# Patient Record
Sex: Female | Born: 1985
Health system: Southern US, Community
[De-identification: ages and names within clinical notes are randomized; demographics above are authoritative.]

## PROBLEM LIST (undated history)

## (undated) DIAGNOSIS — R519 Headache, unspecified: Secondary | ICD-10-CM

## (undated) DIAGNOSIS — R87629 Unspecified abnormal cytological findings in specimens from vagina: Secondary | ICD-10-CM

## (undated) DIAGNOSIS — M1990 Unspecified osteoarthritis, unspecified site: Secondary | ICD-10-CM

## (undated) DIAGNOSIS — F419 Anxiety disorder, unspecified: Secondary | ICD-10-CM

## (undated) HISTORY — DX: Unspecified abnormal cytological findings in specimens from vagina: R87.629

## (undated) HISTORY — DX: Headache, unspecified: R51.9

---

## 2012-08-19 HISTORY — PX: OTHER SURGICAL HISTORY: SHX169

## 2012-08-19 HISTORY — PX: SHOULDER SURGERY: SHX246

## 2013-06-16 DIAGNOSIS — S72009A Fracture of unspecified part of neck of unspecified femur, initial encounter for closed fracture: Secondary | ICD-10-CM | POA: Insufficient documentation

## 2013-06-16 HISTORY — DX: Fracture of unspecified part of neck of unspecified femur, initial encounter for closed fracture: S72.009A

## 2013-06-23 DIAGNOSIS — I82629 Acute embolism and thrombosis of deep veins of unspecified upper extremity: Secondary | ICD-10-CM | POA: Insufficient documentation

## 2014-08-19 NOTE — L&D Delivery Note (Signed)
Delivery Note  SVD viable female Apgars 9,9 over 2nd deg ML laceration.  Placenta delivered spontaneously intact with 3VC. Repair with 2-0 Chromic with good support and hemostasis noted and R/V exam confirms.  PH art was sent .  [place] cord blood was not done.  Mother and baby were doing well.  EBL 250cc  Candice Campavid Kepler Mccabe, MD

## 2015-01-03 LAB — OB RESULTS CONSOLE ANTIBODY SCREEN: Antibody Screen: NEGATIVE

## 2015-01-03 LAB — OB RESULTS CONSOLE RUBELLA ANTIBODY, IGM: Rubella: IMMUNE

## 2015-01-03 LAB — OB RESULTS CONSOLE GC/CHLAMYDIA
Chlamydia: NEGATIVE
Gonorrhea: NEGATIVE

## 2015-01-03 LAB — OB RESULTS CONSOLE HIV ANTIBODY (ROUTINE TESTING): HIV: NONREACTIVE

## 2015-01-03 LAB — OB RESULTS CONSOLE HEPATITIS B SURFACE ANTIGEN: Hepatitis B Surface Ag: NEGATIVE

## 2015-01-03 LAB — OB RESULTS CONSOLE ABO/RH: RH TYPE: NEGATIVE

## 2015-01-03 LAB — OB RESULTS CONSOLE RPR: RPR: NONREACTIVE

## 2015-07-11 ENCOUNTER — Inpatient Hospital Stay (HOSPITAL_COMMUNITY)
Admission: AD | Admit: 2015-07-11 | Discharge: 2015-07-12 | DRG: 775 | Disposition: A | Discharge status: Home / Self Care | Payer: 59 | Source: Ambulatory Visit | Attending: Obstetrics and Gynecology | Admitting: Obstetrics and Gynecology

## 2015-07-11 ENCOUNTER — Encounter (HOSPITAL_COMMUNITY): Payer: Self-pay | Admitting: *Deleted

## 2015-07-11 DIAGNOSIS — M533 Sacrococcygeal disorders, not elsewhere classified: Secondary | ICD-10-CM | POA: Diagnosis present

## 2015-07-11 DIAGNOSIS — Z3A39 39 weeks gestation of pregnancy: Secondary | ICD-10-CM

## 2015-07-11 DIAGNOSIS — IMO0001 Reserved for inherently not codable concepts without codable children: Secondary | ICD-10-CM

## 2015-07-11 LAB — CBC
HEMATOCRIT: 31.9 % — AB (ref 36.0–46.0)
HEMOGLOBIN: 10.7 g/dL — AB (ref 12.0–15.0)
MCH: 29.2 pg (ref 26.0–34.0)
MCHC: 33.5 g/dL (ref 30.0–36.0)
MCV: 87.2 fL (ref 78.0–100.0)
Platelets: 229 10*3/uL (ref 150–400)
RBC: 3.66 MIL/uL — AB (ref 3.87–5.11)
RDW: 14.5 % (ref 11.5–15.5)
WBC: 8.3 10*3/uL (ref 4.0–10.5)

## 2015-07-11 LAB — RPR: RPR Ser Ql: NONREACTIVE

## 2015-07-11 MED ORDER — OXYCODONE-ACETAMINOPHEN 5-325 MG PO TABS: 2.0000 | ORAL_TABLET | ORAL | Status: DC | PRN

## 2015-07-11 MED ORDER — TETANUS-DIPHTH-ACELL PERTUSSIS 5-2.5-18.5 LF-MCG/0.5 IM SUSP: 0.5000 mL | Freq: Once | INTRAMUSCULAR | Status: DC

## 2015-07-11 MED ORDER — DIBUCAINE 1 % RE OINT
1.0000 "application " | TOPICAL_OINTMENT | RECTAL | Status: DC | PRN
  Administered 2015-07-11: 1 via RECTAL
  Filled 2015-07-11: qty 28

## 2015-07-11 MED ORDER — OXYTOCIN BOLUS FROM INFUSION: 500.0000 mL | INTRAVENOUS | Status: DC

## 2015-07-11 MED ORDER — OXYTOCIN 40 UNITS IN LACTATED RINGERS INFUSION - SIMPLE MED
62.5000 mL/h | INTRAVENOUS | Status: DC
  Filled 2015-07-11: qty 1000

## 2015-07-11 MED ORDER — WITCH HAZEL-GLYCERIN EX PADS
1.0000 "application " | MEDICATED_PAD | CUTANEOUS | Status: DC | PRN
  Administered 2015-07-11: 1 via TOPICAL

## 2015-07-11 MED ORDER — SIMETHICONE 80 MG PO CHEW: 80.0000 mg | CHEWABLE_TABLET | ORAL | Status: DC | PRN

## 2015-07-11 MED ORDER — LACTATED RINGERS IV SOLN: 500.0000 mL | INTRAVENOUS | Status: DC | PRN

## 2015-07-11 MED ORDER — LACTATED RINGERS IV SOLN: INTRAVENOUS | Status: DC

## 2015-07-11 MED ORDER — CITRIC ACID-SODIUM CITRATE 334-500 MG/5ML PO SOLN
30.0000 mL | ORAL | Status: DC | PRN
  Administered 2015-07-11: 30 mL via ORAL
  Filled 2015-07-11: qty 15

## 2015-07-11 MED ORDER — FLEET ENEMA 7-19 GM/118ML RE ENEM: 1.0000 | ENEMA | RECTAL | Status: DC | PRN

## 2015-07-11 MED ORDER — LIDOCAINE HCL (PF) 1 % IJ SOLN
30.0000 mL | INTRAMUSCULAR | Status: DC | PRN
  Administered 2015-07-11: 30 mL via SUBCUTANEOUS
  Filled 2015-07-11: qty 30

## 2015-07-11 MED ORDER — LANOLIN HYDROUS EX OINT: TOPICAL_OINTMENT | CUTANEOUS | Status: DC | PRN

## 2015-07-11 MED ORDER — NALOXONE HCL 0.4 MG/ML IJ SOLN
INTRAMUSCULAR | Status: AC
  Filled 2015-07-11: qty 1

## 2015-07-11 MED ORDER — DIPHENHYDRAMINE HCL 25 MG PO CAPS: 25.0000 mg | ORAL_CAPSULE | Freq: Four times a day (QID) | ORAL | Status: DC | PRN

## 2015-07-11 MED ORDER — PHENYLEPHRINE 40 MCG/ML (10ML) SYRINGE FOR IV PUSH (FOR BLOOD PRESSURE SUPPORT)
PREFILLED_SYRINGE | INTRAVENOUS | Status: AC
  Filled 2015-07-11: qty 20

## 2015-07-11 MED ORDER — BENZOCAINE-MENTHOL 20-0.5 % EX AERO
1.0000 "application " | INHALATION_SPRAY | CUTANEOUS | Status: DC | PRN
  Administered 2015-07-11: 1 via TOPICAL
  Filled 2015-07-11: qty 56

## 2015-07-11 MED ORDER — SENNOSIDES-DOCUSATE SODIUM 8.6-50 MG PO TABS
2.0000 | ORAL_TABLET | ORAL | Status: DC
  Administered 2015-07-12: 2 via ORAL
  Filled 2015-07-11: qty 2

## 2015-07-11 MED ORDER — OXYCODONE-ACETAMINOPHEN 5-325 MG PO TABS
1.0000 | ORAL_TABLET | ORAL | Status: DC | PRN
  Administered 2015-07-11 – 2015-07-12 (×3): 1 via ORAL
  Filled 2015-07-11 (×3): qty 1

## 2015-07-11 MED ORDER — IBUPROFEN 600 MG PO TABS
600.0000 mg | ORAL_TABLET | Freq: Four times a day (QID) | ORAL | Status: DC
  Administered 2015-07-11 – 2015-07-12 (×5): 600 mg via ORAL
  Filled 2015-07-11 (×5): qty 1

## 2015-07-11 MED ORDER — ACETAMINOPHEN 325 MG PO TABS: 650.0000 mg | ORAL_TABLET | ORAL | Status: DC | PRN

## 2015-07-11 MED ORDER — FENTANYL 2.5 MCG/ML BUPIVACAINE 1/10 % EPIDURAL INFUSION (WH - ANES)
INTRAMUSCULAR | Status: AC
  Filled 2015-07-11: qty 125

## 2015-07-11 MED ORDER — PRENATAL MULTIVITAMIN CH
1.0000 | ORAL_TABLET | Freq: Every day | ORAL | Status: DC
  Administered 2015-07-11 – 2015-07-12 (×2): 1 via ORAL
  Filled 2015-07-11 (×2): qty 1

## 2015-07-11 MED ORDER — MEDROXYPROGESTERONE ACETATE 150 MG/ML IM SUSP: 150.0000 mg | INTRAMUSCULAR | Status: DC | PRN

## 2015-07-11 MED ORDER — MEASLES, MUMPS & RUBELLA VAC ~~LOC~~ INJ
0.5000 mL | INJECTION | Freq: Once | SUBCUTANEOUS | Status: DC
  Filled 2015-07-11: qty 0.5

## 2015-07-11 MED ORDER — OXYCODONE-ACETAMINOPHEN 5-325 MG PO TABS: 1.0000 | ORAL_TABLET | ORAL | Status: DC | PRN

## 2015-07-11 MED ORDER — ONDANSETRON HCL 4 MG/2ML IJ SOLN: 4.0000 mg | Freq: Four times a day (QID) | INTRAMUSCULAR | Status: DC | PRN

## 2015-07-11 MED ORDER — ONDANSETRON HCL 4 MG/2ML IJ SOLN: 4.0000 mg | INTRAMUSCULAR | Status: DC | PRN

## 2015-07-11 MED ORDER — ONDANSETRON HCL 4 MG PO TABS: 4.0000 mg | ORAL_TABLET | ORAL | Status: DC | PRN

## 2015-07-11 MED ORDER — BUTORPHANOL TARTRATE 1 MG/ML IJ SOLN
2.0000 mg | Freq: Once | INTRAMUSCULAR | Status: AC
  Administered 2015-07-11: 2 mg via INTRAVENOUS
  Filled 2015-07-11 (×2): qty 2

## 2015-07-11 MED ORDER — ZOLPIDEM TARTRATE 5 MG PO TABS: 5.0000 mg | ORAL_TABLET | Freq: Every evening | ORAL | Status: DC | PRN

## 2015-07-11 NOTE — H&P (Signed)
Tiffany Leonard is a 29 y.o. female presenting for SROM and labor.  8 cm in triage to L&D.  Preg uncomplicated.  GBS-. History OB History    Gravida Para Term Preterm AB TAB SAB Ectopic Multiple Living   2              History reviewed. No pertinent past medical history. History reviewed. No pertinent past surgical history. Family History: family history is not on file. Social History:  has no tobacco, alcohol, and drug history on file.   Prenatal Transfer Tool  Maternal Diabetes: No Genetic Screening: Normal Maternal Ultrasounds/Referrals: Normal Fetal Ultrasounds or other Referrals:  None Maternal Substance Abuse:  No Significant Maternal Medications:  None Significant Maternal Lab Results:  None Other Comments:  None  ROS  Dilation: 10 Effacement (%): 100 Station: 0 Exam by:: Freddi CheMelissa Merrill, RN Blood pressure 129/86, pulse 98, temperature 98.8 F (37.1 C), temperature source Oral, resp. rate 20. Exam Physical Exam  Prenatal labs: ABO, Rh: --/--/AB NEG (11/22 0430) Antibody: POS (11/22 0430) Rubella: Immune (05/17 0000) RPR: Nonreactive (05/17 0000)  HBsAg: Negative (05/17 0000)  HIV: Non-reactive (05/17 0000)  GBS:     Assessment/Plan: IUP at term Active labor Anticipate SVD   [name] 07/11/2015, 6:42 AM

## 2015-07-11 NOTE — Lactation Note (Signed)
This note was copied from the chart of Tiffany Leonard. Lactation Consultation Note  Patient Name: Tiffany Leonard RUEAV'WToday's Date: 07/11/2015 Reason for consult: Initial assessment   With this first time om and term baby. Baby now 7 hours old, and mom states she has successfully breast fed at least once. The baby was wrapped and asleepin his crib, and mom said she was getting ready to nap, but her grandparents were visiting at this time. Some basic breast feeding teaching done with mom, and I asked that she call when baby next shows feeding cues, to I can observe a latch.   Maternal Data Formula Feeding for Exclusion: No Has patient been taught Hand Expression?: Yes Does the patient have breastfeeding experience prior to this delivery?: No  Feeding Feeding Type: Breast Fed Length of feed: 20 min  LATCH Score/Interventions                      Lactation Tools Discussed/Used     Consult Status Consult Status: Follow-up Date: 07/11/15 Follow-up type: In-patient    Alfred LevinsLee, Jorey Dollard Anne 07/11/2015, 1:47 PM

## 2015-07-11 NOTE — MAU Note (Signed)
PT  SAYS SROM   AT 0345.  WAS  2  CM IN  OFFICE,  DENIES HSV AND MRSA.    GBS- NEG

## 2015-07-12 LAB — CBC
HCT: 25.4 % — ABNORMAL LOW (ref 36.0–46.0)
HEMOGLOBIN: 8.5 g/dL — AB (ref 12.0–15.0)
MCH: 29.5 pg (ref 26.0–34.0)
MCHC: 33.5 g/dL (ref 30.0–36.0)
MCV: 88.2 fL (ref 78.0–100.0)
Platelets: 202 10*3/uL (ref 150–400)
RBC: 2.88 MIL/uL — ABNORMAL LOW (ref 3.87–5.11)
RDW: 14.6 % (ref 11.5–15.5)
WBC: 10 10*3/uL (ref 4.0–10.5)

## 2015-07-12 MED ORDER — IBUPROFEN 600 MG PO TABS: 600.0000 mg | ORAL_TABLET | Freq: Four times a day (QID) | ORAL | Status: DC

## 2015-07-12 MED ORDER — OXYCODONE-ACETAMINOPHEN 5-325 MG PO TABS: 1.0000 | ORAL_TABLET | ORAL | Status: DC | PRN

## 2015-07-12 NOTE — Lactation Note (Signed)
This note was copied from the chart of Tiffany Leonard. Lactation Consultation Note  Patient Name: Tiffany Leonard ZOXWR'UToday's Date: 07/12/2015 Reason for consult: Follow-up assessment;Pump rental   [facility]. Handout on Marijuana in Pregnancy and Breastfeeding given to mom and discussed detriment to infant who receives Marijuana by inhalation or BM. Mom voiced understanding. Infant did have a + UDS for Barbituates, Spoke with SW who was aware, it is noted that mom received Furocet prior to delivery, hence the + drug screen. Enc mom to call with questions/concerns. Infant with F/U Ped appt Friday and WIC on Monday.    Maternal Data Formula Feeding for Exclusion: No Has patient been taught Hand Expression?: Yes  Feeding    LATCH Score/Interventions                      Lactation Tools Discussed/Used WIC Program: Yes Pump Review: Setup, frequency, and cleaning;Milk Storage   Consult Status Consult Status: Complete Follow-up type: Call as needed    Ed BlalockSharon S Daniel Ritthaler 07/12/2015, 12:10 PM

## 2015-07-12 NOTE — Discharge Summary (Signed)
Obstetric Discharge Summary Reason for Admission: onset of labor and rupture of membranes Prenatal Procedures: ultrasound Intrapartum Procedures: spontaneous vaginal delivery Postpartum Procedures: none Complications-Operative and Postpartum: 2 degree perineal laceration HEMOGLOBIN  Date Value Ref Range Status  07/12/2015 8.5* 12.0 - 15.0 Leonard/dL Final    Comment:    DELTA CHECK NOTED REPEATED TO VERIFY SPECIMEN CHECKED FOR CLOTS    HCT  Date Value Ref Range Status  07/12/2015 25.4* 36.0 - 46.0 % Final    Physical Exam:  General: alert and cooperative, complains of coccygeal pain Lochia: appropriate Uterine Fundus: firm Incision: healing well DVT Evaluation: No evidence of DVT seen on physical exam. Negative Homan's sign. No cords or calf tenderness. No significant calf/ankle edema. No ecchymosis or erythema observed at coccyx Discharge Diagnoses: Term Pregnancy-delivered  Discharge Information: Date: 07/12/2015 Activity: pelvic rest Diet: routine Medications: PNV, Ibuprofen and Percocet Condition: stable Instructions: refer to practice specific booklet Discharge to: home   Newborn Data: Live born female  Birth Weight: 7 lb 15 oz (3600 Leonard) APGAR: 9, 9  Home with mother.  Tiffany Leonard 07/12/2015, 8:56 AM

## 2015-07-12 NOTE — Lactation Note (Signed)
This note was copied from the chart of Tiffany Leonard. Lactation Consultation Note  Patient Name: Tiffany Leonard HQION'GToday's Date: 07/12/2015 Reason for consult: Difficult latch MD requested latch help with the R breast. Mom does have a compression stripe on the top edge of the R nipple. She was attempting a cradle position and baby was getting fussy. Moved baby to a more sitting up football like position and he latched more easily until he fell asleep. Reviewed baby behavior and stressed that more practice will make this better. Mom declined making an OP apt today. She stated that she will call if she has problems. Discussed nipple care, breast changes, and feeding frequency. She does have a DEBP at home.    Maternal Data    Feeding Feeding Type: Breast Fed Length of feed: 15 min (on and off)  LATCH Score/Interventions                      Lactation Tools Discussed/Used     Consult Status Consult Status: Complete Date: 07/12/15 Follow-up type: Call as needed    Rulon Eisenmengerlizabeth E Orlander [place] 07/12/2015, 4:25 PM

## 2015-07-12 NOTE — Lactation Note (Signed)
This note was copied from the chart of Tiffany Leonard. Lactation Consultation Note  Patient Name: Tiffany Leonard WJXBJ'YToday's Date: 07/12/2015 Reason for consult: Follow-up assessment   Follow up with mom of 28 hour old infant. Infant with 7 BF for 15-50 minutes, 4 BF attempts, 3 voids, and 3 stools in last 24 hours. Latch scores of 7-8. Infant has F/U with Ped on Friday. Infant circed this am and currently sound asleep. Discussed milk coming to volume, feeding 8-12 x in 24 hours, maintaining I/O record and taking to Ped visit, Engorgement prevention. Mom reports he has more trouble latching onto right breasts but is improving. [facility] Brochure reviewed including Phone #, OP Services, and Support Groups. Mom has a DEBP at home for prn use. Enc mom to call with questions/concerns.   Maternal Data Formula Feeding for Exclusion: No Has patient been taught Hand Expression?: Yes  Feeding    LATCH Score/Interventions                      Lactation Tools Discussed/Used WIC Program: No   Consult Status Consult Status: PRN Follow-up type: Call as needed    Ed BlalockSharon S Oreste Majeed 07/12/2015, 10:49 AM

## 2015-07-15 LAB — TYPE AND SCREEN
ABO/RH(D): AB NEG
Antibody Screen: POSITIVE
DAT, IGG: NEGATIVE
UNIT DIVISION: 0
UNIT DIVISION: 0

## 2015-07-28 ENCOUNTER — Ambulatory Visit (HOSPITAL_COMMUNITY)
Admission: RE | Admit: 2015-07-28 | Discharge: 2015-07-28 | Disposition: A | Discharge status: Home / Self Care | Payer: 59 | Source: Ambulatory Visit | Attending: Obstetrics & Gynecology | Admitting: Obstetrics & Gynecology

## 2018-11-12 DIAGNOSIS — Z01419 Encounter for gynecological examination (general) (routine) without abnormal findings: Secondary | ICD-10-CM | POA: Diagnosis not present

## 2018-11-12 DIAGNOSIS — M25551 Pain in right hip: Secondary | ICD-10-CM | POA: Diagnosis not present

## 2018-11-12 DIAGNOSIS — F419 Anxiety disorder, unspecified: Secondary | ICD-10-CM | POA: Diagnosis not present

## 2018-11-12 DIAGNOSIS — Z6826 Body mass index (BMI) 26.0-26.9, adult: Secondary | ICD-10-CM | POA: Diagnosis not present

## 2018-12-24 DIAGNOSIS — M5441 Lumbago with sciatica, right side: Secondary | ICD-10-CM | POA: Diagnosis not present

## 2018-12-24 DIAGNOSIS — M545 Low back pain: Secondary | ICD-10-CM | POA: Diagnosis not present

## 2019-01-22 DIAGNOSIS — Z30432 Encounter for removal of intrauterine contraceptive device: Secondary | ICD-10-CM | POA: Diagnosis not present

## 2019-02-04 DIAGNOSIS — M25551 Pain in right hip: Secondary | ICD-10-CM | POA: Diagnosis not present

## 2019-02-04 DIAGNOSIS — M25559 Pain in unspecified hip: Secondary | ICD-10-CM | POA: Diagnosis not present

## 2019-02-10 ENCOUNTER — Other Ambulatory Visit (HOSPITAL_COMMUNITY): Payer: Self-pay | Admitting: Orthopedic Surgery

## 2019-02-10 ENCOUNTER — Other Ambulatory Visit: Payer: Self-pay | Admitting: Orthopedic Surgery

## 2019-02-10 DIAGNOSIS — R102 Pelvic and perineal pain: Secondary | ICD-10-CM | POA: Diagnosis not present

## 2019-02-10 DIAGNOSIS — M85459 Solitary bone cyst, unspecified pelvis: Secondary | ICD-10-CM | POA: Diagnosis not present

## 2019-02-15 ENCOUNTER — Ambulatory Visit (HOSPITAL_COMMUNITY)
Admission: RE | Admit: 2019-02-15 | Discharge: 2019-02-15 | Disposition: A | Discharge status: Home / Self Care | Payer: BLUE CROSS/BLUE SHIELD | Source: Ambulatory Visit | Attending: Orthopedic Surgery | Admitting: Orthopedic Surgery

## 2019-02-15 ENCOUNTER — Other Ambulatory Visit: Payer: Self-pay

## 2019-02-15 DIAGNOSIS — R102 Pelvic and perineal pain: Secondary | ICD-10-CM | POA: Diagnosis not present

## 2019-02-15 DIAGNOSIS — M85459 Solitary bone cyst, unspecified pelvis: Secondary | ICD-10-CM | POA: Diagnosis not present

## 2019-02-15 MED ORDER — IOHEXOL 300 MG/ML  SOLN
100.0000 mL | Freq: Once | INTRAMUSCULAR | Status: AC | PRN
  Administered 2019-02-15: 100 mL via INTRAVENOUS

## 2019-02-18 DIAGNOSIS — M25551 Pain in right hip: Secondary | ICD-10-CM | POA: Diagnosis not present

## 2019-03-22 DIAGNOSIS — M533 Sacrococcygeal disorders, not elsewhere classified: Secondary | ICD-10-CM | POA: Diagnosis not present

## 2019-05-07 DIAGNOSIS — N911 Secondary amenorrhea: Secondary | ICD-10-CM | POA: Diagnosis not present

## 2019-05-13 DIAGNOSIS — Z3685 Encounter for antenatal screening for Streptococcus B: Secondary | ICD-10-CM | POA: Diagnosis not present

## 2019-05-13 DIAGNOSIS — Z3481 Encounter for supervision of other normal pregnancy, first trimester: Secondary | ICD-10-CM | POA: Diagnosis not present

## 2019-05-13 LAB — OB RESULTS CONSOLE RPR: RPR: NONREACTIVE

## 2019-05-13 LAB — OB RESULTS CONSOLE ABO/RH: RH Type: NEGATIVE

## 2019-05-13 LAB — OB RESULTS CONSOLE HIV ANTIBODY (ROUTINE TESTING): HIV: NONREACTIVE

## 2019-05-13 LAB — OB RESULTS CONSOLE RUBELLA ANTIBODY, IGM: Rubella: IMMUNE

## 2019-05-13 LAB — OB RESULTS CONSOLE HEPATITIS B SURFACE ANTIGEN: Hepatitis B Surface Ag: NEGATIVE

## 2019-05-24 DIAGNOSIS — Z3400 Encounter for supervision of normal first pregnancy, unspecified trimester: Secondary | ICD-10-CM | POA: Diagnosis not present

## 2019-05-24 DIAGNOSIS — Z23 Encounter for immunization: Secondary | ICD-10-CM | POA: Diagnosis not present

## 2019-05-24 DIAGNOSIS — Z113 Encounter for screening for infections with a predominantly sexual mode of transmission: Secondary | ICD-10-CM | POA: Diagnosis not present

## 2019-06-07 DIAGNOSIS — Z36 Encounter for antenatal screening for chromosomal anomalies: Secondary | ICD-10-CM | POA: Diagnosis not present

## 2019-06-07 DIAGNOSIS — Z3A12 12 weeks gestation of pregnancy: Secondary | ICD-10-CM | POA: Diagnosis not present

## 2019-06-07 DIAGNOSIS — Z3682 Encounter for antenatal screening for nuchal translucency: Secondary | ICD-10-CM | POA: Diagnosis not present

## 2019-07-09 DIAGNOSIS — Z3A17 17 weeks gestation of pregnancy: Secondary | ICD-10-CM | POA: Diagnosis not present

## 2019-07-09 DIAGNOSIS — Z363 Encounter for antenatal screening for malformations: Secondary | ICD-10-CM | POA: Diagnosis not present

## 2019-08-20 NOTE — L&D Delivery Note (Signed)
Delivery Note At 6:38 PM a viable female was delivered via Vaginal, Spontaneous (Presentation:   Occiput Anterior).  APGAR: 8, 9; weight pending.   Placenta status: Spontaneous, Intact.  Cord: 3 vessels with the following complications: None.  Cord pH: n/a  Anesthesia: Local Episiotomy:  n/a Lacerations:  2nd degree Suture Repair: 3.0 vicryl rapide Est. Blood Loss (mL):  200  Mom to postpartum.  Baby to Couplet care / Skin to Skin.  Mitchel Honour 12/16/2019, 7:00 PM

## 2019-09-06 DIAGNOSIS — Z3A25 25 weeks gestation of pregnancy: Secondary | ICD-10-CM | POA: Diagnosis not present

## 2019-09-06 DIAGNOSIS — Z3480 Encounter for supervision of other normal pregnancy, unspecified trimester: Secondary | ICD-10-CM | POA: Diagnosis not present

## 2019-09-06 DIAGNOSIS — Z23 Encounter for immunization: Secondary | ICD-10-CM | POA: Diagnosis not present

## 2019-09-28 DIAGNOSIS — Z3483 Encounter for supervision of other normal pregnancy, third trimester: Secondary | ICD-10-CM | POA: Diagnosis not present

## 2019-09-28 DIAGNOSIS — Z3482 Encounter for supervision of other normal pregnancy, second trimester: Secondary | ICD-10-CM | POA: Diagnosis not present

## 2019-10-08 DIAGNOSIS — Z3480 Encounter for supervision of other normal pregnancy, unspecified trimester: Secondary | ICD-10-CM | POA: Diagnosis not present

## 2019-10-20 DIAGNOSIS — Z3480 Encounter for supervision of other normal pregnancy, unspecified trimester: Secondary | ICD-10-CM | POA: Diagnosis not present

## 2019-10-20 DIAGNOSIS — R768 Other specified abnormal immunological findings in serum: Secondary | ICD-10-CM | POA: Diagnosis not present

## 2019-10-20 LAB — OB RESULTS CONSOLE ANTIBODY SCREEN: Antibody Screen: POSITIVE

## 2019-11-25 DIAGNOSIS — R609 Edema, unspecified: Secondary | ICD-10-CM | POA: Diagnosis not present

## 2019-11-29 DIAGNOSIS — Z3685 Encounter for antenatal screening for Streptococcus B: Secondary | ICD-10-CM | POA: Diagnosis not present

## 2019-11-29 DIAGNOSIS — Z3480 Encounter for supervision of other normal pregnancy, unspecified trimester: Secondary | ICD-10-CM | POA: Diagnosis not present

## 2019-11-29 DIAGNOSIS — M5489 Other dorsalgia: Secondary | ICD-10-CM | POA: Diagnosis not present

## 2019-11-29 LAB — OB RESULTS CONSOLE GBS: GBS: NEGATIVE

## 2019-12-14 ENCOUNTER — Telehealth (HOSPITAL_COMMUNITY): Payer: Self-pay | Admitting: *Deleted

## 2019-12-14 ENCOUNTER — Encounter (HOSPITAL_COMMUNITY): Payer: Self-pay | Admitting: *Deleted

## 2019-12-14 NOTE — Telephone Encounter (Signed)
Preadmission screen  

## 2019-12-15 ENCOUNTER — Other Ambulatory Visit (HOSPITAL_COMMUNITY)
Admission: RE | Admit: 2019-12-15 | Discharge: 2019-12-15 | Disposition: A | Discharge status: Home / Self Care | Payer: BC Managed Care – PPO | Source: Ambulatory Visit | Attending: Obstetrics & Gynecology | Admitting: Obstetrics & Gynecology

## 2019-12-15 DIAGNOSIS — O99344 Other mental disorders complicating childbirth: Secondary | ICD-10-CM | POA: Diagnosis not present

## 2019-12-15 DIAGNOSIS — O26893 Other specified pregnancy related conditions, third trimester: Secondary | ICD-10-CM | POA: Diagnosis not present

## 2019-12-15 DIAGNOSIS — F419 Anxiety disorder, unspecified: Secondary | ICD-10-CM | POA: Diagnosis not present

## 2019-12-15 DIAGNOSIS — Z3403 Encounter for supervision of normal first pregnancy, third trimester: Secondary | ICD-10-CM

## 2019-12-15 DIAGNOSIS — Z3A40 40 weeks gestation of pregnancy: Secondary | ICD-10-CM | POA: Diagnosis not present

## 2019-12-15 HISTORY — DX: Encounter for supervision of normal first pregnancy, third trimester: Z34.03

## 2019-12-15 LAB — SARS CORONAVIRUS 2 (TAT 6-24 HRS): SARS Coronavirus 2: NEGATIVE

## 2019-12-16 ENCOUNTER — Inpatient Hospital Stay (HOSPITAL_COMMUNITY)
Admission: RE | Admit: 2019-12-16 | Discharge: 2019-12-17 | DRG: 807 | Disposition: A | Discharge status: Home / Self Care | Payer: BC Managed Care – PPO | Attending: Obstetrics & Gynecology | Admitting: Obstetrics & Gynecology

## 2019-12-16 ENCOUNTER — Inpatient Hospital Stay (HOSPITAL_COMMUNITY)
Admission: AD | Admit: 2019-12-16 | Payer: BC Managed Care – PPO | Source: Home / Self Care | Admitting: Obstetrics & Gynecology

## 2019-12-16 ENCOUNTER — Inpatient Hospital Stay (HOSPITAL_COMMUNITY): Payer: BC Managed Care – PPO

## 2019-12-16 ENCOUNTER — Encounter (HOSPITAL_COMMUNITY): Payer: Self-pay | Admitting: Obstetrics & Gynecology

## 2019-12-16 ENCOUNTER — Other Ambulatory Visit: Payer: Self-pay

## 2019-12-16 DIAGNOSIS — O99344 Other mental disorders complicating childbirth: Secondary | ICD-10-CM | POA: Diagnosis present

## 2019-12-16 DIAGNOSIS — Z3A40 40 weeks gestation of pregnancy: Secondary | ICD-10-CM | POA: Diagnosis not present

## 2019-12-16 DIAGNOSIS — Z3403 Encounter for supervision of normal first pregnancy, third trimester: Secondary | ICD-10-CM

## 2019-12-16 DIAGNOSIS — F419 Anxiety disorder, unspecified: Secondary | ICD-10-CM | POA: Diagnosis present

## 2019-12-16 DIAGNOSIS — Z3490 Encounter for supervision of normal pregnancy, unspecified, unspecified trimester: Secondary | ICD-10-CM

## 2019-12-16 DIAGNOSIS — O26893 Other specified pregnancy related conditions, third trimester: Secondary | ICD-10-CM | POA: Diagnosis present

## 2019-12-16 HISTORY — DX: Anxiety disorder, unspecified: F41.9

## 2019-12-16 HISTORY — DX: Encounter for supervision of normal pregnancy, unspecified, unspecified trimester: Z34.90

## 2019-12-16 LAB — CBC
HCT: 34.9 % — ABNORMAL LOW (ref 36.0–46.0)
Hemoglobin: 11.4 g/dL — ABNORMAL LOW (ref 12.0–15.0)
MCH: 28.4 pg (ref 26.0–34.0)
MCHC: 32.7 g/dL (ref 30.0–36.0)
MCV: 86.8 fL (ref 80.0–100.0)
Platelets: 238 10*3/uL (ref 150–400)
RBC: 4.02 MIL/uL (ref 3.87–5.11)
RDW: 14.6 % (ref 11.5–15.5)
WBC: 8.4 10*3/uL (ref 4.0–10.5)
nRBC: 0 % (ref 0.0–0.2)

## 2019-12-16 MED ORDER — PHENYLEPHRINE 40 MCG/ML (10ML) SYRINGE FOR IV PUSH (FOR BLOOD PRESSURE SUPPORT): 80.0000 ug | PREFILLED_SYRINGE | INTRAVENOUS | Status: DC | PRN

## 2019-12-16 MED ORDER — LACTATED RINGERS IV SOLN: INTRAVENOUS | Status: DC

## 2019-12-16 MED ORDER — OXYCODONE-ACETAMINOPHEN 5-325 MG PO TABS: 2.0000 | ORAL_TABLET | ORAL | Status: DC | PRN

## 2019-12-16 MED ORDER — ACETAMINOPHEN 325 MG PO TABS
650.0000 mg | ORAL_TABLET | ORAL | Status: DC | PRN
  Administered 2019-12-16: 15:00:00 650 mg via ORAL
  Filled 2019-12-16: qty 2

## 2019-12-16 MED ORDER — FENTANYL CITRATE (PF) 100 MCG/2ML IJ SOLN
50.0000 ug | INTRAMUSCULAR | Status: DC | PRN
  Filled 2019-12-16: qty 2

## 2019-12-16 MED ORDER — LIDOCAINE HCL (PF) 1 % IJ SOLN
30.0000 mL | INTRAMUSCULAR | Status: AC | PRN
  Administered 2019-12-16: 30 mL via SUBCUTANEOUS
  Filled 2019-12-16: qty 30

## 2019-12-16 MED ORDER — ZOLPIDEM TARTRATE 5 MG PO TABS: 5.0000 mg | ORAL_TABLET | Freq: Every evening | ORAL | Status: DC | PRN

## 2019-12-16 MED ORDER — OXYTOCIN BOLUS FROM INFUSION: 500.0000 mL | Freq: Once | INTRAVENOUS | Status: DC

## 2019-12-16 MED ORDER — EPHEDRINE 5 MG/ML INJ: 10.0000 mg | INTRAVENOUS | Status: DC | PRN

## 2019-12-16 MED ORDER — SIMETHICONE 80 MG PO CHEW: 80.0000 mg | CHEWABLE_TABLET | ORAL | Status: DC | PRN

## 2019-12-16 MED ORDER — OXYTOCIN 40 UNITS IN NORMAL SALINE INFUSION - SIMPLE MED
1.0000 m[IU]/min | INTRAVENOUS | Status: DC
  Administered 2019-12-16: 2 m[IU]/min via INTRAVENOUS
  Filled 2019-12-16: qty 1000

## 2019-12-16 MED ORDER — FENTANYL-BUPIVACAINE-NACL 0.5-0.125-0.9 MG/250ML-% EP SOLN: 12.0000 mL/h | EPIDURAL | Status: DC | PRN

## 2019-12-16 MED ORDER — IBUPROFEN 600 MG PO TABS
600.0000 mg | ORAL_TABLET | Freq: Four times a day (QID) | ORAL | Status: DC
  Administered 2019-12-16 – 2019-12-17 (×4): 600 mg via ORAL
  Filled 2019-12-16 (×4): qty 1

## 2019-12-16 MED ORDER — ONDANSETRON HCL 4 MG PO TABS: 4.0000 mg | ORAL_TABLET | ORAL | Status: DC | PRN

## 2019-12-16 MED ORDER — DIBUCAINE (PERIANAL) 1 % EX OINT: 1.0000 "application " | TOPICAL_OINTMENT | CUTANEOUS | Status: DC | PRN

## 2019-12-16 MED ORDER — SENNOSIDES-DOCUSATE SODIUM 8.6-50 MG PO TABS
2.0000 | ORAL_TABLET | ORAL | Status: DC
  Administered 2019-12-16: 23:00:00 2 via ORAL
  Filled 2019-12-16: qty 2

## 2019-12-16 MED ORDER — SOD CITRATE-CITRIC ACID 500-334 MG/5ML PO SOLN
30.0000 mL | ORAL | Status: DC | PRN
  Administered 2019-12-16 (×2): 30 mL via ORAL
  Filled 2019-12-16 (×2): qty 30

## 2019-12-16 MED ORDER — DIPHENHYDRAMINE HCL 25 MG PO CAPS: 25.0000 mg | ORAL_CAPSULE | Freq: Four times a day (QID) | ORAL | Status: DC | PRN

## 2019-12-16 MED ORDER — WITCH HAZEL-GLYCERIN EX PADS: 1.0000 "application " | MEDICATED_PAD | CUTANEOUS | Status: DC | PRN

## 2019-12-16 MED ORDER — OXYTOCIN 40 UNITS IN NORMAL SALINE INFUSION - SIMPLE MED: 2.5000 [IU]/h | INTRAVENOUS | Status: DC

## 2019-12-16 MED ORDER — LACTATED RINGERS IV SOLN: 500.0000 mL | INTRAVENOUS | Status: DC | PRN

## 2019-12-16 MED ORDER — PRENATAL MULTIVITAMIN CH
1.0000 | ORAL_TABLET | Freq: Every day | ORAL | Status: DC
  Administered 2019-12-17: 1 via ORAL
  Filled 2019-12-16: qty 1

## 2019-12-16 MED ORDER — BENZOCAINE-MENTHOL 20-0.5 % EX AERO
1.0000 "application " | INHALATION_SPRAY | CUTANEOUS | Status: DC | PRN
  Administered 2019-12-16: 1 via TOPICAL
  Filled 2019-12-16 (×2): qty 56

## 2019-12-16 MED ORDER — TERBUTALINE SULFATE 1 MG/ML IJ SOLN: 0.2500 mg | Freq: Once | INTRAMUSCULAR | Status: DC | PRN

## 2019-12-16 MED ORDER — ONDANSETRON HCL 4 MG/2ML IJ SOLN: 4.0000 mg | INTRAMUSCULAR | Status: DC | PRN

## 2019-12-16 MED ORDER — LACTATED RINGERS IV SOLN: 500.0000 mL | Freq: Once | INTRAVENOUS | Status: DC

## 2019-12-16 MED ORDER — OXYCODONE-ACETAMINOPHEN 5-325 MG PO TABS: 1.0000 | ORAL_TABLET | ORAL | Status: DC | PRN

## 2019-12-16 MED ORDER — DIPHENHYDRAMINE HCL 50 MG/ML IJ SOLN: 12.5000 mg | INTRAMUSCULAR | Status: DC | PRN

## 2019-12-16 MED ORDER — TETANUS-DIPHTH-ACELL PERTUSSIS 5-2.5-18.5 LF-MCG/0.5 IM SUSP: 0.5000 mL | Freq: Once | INTRAMUSCULAR | Status: DC

## 2019-12-16 MED ORDER — COCONUT OIL OIL: 1.0000 "application " | TOPICAL_OIL | Status: DC | PRN

## 2019-12-16 MED ORDER — ACETAMINOPHEN 325 MG PO TABS
650.0000 mg | ORAL_TABLET | ORAL | Status: DC | PRN
  Administered 2019-12-16: 22:00:00 650 mg via ORAL
  Filled 2019-12-16: qty 2

## 2019-12-16 MED ORDER — ONDANSETRON HCL 4 MG/2ML IJ SOLN: 4.0000 mg | Freq: Four times a day (QID) | INTRAMUSCULAR | Status: DC | PRN

## 2019-12-16 MED ORDER — SERTRALINE HCL 100 MG PO TABS
100.0000 mg | ORAL_TABLET | Freq: Every day | ORAL | Status: DC
  Administered 2019-12-17: 100 mg via ORAL
  Filled 2019-12-16: qty 1

## 2019-12-16 NOTE — H&P (Signed)
Tiffany Leonard is a 34 y.o. female Gg2P1001 at [redacted]w[redacted]d presenting for elective IOL.  Antepartum course complicated by anxiety well controlled on sertraline 100 mg daily.  GBS negative.   OB History    Gravida  3   Para  1   Term  1   Preterm      AB      Living  1     SAB      TAB      Ectopic      Multiple  0   Live Births  1          Past Medical History:  Diagnosis Date  . Medical history non-contributory    Past Surgical History:  Procedure Laterality Date  . broken pelvis  2014  . SHOULDER SURGERY  2014   Family History: family history is not on file. Social History:  reports that she has never smoked. She has never used smokeless tobacco. She reports previous alcohol use. She reports that she does not use drugs.     Maternal Diabetes: No Genetic Screening: Normal Maternal Ultrasounds/Referrals: Normal Fetal Ultrasounds or other Referrals:  None Maternal Substance Abuse:  No Significant Maternal Medications:  Meds include: Zoloft Significant Maternal Lab Results:  Group B Strep negative Other Comments:  None  Review of Systems Maternal Medical History:  Fetal activity: Perceived fetal activity is normal.   Last perceived fetal movement was within the past 24 hours.    Prenatal complications: no prenatal complications Prenatal Complications - Diabetes: none.    Dilation: 3 Effacement (%): 70 Station: -2 Exam by:: J.Cox, RN Blood pressure 134/84, pulse 81, temperature 99.1 F (37.3 C), temperature source Oral, height 5\' 10"  (1.778 m), weight 95.3 kg, unknown if currently breastfeeding. Maternal Exam:  Uterine Assessment: Contraction strength is mild.  Contraction frequency is rare.   Abdomen: Patient reports no abdominal tenderness. Fundal height is c/w dates.   Estimated fetal weight is 8#6.       Fetal Exam Fetal Monitor Review: Baseline rate: 130.  Variability: moderate (6-25 bpm).   Pattern: accelerations present and  no decelerations.    Fetal State Assessment: Category I - tracings are normal.     Physical Exam  Constitutional: She is oriented to person, place, and time. She appears well-developed and well-nourished.  Respiratory: Effort normal.  GI: Soft. There is no rebound and no guarding.  Musculoskeletal:        General: Normal range of motion.  Neurological: She is alert and oriented to person, place, and time.  Skin: Skin is warm and dry.  Psychiatric: She has a normal mood and affect. Her behavior is normal.    Prenatal labs: ABO, Rh: --/--/AB NEG (04/29 1100) Antibody: PENDING (04/29 1100) Rubella: Immune (09/24 0000) RPR: Nonreactive (09/24 0000)  HBsAg: Negative (09/24 0000)  HIV: Non-reactive (09/24 0000)  GBS: Negative/-- (04/12 0000)   Assessment/Plan: 33yo G2P1001 at [redacted]w[redacted]d for IOL -Pitocin -Patient declined AROM until feeling CTX -Planning natural labor -Anticipate NSVD   [redacted]w[redacted]d 12/16/2019, 2:21 PM

## 2019-12-17 LAB — BPAM RBC
Blood Product Expiration Date: 202105112359
Blood Product Expiration Date: 202105112359
Unit Type and Rh: 600
Unit Type and Rh: 600

## 2019-12-17 LAB — CBC
HCT: 28.4 % — ABNORMAL LOW (ref 36.0–46.0)
Hemoglobin: 9.4 g/dL — ABNORMAL LOW (ref 12.0–15.0)
MCH: 28.8 pg (ref 26.0–34.0)
MCHC: 33.1 g/dL (ref 30.0–36.0)
MCV: 87.1 fL (ref 80.0–100.0)
Platelets: 214 10*3/uL (ref 150–400)
RBC: 3.26 MIL/uL — ABNORMAL LOW (ref 3.87–5.11)
RDW: 14.7 % (ref 11.5–15.5)
WBC: 10.8 10*3/uL — ABNORMAL HIGH (ref 4.0–10.5)
nRBC: 0 % (ref 0.0–0.2)

## 2019-12-17 LAB — COMPREHENSIVE METABOLIC PANEL
ALT: 18 U/L (ref 0–44)
AST: 28 U/L (ref 15–41)
Albumin: 2.3 g/dL — ABNORMAL LOW (ref 3.5–5.0)
Alkaline Phosphatase: 104 U/L (ref 38–126)
Anion gap: 7 (ref 5–15)
BUN: 11 mg/dL (ref 6–20)
CO2: 24 mmol/L (ref 22–32)
Calcium: 8.5 mg/dL — ABNORMAL LOW (ref 8.9–10.3)
Chloride: 106 mmol/L (ref 98–111)
Creatinine, Ser: 0.78 mg/dL (ref 0.44–1.00)
GFR calc Af Amer: >60 mL/min (ref >60)
GFR calc non Af Amer: >60 mL/min (ref >60)
Glucose, Bld: 83 mg/dL (ref 70–99)
Potassium: 3.7 mmol/L (ref 3.5–5.1)
Sodium: 137 mmol/L (ref 135–145)
Total Bilirubin: 0.3 mg/dL (ref 0.3–1.2)
Total Protein: 5.2 g/dL — ABNORMAL LOW (ref 6.5–8.1)

## 2019-12-17 LAB — TYPE AND SCREEN
ABO/RH(D): AB NEG
Antibody Screen: NEGATIVE
Unit division: 0
Unit division: 0

## 2019-12-17 LAB — RPR: RPR Ser Ql: NONREACTIVE

## 2019-12-17 NOTE — Lactation Note (Signed)
This note was copied from a baby's chart. Lactation Consultation Note  Patient Name: Tiffany Leonard HQIXM'D Date: 12/17/2019 Reason for consult: Initial assessment P2, 12 hour term female infant. Infant had 4 voids and 2 stools since birth. Mom is experienced at breastfeeding she breastfed her 33 year old son for 8 months. Per mom, breastfeeding is going well and infant is increasing time at breast, last feeding was 45 minutes total infant breastfed on both breast. LC did not observe latch, infant finished feeding prior to [facility] entering the room. Mom was doing STS with infant when [facility] entered the room. Mom is breastfeeding according to cues and not going past 3 hours without feeding infant.  Mom knows to call RN or LC if she needs assistance with latch. Mom made aware of O/P services, breastfeeding support groups, community resources, and our phone # for post-discharge questions.    Maternal Data Formula Feeding for Exclusion: No Has patient been taught Hand Expression?: Yes  Feeding Feeding Type: Breast Fed  LATCH Score                   Interventions Interventions: Breast feeding basics reviewed;Skin to skin;Hand express  Lactation Tools Discussed/Used WIC Program: No   Consult Status Consult Status: Follow-up Date: 12/17/19 Follow-up type: In-patient    Danelle Earthly 12/17/2019, 7:12 AM

## 2019-12-17 NOTE — Lactation Note (Signed)
This note was copied from a baby's chart. Lactation Consultation Note  Patient Name: Tiffany Leonard HOZYY'Q Date: 12/17/2019  P2, 10 hour term female infant. LC entered the room, mom and infant asleep.   Maternal Data    Feeding Feeding Type: Breast Fed  LATCH Score                   Interventions    Lactation Tools Discussed/Used     Consult Status      Tiffany Leonard 12/17/2019, 4:38 AM

## 2019-12-17 NOTE — Discharge Summary (Signed)
OB Discharge Summary     Patient Name: Tiffany Leonard DOB: Jan 06, 1986 MRN: [id number]  Date of admission: 12/16/2019 Delivering MD: Linda Hedges   Date of discharge: 12/17/2019  Admitting diagnosis: Pregnancy [Z34.90] Intrauterine pregnancy: [redacted]w[redacted]d     Secondary diagnosis:  Active Problems:   Encounter for supervision of normal first pregnancy in third trimester   Pregnancy  Additional problems: none     Discharge diagnosis: Term Pregnancy Delivered                                                                                                Post partum procedures:none  Augmentation: AROM and Pitocin  Complications: None  Hospital course:  Induction of Labor With Vaginal Delivery   34 y.o. yo G3P2002 at [redacted]w[redacted]d was admitted to the hospital 12/16/2019 for induction of labor.  Indication for induction: Favorable cervix at term.  Patient had an uncomplicated labor course as follows: Membrane Rupture Time/Date: 6:21 PM ,12/16/2019   Intrapartum Procedures: Episiotomy: None [1]                                         Lacerations:  2nd degree [3];Perineal [11]  Patient had delivery of a Viable infant.  Information for the patient's newborn:  Tiffany, Leonard [[id number]]  Delivery Method: Vaginal, Spontaneous(Filed from Delivery Summary)    12/16/2019  Details of delivery can be found in separate delivery note.  Patient had a routine postpartum course. Patient is discharged home 12/17/19.  Physical exam  Vitals:   12/16/19 2149 12/17/19 0145 12/17/19 0550 12/17/19 0835  BP: (!) 139/92 (!) 148/89 122/86 (!) 118/59  Pulse: 80 83 72 82  Resp: 18 18 18 18   Temp: 98.7 F (37.1 C) 98.6 F (37 C) 98.3 F (36.8 C) 98.4 F (36.9 C)  TempSrc: Oral Oral Oral Oral  SpO2: 99% 99% 100%   Weight:      Height:       General: alert Lochia: appropriate Uterine Fundus: firm Incision: Healing well with no significant drainage DVT Evaluation: No evidence of DVT seen  on physical exam. Labs: Lab Results  Component Value Date   WBC 10.8 (H) 12/17/2019   HGB 9.4 (L) 12/17/2019   HCT 28.4 (L) 12/17/2019   MCV 87.1 12/17/2019   PLT 214 12/17/2019   CMP Latest Ref Rng & Units 12/17/2019  Glucose 70 - 99 mg/dL 83  BUN 6 - 20 mg/dL 11  Creatinine 0.44 - 1.00 mg/dL 0.78  Sodium 135 - 145 mmol/L 137  Potassium 3.5 - 5.1 mmol/L 3.7  Chloride 98 - 111 mmol/L 106  CO2 22 - 32 mmol/L 24  Calcium 8.9 - 10.3 mg/dL 8.5(L)  Total Protein 6.5 - 8.1 g/dL 5.2(L)  Total Bilirubin 0.3 - 1.2 mg/dL 0.3  Alkaline Phos 38 - 126 U/L 104  AST 15 - 41 U/L 28  ALT 0 - 44 U/L 18    Discharge instruction: per After Visit Summary and "Baby and Me Booklet".  After visit meds:  Allergies as of 12/17/2019      Reactions   Banana Itching   Pt reports itching in mouth and throat      Medication List    STOP taking these medications   butalbital-acetaminophen-caffeine 50-325-40 MG tablet Commonly known as: FIORICET     TAKE these medications   prenatal multivitamin Tabs tablet Take 1 tablet by mouth daily.   sertraline 100 MG tablet Commonly known as: ZOLOFT Take 100 mg by mouth daily.       Diet: routine diet  Activity: Advance as tolerated. Pelvic rest for 6 weeks.   Outpatient follow up:6 weeks Follow up Appt:No future appointments. Follow up Visit:No follow-ups on file.  Postpartum contraception: Not Discussed  Newborn Data: Live born female  Birth Weight: 7 lb 14.3 oz (3581 g) APGAR: 8, 9  Newborn Delivery   Birth date/time: 12/16/2019 18:38:00 Delivery type: Vaginal, Spontaneous      Baby Feeding: see baby shart Disposition:home with mother   12/17/2019 [name], MD

## 2019-12-17 NOTE — Progress Notes (Signed)
MOB was referred for history of depression/anxiety. * Referral screened out by Clinical Social Worker because none of the following criteria appear to apply: ~ History of anxiety/depression during this pregnancy, or of post-partum depression following prior delivery. ~ Diagnosis of anxiety and/or depression within last 3 years OR * MOB's symptoms currently being treated with medication and/or therapy. Per further chart review., MOB is on Zoloft for anxiety.    Please contact the Clinical Social Worker if needs arise, by MOB request, or if MOB scores greater than 9/yes to question 10 on [place] Postpartum Depression Screen.    Naryiah Schley S. Seema Blum, MSW, LCSW [facility] [phone]    

## 2019-12-17 NOTE — Lactation Note (Signed)
This note was copied from a baby's chart. Lactation Consultation Note  Patient Name: Tiffany Leonard XMIWO'E Date: 12/17/2019 Reason for consult: Follow-up assessment;Term;Infant weight loss;Other (Comment)(per dad had a large wet yesterday at 72 after birth)  Potential early D/C this evening per RN and per mom.  Baby is 20 hours old / post circ .  LC updated the last feeding per mom.  Per mom the baby slept after the circ for several hours and recently woke up and breast fed 20 mins. Mom trying to latch again and baby rooting on the nipple.  LC offered to check the baby's diaper and it was dry.  Dad mentioned the baby had a large wet diaper at delivery and once since then.  Baby latched for 12 mins with depth and assistance with swallows.  After feeding mom tired to re - latch and baby didn't seem interested.  Baby STS with mom.  Mom denies soreness. Sore nipple and engorgement prevention and tx reviewed.  Per mom has a HAKKA and a DEBP.  LC discussed nutritive vs non- nutritive feeding pattern and the importance of watching the baby for hanging out latched.  LC stressed the importance of STS feedings until the baby is back to birth weight, gaining steadily and can stay awake for majority of feeding.  LC reviewed feeding goals for 24 hours - 8-12 times in 24 hours with feeding cues.  Mom has the [facility] pamphlet with [facility] phone numbers.     Maternal Data Has patient been taught Hand Expression?: Yes  Feeding Feeding Type: Breast Fed  LATCH Score Latch: Grasps breast easily, tongue down, lips flanged, rhythmical sucking.  Audible Swallowing: Spontaneous and intermittent  Type of Nipple: Everted at rest and after stimulation  Comfort (Breast/Nipple): Soft / non-tender  Hold (Positioning): Assistance needed to correctly position infant at breast and maintain latch.  LATCH Score: 9  Interventions Interventions: Breast feeding basics reviewed;Assisted with latch;Skin to  skin;Breast compression;Adjust position;Support pillows  Lactation Tools Discussed/Used Pump Review: Milk Storage Initiated by:: MAI Date initiated:: 12/17/19   Consult Status Consult Status: Complete Date: 12/17/19 Follow-up type: In-patient    Matilde Sprang Modest Draeger 12/17/2019, 3:15 PM

## 2020-02-03 DIAGNOSIS — Z30430 Encounter for insertion of intrauterine contraceptive device: Secondary | ICD-10-CM | POA: Diagnosis not present

## 2020-02-03 DIAGNOSIS — Z3202 Encounter for pregnancy test, result negative: Secondary | ICD-10-CM | POA: Diagnosis not present

## 2020-07-17 DIAGNOSIS — M25551 Pain in right hip: Secondary | ICD-10-CM | POA: Diagnosis not present

## 2020-07-17 DIAGNOSIS — M79661 Pain in right lower leg: Secondary | ICD-10-CM | POA: Diagnosis not present

## 2020-08-23 DIAGNOSIS — M25551 Pain in right hip: Secondary | ICD-10-CM | POA: Diagnosis not present

## 2020-08-25 DIAGNOSIS — M25551 Pain in right hip: Secondary | ICD-10-CM | POA: Diagnosis not present

## 2020-08-30 DIAGNOSIS — M25551 Pain in right hip: Secondary | ICD-10-CM | POA: Diagnosis not present

## 2020-08-31 ENCOUNTER — Other Ambulatory Visit: Payer: Self-pay | Admitting: Physician Assistant

## 2020-08-31 ENCOUNTER — Other Ambulatory Visit: Payer: Self-pay | Admitting: Orthopedic Surgery

## 2020-08-31 DIAGNOSIS — M25551 Pain in right hip: Secondary | ICD-10-CM

## 2020-09-01 ENCOUNTER — Other Ambulatory Visit: Payer: Self-pay

## 2020-09-01 ENCOUNTER — Ambulatory Visit
Admission: RE | Admit: 2020-09-01 | Discharge: 2020-09-01 | Disposition: A | Discharge status: Home / Self Care | Payer: BC Managed Care – PPO | Source: Ambulatory Visit | Attending: Physician Assistant | Admitting: Physician Assistant

## 2020-09-01 DIAGNOSIS — M25551 Pain in right hip: Secondary | ICD-10-CM | POA: Diagnosis not present

## 2020-09-01 DIAGNOSIS — M25451 Effusion, right hip: Secondary | ICD-10-CM | POA: Diagnosis not present

## 2020-09-06 DIAGNOSIS — M25551 Pain in right hip: Secondary | ICD-10-CM | POA: Diagnosis not present

## 2020-09-06 DIAGNOSIS — M01X0 Direct infection of unspecified joint in infectious and parasitic diseases classified elsewhere: Secondary | ICD-10-CM | POA: Diagnosis not present

## 2020-09-06 LAB — CELL COUNT AND DIFF, FLUID, OTHER
Basophils, %: 0 %
Eosinophils, %: 0 %
Lymphocytes, %: 4 %
Mesothelial, %: 0 %
Monocyte/Macrophage %: 8 %
Neutrophils, %: 88 %
Total Nucleated Cell Ct: 3047 cells/uL

## 2020-09-06 LAB — ANAEROBIC AND AEROBIC CULTURE
MICRO NUMBER:: 11419837
MICRO NUMBER:: 11419838
SPECIMEN QUALITY:: ADEQUATE
SPECIMEN QUALITY:: ADEQUATE

## 2020-09-06 LAB — CRYSTALS, FLUID

## 2020-09-08 ENCOUNTER — Other Ambulatory Visit: Payer: Self-pay

## 2020-09-08 ENCOUNTER — Encounter (HOSPITAL_COMMUNITY): Payer: Self-pay | Admitting: Orthopedic Surgery
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
## 2020-09-08 NOTE — Progress Notes (Addendum)
Gram stain & culture from right hip aspiration done in office on 08/31/2020  CULTURE, ANAEROBIC BACTERIA W/GRAM STAIN       Micro Number:     [id number]   Test Status:        Final   Specimen Source:    R hip   Specimen Quality:   Adequate   Gram Stain:         Few White blood cells seen                        No epithelial cells seen                       No organisms seen    Result:             No anaerobes isolated.     CULTURE, AEROBIC BACTERIA       Micro Number:       [id number]   Test Status:       Final   Specimen Source:   R hip   Specimen Quality:   Adequate   Result:             Light growth of Staphylococcus aureus                        This isolate demonstrates inducible clindamycin resistance.                              S.aureus                             ----------------                             INT   MIC    CIPROFLOXACIN           S     <=0.5    CLINDAMYCIN             R     NR    ERYTHROMYCIN            R     4    GENTAMICIN              S     <=0.5    LEVOFLOXACIN            S     <=0.12    OXACILLIN                S     0.5 **1    TETRACYCLINE            S     <=1    TRIMETHOPRIM/SULFA     S     <=10    VANCOMYCIN              S     1  S=Susceptible  I=Intermediate  R=Resistant  * = Not Tested NR = Not Reported  **NN = See Therapy Comments   THERAPY COMMENTS  Note 1: Oxacillin-susceptible staphylococci are susceptible to other penicillinase-stable penicillins (e.g. Methicillin, Nafcillin), beta-lactam/beta-lactamase inhibitor combinations, and cephems with staphylococcal indications, including Cefazolin.

## 2020-09-08 NOTE — Progress Notes (Signed)
Pt aware to arrive at [facility] admitting at 10 am on Monday 09/11/2020 instead of 10:30am

## 2020-09-08 NOTE — Progress Notes (Addendum)
COVID Vaccine Completed:  No Date COVID Vaccine completed: COVID vaccine manufacturer: Pfizer    Moderna   [employer]   PCP - No PCP Cardiologist -   Chest x-ray - N/A EKG - N/A Stress Test -  ECHO -  Cardiac Cath -  Pacemaker/ICD device last checked:  Sleep Study - N/A CPAP -   Fasting Blood Sugar - N/A Checks Blood Sugar _____ times a day  Blood Thinner Instructions: Aspirin Instructions: Last Dose:  Anesthesia review:   Patient denies shortness of breath, fever, cough and chest pain at PAT appointment.  Pt is currently bedridden due to hip pain.  Unable to do housework due to hip pain.  Prior to hip pain was able to climb stairs and perform all ADLs independently.   Patient verbalized understanding of instructions that were given to them at the PAT appointment. Patient was also instructed that they will need to review over the PAT instructions again at home before surgery.

## 2020-09-09 ENCOUNTER — Other Ambulatory Visit (HOSPITAL_COMMUNITY)
Admission: RE | Admit: 2020-09-09 | Discharge: 2020-09-09 | Disposition: A | Discharge status: Home / Self Care | Payer: BC Managed Care – PPO | Source: Ambulatory Visit | Attending: Orthopedic Surgery | Admitting: Orthopedic Surgery

## 2020-09-09 DIAGNOSIS — F419 Anxiety disorder, unspecified: Secondary | ICD-10-CM | POA: Diagnosis not present

## 2020-09-09 DIAGNOSIS — M00051 Staphylococcal arthritis, right hip: Secondary | ICD-10-CM | POA: Diagnosis not present

## 2020-09-09 DIAGNOSIS — Z79899 Other long term (current) drug therapy: Secondary | ICD-10-CM | POA: Diagnosis not present

## 2020-09-09 DIAGNOSIS — Z91018 Allergy to other foods: Secondary | ICD-10-CM | POA: Diagnosis not present

## 2020-09-09 DIAGNOSIS — B9561 Methicillin susceptible Staphylococcus aureus infection as the cause of diseases classified elsewhere: Secondary | ICD-10-CM | POA: Diagnosis not present

## 2020-09-09 DIAGNOSIS — Z975 Presence of (intrauterine) contraceptive device: Secondary | ICD-10-CM | POA: Diagnosis not present

## 2020-09-09 DIAGNOSIS — U071 COVID-19: Secondary | ICD-10-CM | POA: Diagnosis not present

## 2020-09-09 DIAGNOSIS — M009 Pyogenic arthritis, unspecified: Secondary | ICD-10-CM | POA: Diagnosis not present

## 2020-09-09 DIAGNOSIS — Z01812 Encounter for preprocedural laboratory examination: Secondary | ICD-10-CM | POA: Insufficient documentation

## 2020-09-09 DIAGNOSIS — A327 Listerial sepsis: Secondary | ICD-10-CM | POA: Diagnosis not present

## 2020-09-09 LAB — SARS CORONAVIRUS 2 (TAT 6-24 HRS): SARS Coronavirus 2: POSITIVE — AB

## 2020-09-10 NOTE — Progress Notes (Addendum)
Left message with answering service at [facility] with patient's covid test results.   Update: Dr. Lequita Halt returned call and is aware of results.  Stated that he will call and let patient know.

## 2020-09-11 ENCOUNTER — Inpatient Hospital Stay (HOSPITAL_COMMUNITY): Payer: BC Managed Care – PPO | Admitting: Anesthesiology

## 2020-09-11 ENCOUNTER — Other Ambulatory Visit: Payer: Self-pay

## 2020-09-11 ENCOUNTER — Encounter (HOSPITAL_COMMUNITY)
Admission: RE | Disposition: A | Discharge status: Home / Self Care | Payer: Self-pay | Source: Home / Self Care | Attending: Orthopedic Surgery

## 2020-09-11 ENCOUNTER — Encounter (HOSPITAL_COMMUNITY): Payer: Self-pay | Admitting: Orthopedic Surgery

## 2020-09-11 ENCOUNTER — Inpatient Hospital Stay (HOSPITAL_COMMUNITY)
Admission: RE | Admit: 2020-09-11 | Discharge: 2020-09-13 | DRG: 480 | Disposition: A | Discharge status: Home Health | Payer: BC Managed Care – PPO | Attending: Orthopedic Surgery | Admitting: Orthopedic Surgery

## 2020-09-11 DIAGNOSIS — Z79899 Other long term (current) drug therapy: Secondary | ICD-10-CM | POA: Diagnosis not present

## 2020-09-11 DIAGNOSIS — Z91018 Allergy to other foods: Secondary | ICD-10-CM

## 2020-09-11 DIAGNOSIS — M009 Pyogenic arthritis, unspecified: Secondary | ICD-10-CM

## 2020-09-11 DIAGNOSIS — M00051 Staphylococcal arthritis, right hip: Secondary | ICD-10-CM | POA: Diagnosis present

## 2020-09-11 DIAGNOSIS — U071 COVID-19: Secondary | ICD-10-CM | POA: Diagnosis present

## 2020-09-11 DIAGNOSIS — B9561 Methicillin susceptible Staphylococcus aureus infection as the cause of diseases classified elsewhere: Secondary | ICD-10-CM | POA: Diagnosis present

## 2020-09-11 DIAGNOSIS — Z975 Presence of (intrauterine) contraceptive device: Secondary | ICD-10-CM | POA: Diagnosis not present

## 2020-09-11 DIAGNOSIS — F419 Anxiety disorder, unspecified: Secondary | ICD-10-CM | POA: Diagnosis present

## 2020-09-11 HISTORY — PX: INCISION AND DRAINAGE HIP: SHX1801

## 2020-09-11 HISTORY — DX: Pyogenic arthritis, unspecified: M00.9

## 2020-09-11 LAB — CBC
HCT: 32.6 % — ABNORMAL LOW (ref 36.0–46.0)
Hemoglobin: 10.3 g/dL — ABNORMAL LOW (ref 12.0–15.0)
MCH: 27.5 pg (ref 26.0–34.0)
MCHC: 31.6 g/dL (ref 30.0–36.0)
MCV: 87.2 fL (ref 80.0–100.0)
Platelets: 351 10*3/uL (ref 150–400)
RBC: 3.74 MIL/uL — ABNORMAL LOW (ref 3.87–5.11)
RDW: 14.5 % (ref 11.5–15.5)
WBC: 8.2 10*3/uL (ref 4.0–10.5)
nRBC: 0 % (ref 0.0–0.2)

## 2020-09-11 LAB — BASIC METABOLIC PANEL
Anion gap: 14 (ref 5–15)
BUN: 13 mg/dL (ref 6–20)
CO2: 24 mmol/L (ref 22–32)
Calcium: 9.5 mg/dL (ref 8.9–10.3)
Chloride: 100 mmol/L (ref 98–111)
Creatinine, Ser: 0.67 mg/dL (ref 0.44–1.00)
GFR, Estimated: >60 mL/min (ref >60)
Glucose, Bld: 97 mg/dL (ref 70–99)
Potassium: 3.7 mmol/L (ref 3.5–5.1)
Sodium: 138 mmol/L (ref 135–145)

## 2020-09-11 LAB — SURGICAL PCR SCREEN
MRSA, PCR: NEGATIVE
Staphylococcus aureus: NEGATIVE

## 2020-09-11 LAB — PREGNANCY, URINE: Preg Test, Ur: NEGATIVE

## 2020-09-11 SURGERY — IRRIGATION AND DEBRIDEMENT HIP
Anesthesia: General | Site: Hip | Laterality: Right

## 2020-09-11 MED ORDER — DEXAMETHASONE SODIUM PHOSPHATE 10 MG/ML IJ SOLN
10.0000 mg | Freq: Once | INTRAMUSCULAR | Status: AC
  Administered 2020-09-12: 10 mg via INTRAVENOUS
  Filled 2020-09-11: qty 1

## 2020-09-11 MED ORDER — LIDOCAINE HCL (CARDIAC) PF 100 MG/5ML IV SOSY
PREFILLED_SYRINGE | INTRAVENOUS | Status: DC | PRN
  Administered 2020-09-11: 60 mg via INTRAVENOUS

## 2020-09-11 MED ORDER — ACETAMINOPHEN 10 MG/ML IV SOLN
1000.0000 mg | Freq: Once | INTRAVENOUS | Status: AC
  Administered 2020-09-11: 1000 mg via INTRAVENOUS
  Filled 2020-09-11: qty 100

## 2020-09-11 MED ORDER — HYDROMORPHONE HCL 1 MG/ML IJ SOLN
0.2500 mg | INTRAMUSCULAR | Status: DC | PRN
  Administered 2020-09-11 (×2): 0.5 mg via INTRAVENOUS

## 2020-09-11 MED ORDER — ONDANSETRON HCL 4 MG/2ML IJ SOLN
INTRAMUSCULAR | Status: DC | PRN
  Administered 2020-09-11: 4 mg via INTRAVENOUS

## 2020-09-11 MED ORDER — FENTANYL CITRATE (PF) 250 MCG/5ML IJ SOLN
INTRAMUSCULAR | Status: AC
  Filled 2020-09-11: qty 5

## 2020-09-11 MED ORDER — OXYCODONE HCL 5 MG PO TABS: 5.0000 mg | ORAL_TABLET | Freq: Once | ORAL | Status: DC | PRN

## 2020-09-11 MED ORDER — ONDANSETRON HCL 4 MG PO TABS: 4.0000 mg | ORAL_TABLET | Freq: Four times a day (QID) | ORAL | Status: DC | PRN

## 2020-09-11 MED ORDER — BISACODYL 10 MG RE SUPP: 10.0000 mg | Freq: Every day | RECTAL | Status: DC | PRN

## 2020-09-11 MED ORDER — POVIDONE-IODINE 10 % EX SWAB
2.0000 "application " | Freq: Once | CUTANEOUS | Status: AC
  Administered 2020-09-11: 2 via TOPICAL

## 2020-09-11 MED ORDER — DOCUSATE SODIUM 100 MG PO CAPS
100.0000 mg | ORAL_CAPSULE | Freq: Two times a day (BID) | ORAL | Status: DC
  Administered 2020-09-11 – 2020-09-13 (×4): 100 mg via ORAL
  Filled 2020-09-11 (×4): qty 1

## 2020-09-11 MED ORDER — SUGAMMADEX SODIUM 200 MG/2ML IV SOLN
INTRAVENOUS | Status: DC | PRN
  Administered 2020-09-11: 200 mg via INTRAVENOUS

## 2020-09-11 MED ORDER — CHLORHEXIDINE GLUCONATE 4 % EX LIQD: 60.0000 mL | Freq: Once | CUTANEOUS | Status: DC

## 2020-09-11 MED ORDER — ORAL CARE MOUTH RINSE: 15.0000 mL | Freq: Once | OROMUCOSAL | Status: AC

## 2020-09-11 MED ORDER — LIDOCAINE HCL (PF) 2 % IJ SOLN
INTRAMUSCULAR | Status: AC
  Filled 2020-09-11: qty 5

## 2020-09-11 MED ORDER — PHENOL 1.4 % MT LIQD: 1.0000 | OROMUCOSAL | Status: DC | PRN

## 2020-09-11 MED ORDER — FENTANYL CITRATE (PF) 100 MCG/2ML IJ SOLN
25.0000 ug | INTRAMUSCULAR | Status: DC | PRN
Start: 2020-09-11 — End: 2020-09-11
  Administered 2020-09-11 (×3): 50 ug via INTRAVENOUS

## 2020-09-11 MED ORDER — MAGNESIUM CITRATE PO SOLN: 1.0000 | Freq: Once | ORAL | Status: DC | PRN

## 2020-09-11 MED ORDER — CHLORHEXIDINE GLUCONATE 0.12 % MT SOLN
15.0000 mL | Freq: Once | OROMUCOSAL | Status: AC
  Administered 2020-09-11: 15 mL via OROMUCOSAL

## 2020-09-11 MED ORDER — MENTHOL 3 MG MT LOZG: 1.0000 | LOZENGE | OROMUCOSAL | Status: DC | PRN

## 2020-09-11 MED ORDER — OXYCODONE HCL 5 MG/5ML PO SOLN
5.0000 mg | Freq: Once | ORAL | Status: DC | PRN
Start: 2020-09-11 — End: 2020-09-11

## 2020-09-11 MED ORDER — FENTANYL CITRATE (PF) 100 MCG/2ML IJ SOLN
INTRAMUSCULAR | Status: AC
  Filled 2020-09-11: qty 2

## 2020-09-11 MED ORDER — HYDROCODONE-ACETAMINOPHEN 10-325 MG PO TABS
1.0000 | ORAL_TABLET | ORAL | Status: DC | PRN
Start: 2020-09-11 — End: 2020-09-11

## 2020-09-11 MED ORDER — SERTRALINE HCL 100 MG PO TABS
100.0000 mg | ORAL_TABLET | Freq: Every day | ORAL | Status: DC
  Administered 2020-09-12 – 2020-09-13 (×2): 100 mg via ORAL
  Filled 2020-09-11 (×2): qty 1

## 2020-09-11 MED ORDER — HYDROMORPHONE HCL 1 MG/ML IJ SOLN
INTRAMUSCULAR | Status: AC
  Filled 2020-09-11: qty 1

## 2020-09-11 MED ORDER — ASPIRIN EC 325 MG PO TBEC
325.0000 mg | DELAYED_RELEASE_TABLET | Freq: Every day | ORAL | Status: DC
  Administered 2020-09-12 – 2020-09-13 (×2): 325 mg via ORAL
  Filled 2020-09-11 (×2): qty 1

## 2020-09-11 MED ORDER — ALPRAZOLAM 0.25 MG PO TABS
0.2500 mg | ORAL_TABLET | Freq: Every evening | ORAL | Status: DC | PRN
  Administered 2020-09-12: 0.25 mg via ORAL
  Filled 2020-09-11 (×2): qty 1

## 2020-09-11 MED ORDER — CEFAZOLIN SODIUM-DEXTROSE 2-4 GM/100ML-% IV SOLN
2.0000 g | Freq: Four times a day (QID) | INTRAVENOUS | Status: AC
  Administered 2020-09-11 – 2020-09-12 (×2): 2 g via INTRAVENOUS
  Filled 2020-09-11 (×2): qty 100

## 2020-09-11 MED ORDER — SODIUM CHLORIDE 0.9 % IR SOLN
Status: DC | PRN
  Administered 2020-09-11: 1000 mL

## 2020-09-11 MED ORDER — METHOCARBAMOL 500 MG PO TABS
500.0000 mg | ORAL_TABLET | Freq: Four times a day (QID) | ORAL | Status: DC | PRN
  Administered 2020-09-11 – 2020-09-12 (×3): 500 mg via ORAL
  Filled 2020-09-11 (×4): qty 1

## 2020-09-11 MED ORDER — FENTANYL CITRATE (PF) 100 MCG/2ML IJ SOLN
INTRAMUSCULAR | Status: DC | PRN
  Administered 2020-09-11 (×2): 50 ug via INTRAVENOUS
  Administered 2020-09-11: 100 ug via INTRAVENOUS
  Administered 2020-09-11: 25 ug via INTRAVENOUS
  Administered 2020-09-11 (×2): 100 ug via INTRAVENOUS
  Administered 2020-09-11: 25 ug via INTRAVENOUS
  Administered 2020-09-11: 50 ug via INTRAVENOUS

## 2020-09-11 MED ORDER — LACTATED RINGERS IV SOLN: INTRAVENOUS | Status: DC

## 2020-09-11 MED ORDER — ACETAMINOPHEN 325 MG PO TABS: 325.0000 mg | ORAL_TABLET | Freq: Four times a day (QID) | ORAL | Status: DC | PRN

## 2020-09-11 MED ORDER — CEFAZOLIN SODIUM-DEXTROSE 2-4 GM/100ML-% IV SOLN
2.0000 g | INTRAVENOUS | Status: AC
  Administered 2020-09-11: 2 g via INTRAVENOUS
  Filled 2020-09-11: qty 100

## 2020-09-11 MED ORDER — PROPOFOL 10 MG/ML IV BOLUS
INTRAVENOUS | Status: DC | PRN
Start: 2020-09-11 — End: 2020-09-11
  Administered 2020-09-11: 200 mg via INTRAVENOUS

## 2020-09-11 MED ORDER — ONDANSETRON HCL 4 MG/2ML IJ SOLN: 4.0000 mg | Freq: Four times a day (QID) | INTRAMUSCULAR | Status: DC | PRN

## 2020-09-11 MED ORDER — MIDAZOLAM HCL 5 MG/5ML IJ SOLN
INTRAMUSCULAR | Status: DC | PRN
  Administered 2020-09-11: 2 mg via INTRAVENOUS

## 2020-09-11 MED ORDER — SODIUM CHLORIDE 0.9 % IV SOLN: INTRAVENOUS | Status: DC

## 2020-09-11 MED ORDER — DEXAMETHASONE SODIUM PHOSPHATE 10 MG/ML IJ SOLN: 8.0000 mg | Freq: Once | INTRAMUSCULAR | Status: DC

## 2020-09-11 MED ORDER — POLYETHYLENE GLYCOL 3350 17 G PO PACK: 17.0000 g | PACK | Freq: Every day | ORAL | Status: DC | PRN

## 2020-09-11 MED ORDER — HYDROCODONE-ACETAMINOPHEN 10-325 MG PO TABS
1.0000 | ORAL_TABLET | ORAL | Status: DC | PRN
  Administered 2020-09-11 – 2020-09-12 (×3): 1 via ORAL
  Filled 2020-09-11 (×3): qty 1

## 2020-09-11 MED ORDER — METOCLOPRAMIDE HCL 5 MG PO TABS: 5.0000 mg | ORAL_TABLET | Freq: Three times a day (TID) | ORAL | Status: DC | PRN

## 2020-09-11 MED ORDER — SUCCINYLCHOLINE CHLORIDE 200 MG/10ML IV SOSY
PREFILLED_SYRINGE | INTRAVENOUS | Status: DC | PRN
  Administered 2020-09-11: 120 mg via INTRAVENOUS

## 2020-09-11 MED ORDER — MORPHINE SULFATE (PF) 2 MG/ML IV SOLN
0.5000 mg | INTRAVENOUS | Status: DC | PRN
  Administered 2020-09-11 – 2020-09-13 (×3): 1 mg via INTRAVENOUS
  Filled 2020-09-11 (×3): qty 1

## 2020-09-11 MED ORDER — METHOCARBAMOL 500 MG IVPB - SIMPLE MED
500.0000 mg | Freq: Four times a day (QID) | INTRAVENOUS | Status: DC | PRN
  Filled 2020-09-11: qty 50

## 2020-09-11 MED ORDER — ROCURONIUM BROMIDE 100 MG/10ML IV SOLN
INTRAVENOUS | Status: DC | PRN
  Administered 2020-09-11: 50 mg via INTRAVENOUS

## 2020-09-11 MED ORDER — MORPHINE SULFATE (PF) 4 MG/ML IV SOLN: 0.5000 mg | INTRAVENOUS | Status: DC | PRN

## 2020-09-11 MED ORDER — MIDAZOLAM HCL 2 MG/2ML IJ SOLN
INTRAMUSCULAR | Status: AC
  Filled 2020-09-11: qty 2

## 2020-09-11 MED ORDER — DEXAMETHASONE SODIUM PHOSPHATE 10 MG/ML IJ SOLN
INTRAMUSCULAR | Status: DC | PRN
  Administered 2020-09-11: 5 mg via INTRAVENOUS

## 2020-09-11 MED ORDER — DEXAMETHASONE SODIUM PHOSPHATE 10 MG/ML IJ SOLN
INTRAMUSCULAR | Status: AC
  Filled 2020-09-11: qty 1

## 2020-09-11 MED ORDER — ONDANSETRON HCL 4 MG/2ML IJ SOLN
INTRAMUSCULAR | Status: AC
  Filled 2020-09-11: qty 2

## 2020-09-11 MED ORDER — SODIUM CHLORIDE 0.9 % IR SOLN
Status: DC | PRN
  Administered 2020-09-11: 3000 mL

## 2020-09-11 MED ORDER — PROPOFOL 10 MG/ML IV BOLUS
INTRAVENOUS | Status: AC
  Filled 2020-09-11: qty 20

## 2020-09-11 MED ORDER — METOCLOPRAMIDE HCL 5 MG/ML IJ SOLN: 5.0000 mg | Freq: Three times a day (TID) | INTRAMUSCULAR | Status: DC | PRN

## 2020-09-11 SURGICAL SUPPLY — 41 items
BAG ZIPLOCK 12X15 (MISCELLANEOUS) ×2 IMPLANT
COVER SURGICAL LIGHT HANDLE (MISCELLANEOUS) ×2 IMPLANT
COVER WAND RF STERILE (DRAPES) IMPLANT
DRAPE INCISE IOBAN 66X45 STRL (DRAPES) ×2 IMPLANT
DRAPE ORTHO SPLIT 77X108 STRL (DRAPES) ×2
DRAPE SURG ORHT 6 SPLT 77X108 (DRAPES) ×2 IMPLANT
DRAPE U-SHAPE 47X51 STRL (DRAPES) ×2 IMPLANT
DRESSING AQUACEL AG SP 3.5X10 (GAUZE/BANDAGES/DRESSINGS) ×1 IMPLANT
DRSG AQUACEL AG SP 3.5X10 (GAUZE/BANDAGES/DRESSINGS) ×2
DRSG MEPILEX BORDER 4X4 (GAUZE/BANDAGES/DRESSINGS) ×2 IMPLANT
DURAPREP 26ML APPLICATOR (WOUND CARE) ×2 IMPLANT
ELECT REM PT RETURN 15FT ADLT (MISCELLANEOUS) ×2 IMPLANT
EVACUATOR 1/8 PVC DRAIN (DRAIN) ×2 IMPLANT
GLOVE BIO SURGEON STRL SZ8 (GLOVE) ×2 IMPLANT
GLOVE SRG 8 PF TXTR STRL LF DI (GLOVE) ×1 IMPLANT
GLOVE SURG ENC MOIS LTX SZ6 (GLOVE) ×2 IMPLANT
GLOVE SURG ENC MOIS LTX SZ7 (GLOVE) ×2 IMPLANT
GLOVE SURG UNDER POLY LF SZ6.5 (GLOVE) ×2 IMPLANT
GLOVE SURG UNDER POLY LF SZ7 (GLOVE) ×2 IMPLANT
GLOVE SURG UNDER POLY LF SZ8 (GLOVE) ×1
GOWN STRL REUS W/TWL LRG LVL3 (GOWN DISPOSABLE) ×4 IMPLANT
HANDPIECE INTERPULSE COAX TIP (DISPOSABLE) ×1
KIT BASIN OR (CUSTOM PROCEDURE TRAY) ×2 IMPLANT
KIT TURNOVER KIT A (KITS) ×2 IMPLANT
MANIFOLD NEPTUNE II (INSTRUMENTS) ×2 IMPLANT
PACK TOTAL JOINT (CUSTOM PROCEDURE TRAY) ×2 IMPLANT
PENCIL SMOKE EVACUATOR (MISCELLANEOUS) ×2 IMPLANT
PROTECTOR NERVE ULNAR (MISCELLANEOUS) ×2 IMPLANT
SET HNDPC FAN SPRY TIP SCT (DISPOSABLE) ×1 IMPLANT
SPONGE GAUZE 2X2 8PLY STRL LF (GAUZE/BANDAGES/DRESSINGS) ×2 IMPLANT
STAPLER VISISTAT 35W (STAPLE) IMPLANT
STRIP CLOSURE SKIN 1/2X4 (GAUZE/BANDAGES/DRESSINGS) ×2 IMPLANT
SUT MNCRL AB 4-0 PS2 18 (SUTURE) ×2 IMPLANT
SUT STRATAFIX PDS+ 0 24IN (SUTURE) ×2 IMPLANT
SUT VIC AB 1 CT1 27 (SUTURE) ×3
SUT VIC AB 1 CT1 27XBRD ANTBC (SUTURE) ×3 IMPLANT
SUT VIC AB 2-0 CT1 27 (SUTURE) ×3
SUT VIC AB 2-0 CT1 TAPERPNT 27 (SUTURE) ×3 IMPLANT
SWAB COLLECTION DEVICE MRSA (MISCELLANEOUS) ×2 IMPLANT
SWAB CULTURE ESWAB REG 1ML (MISCELLANEOUS) ×2 IMPLANT
TOWEL OR 17X26 10 PK STRL BLUE (TOWEL DISPOSABLE) ×4 IMPLANT

## 2020-09-11 NOTE — Discharge Instructions (Addendum)
Tiffany Gross, MD [facility] [facility] [address], [address] [place], [place] [address] [phone]  POSTOPERATIVE DIRECTIONS  BLOOD CLOT PREVENTION   Take an 81 mg aspirin twice a day for three weeks following surgery.  HOME CARE INSTRUCTIONS   Remove items at home which could result in a fall. This includes throw rugs or furniture in walking pathways.   ICE to the affected hip as frequently as 20-30 minutes an hour and then as needed for pain and swelling. Continue to use ice on the hip for pain and swelling from surgery. You may notice swelling that will progress down to the foot and ankle. This is normal after surgery. Elevate the leg when you are not up walking on it.    Continue to use the breathing machine which will help keep your temperature down.  It is common for your temperature to cycle up and down following surgery, especially at night when you are not up moving around and exerting yourself.  The breathing machine keeps your lungs expanded and your temperature down.  DIET You may resume your previous home diet once your are discharged from the hospital.  DRESSING / WOUND CARE / SHOWERING  You have an adhesive waterproof bandage over the incision. Leave this in place until your first follow-up appointment. Once you remove this you will not need to place another bandage.   You may begin showering 3 days following surgery, but do not submerge the incision under water.  ACTIVITY  Walk with your walker as instructed. Use the walker until you are comfortable transitioning to a cane. Walk with the cane in the opposite hand of the operative leg. You may discontinue the cane once you are comfortable and walking steadily.  Avoid periods of inactivity such as sitting longer than an hour when not asleep. This helps prevent blood clots.   Do not drive a car for 6 weeks or until released by your surgeon.   Do not drive while taking  narcotics.  TED HOSE STOCKINGS Wear the elastic stockings on both legs for three weeks following surgery during the day. You may remove them at night while sleeping.  WEIGHT BEARING Weight bearing as tolerated with assist device (walker, cane, etc) as directed, use it as long as suggested by your surgeon or therapist, typically at least 4-6 weeks.  POSTOPERATIVE CONSTIPATION PROTOCOL Constipation - defined medically as fewer than three stools per week and severe constipation as less than one stool per week.  One of the most common issues patients have following surgery is constipation.  Even if you have a regular bowel pattern at home, your normal regimen is likely to be disrupted due to multiple reasons following surgery.  Combination of anesthesia, postoperative narcotics, change in appetite and fluid intake all can affect your bowels.  In order to avoid complications following surgery, here are some recommendations in order to help you during your recovery period.   Colace (docusate) - Pick up an over-the-counter form of Colace or another stool softener and take twice a day as long as you are requiring postoperative pain medications.  Take with a full glass of water daily.  If you experience loose stools or diarrhea, hold the colace until you stool forms back up.  If your symptoms do not get better within 1 week or if they get worse, check with your doctor.  Dulcolax (bisacodyl) - Pick up over-the-counter and take as directed by the product packaging as needed to assist  with the movement of your bowels.  Take with a full glass of water.  Use this product as needed if not relieved by Colace only.   MiraLax (polyethylene glycol) - Pick up over-the-counter to have on hand.  MiraLax is a solution that will increase the amount of water in your bowels to assist with bowel movements.  Take as directed and can mix with a glass of water, juice, soda, coffee, or tea.  Take if you go more than two days  without a movement.Do not use MiraLax more than once per day. Call your doctor if you are still constipated or irregular after using this medication for 7 days in a row.  If you continue to have problems with postoperative constipation, please contact the office for further assistance and recommendations.  If you experience "the worst abdominal pain ever" or develop nausea or vomiting, please contact the office immediatly for further recommendations for treatment.  ITCHING  If you experience itching with your medications, try taking only a single pain pill, or even half a pain pill at a time.  You can also use Benadryl over the counter for itching or also to help with sleep.   MEDICATIONS See your medication summary on the After Visit Summary that the nursing staff will review with you prior to discharge.  You may have some home medications which will be placed on hold until you complete the course of blood thinner medication.  It is important for you to complete the blood thinner medication as prescribed by your surgeon.  Continue your approved medications as instructed at time of discharge.  PRECAUTIONS If you experience chest pain or shortness of breath - call 911 immediately for transfer to the hospital emergency department.  If you develop a fever greater that 101 F, purulent drainage from wound, increased redness or drainage from wound, foul odor from the wound/dressing, or calf pain - CONTACT YOUR SURGEON.                                                   FOLLOW-UP APPOINTMENTS Make sure you keep all of your appointments after your operation with your surgeon and caregivers. You should call the office at the above phone number and make an appointment for approximately two weeks after the date of your surgery or on the date instructed by your surgeon outlined in the "After Visit Summary".  MAKE SURE YOU:   Understand these instructions.   Get help right away if you are not doing well or  get worse.    Pick up stool softner and laxative for home use following surgery while on pain medications. Do not submerge incision under water. Please use good hand washing techniques while changing dressing each day. May shower starting three days after surgery. Please use a clean towel to pat the incision dry following showers. Continue to use ice for pain and swelling after surgery. Do not use any lotions or creams on the incision until instructed by your surgeon.

## 2020-09-11 NOTE — Brief Op Note (Signed)
09/11/2020  3:06 PM  PATIENT:  Tiffany Leonard  35 y.o. female  PRE-OPERATIVE DIAGNOSIS: Septic arthritis right hip  POST-OPERATIVE DIAGNOSIS:  septic arthritis right hip  PROCEDURE:  Procedure(s) with comments: Right hip arthrotomy, irrigation and debridement-anterior approach (Right) -  SURGEON:  Surgeon(s) and Role:    Ollen Gross, MD - Primary  PHYSICIAN ASSISTANT:   ASSISTANTS: Arther Abbott, PA-C   ANESTHESIA:   general  EBL:  10 mL   BLOOD ADMINISTERED:none  DRAINS: (Medium) Hemovact drain(s) in the right hip with  Suction Open   LOCAL MEDICATIONS USED:  MARCAINE    COUNTS:  YES  TOURNIQUET:  * No tourniquets in log *  DICTATION: .Other Dictation: Dictation Number [id number]  PLAN OF CARE: Admit to inpatient   PATIENT DISPOSITION:  PACU - hemodynamically stable.

## 2020-09-11 NOTE — Plan of Care (Signed)
Plan of care reviewed and discussed with the patient. 

## 2020-09-11 NOTE — Transfer of Care (Signed)
Immediate Anesthesia Transfer of Care Note  Patient: Tiffany Leonard  Procedure(s) Performed: Right hip arthrotomy, irrigation and debridement-anterior approach (Right Hip)  Patient Location: PACU  Anesthesia Type:General  Level of Consciousness: awake, alert , oriented, drowsy and patient cooperative  Airway & Oxygen Therapy: Patient Spontanous Breathing and Patient connected to face mask oxygen  Post-op Assessment: Report given to RN and Post -op Vital signs reviewed and stable  Post vital signs: Reviewed and stable  Last Vitals:  Vitals Value Taken Time  BP 165/92 09/11/20 1422  Temp    Pulse 140 09/11/20 1427  Resp 22 09/11/20 1427  SpO2 100 % 09/11/20 1427  Vitals shown include unvalidated device data.  Last Pain:  Vitals:   09/11/20 1034  TempSrc:   PainSc: 0-No pain         Complications: No complications documented.

## 2020-09-11 NOTE — Anesthesia Preprocedure Evaluation (Signed)
Anesthesia Evaluation  Patient identified by MRN, date of birth, ID band Patient awake    Reviewed: Allergy & Precautions, H&P , NPO status , Patient's Chart, lab work & pertinent test results  Airway Mallampati: II   Neck ROM: full    Dental   Pulmonary  COVID+   breath sounds clear to auscultation       Cardiovascular negative cardio ROS   Rhythm:regular Rate:Normal     Neuro/Psych    GI/Hepatic   Endo/Other    Renal/GU      Musculoskeletal  (+) Arthritis , Septic arthritis   Abdominal   Peds  Hematology   Anesthesia Other Findings   Reproductive/Obstetrics                             Anesthesia Physical Anesthesia Plan  ASA: II  Anesthesia Plan: General   Post-op Pain Management:    Induction: Intravenous  PONV Risk Score and Plan: 3 and Ondansetron, Dexamethasone and Midazolam  Airway Management Planned: Oral ETT  Additional Equipment:   Intra-op Plan:   Post-operative Plan: Extubation in OR  Informed Consent: I have reviewed the patients History and Physical, chart, labs and discussed the procedure including the risks, benefits and alternatives for the proposed anesthesia with the patient or authorized representative who has indicated his/her understanding and acceptance.     Dental advisory given  Plan Discussed with: CRNA, Anesthesiologist and Surgeon  Anesthesia Plan Comments:         Anesthesia Quick Evaluation

## 2020-09-11 NOTE — Op Note (Signed)
NAME: Tiffany Leonard, Tiffany Leonard [facility] MEDICAL RECORD EH:[id number] ACCOUNT [id number] DATE OF BIRTH:27-Mar-1986 FACILITY: [facility] LOCATION: [facility] PHYSICIAN:Levonte Molina Dulcy Fanny, MD  OPERATIVE REPORT  DATE OF PROCEDURE:  09/11/2020  PREOPERATIVE DIAGNOSIS:  Septic arthritis, right hip.  POSTOPERATIVE DIAGNOSIS:  Septic arthritis, right hip.  PROCEDURE:  Right hip arthrotomy, irrigation and debridement.  SURGEON:  Ollen Gross, MD  ASSISTANT:  Arther Abbott, PA-C  ANESTHESIA:  General.  ESTIMATED BLOOD LOSS:  Minimal.  DRAINS:  Hemovac x1.  COMPLICATIONS:  None.  CONDITION:  Stable to recovery.  BRIEF CLINICAL NOTE:  The patient is a 35 year old female who presented to my office last week with severe right hip pain.  She had been seen in another orthopedic practice and had pretty severe hip pain and had an intra-articular hip injection  approximately a month ago.  She had increased pain and presented to our practice approximately a week before seeing me and had an MRI scan which showed fluid in the joint.  She had an aspiration which showed Staph aureus.  She had significant erosive  changes in the hip joint on her MRI and on her plain films.  She presents now for open arthrotomy, irrigation and debridement.  PROCEDURE IN DETAIL:  After successful administration of general anesthetic, the patient was placed supine on the Hana bed with both feet in well-padded boots and then placed on the traction table.  The right lower extremity is prepped and draped in the  usual sterile fashion.  The incision is then made for the anterior approach to the hip, incision starts about 1 cm lateral and 1 cm distal to the AIIS.  Incision was made with a 10 blade through subcutaneous tissue to the fascia overlying the tensor  fascia lata muscle.  That fascia was incised and then the muscle was separated from the fascia and retracted laterally.  The interval between tensor fascia lata and rectus femoris is  identified and the 3 vessels overlying the femoral neck were identified  and cauterized.  We then removed the adipose tissue overlying the capsule and placed retractors around the femoral neck.  Capsulotomy is performed and joint entered.  We saw a small amount of purulent fluid and that was sent for a specimen for Gram  stain, culture and sensitivity, aerobic and anaerobic cultures.  The joint is then irrigated with 3 liters of saline using pulsatile lavage.  She had a fair amount of synovitis and the synovium had been debrided prior to the irrigation.  The joint was  completely decompressed.  We rotated the leg on the Hana table and there was no evidence of any other fluid that was emanating from the joint with the movement of the hip.  Further irrigation was performed.  I removed part of the capsule but closed the  remainder of it.  This was closed with #1 Vicryl suture.  I placed the Hemovac drain that exited distal to the incision.  The fascia is then closed with a running StrataFix suture.  Subcu was closed with interrupted 2-0 Vicryl and subcuticular running  4-0 Monocryl.  Drain was hooked to suction.  Incision cleaned and dried and a sterile dressing applied.  She was then awakened and transported to recovery in stable condition.  HN/NUANCE  D:09/11/2020 T:09/11/2020 JOB:[id number]

## 2020-09-11 NOTE — Interval H&P Note (Signed)
History and Physical Interval Note:  09/11/2020 10:45 AM  Tiffany Leonard  has presented today for surgery, with the diagnosis of septic arthritis right hip.  The various methods of treatment have been discussed with the patient and family. After consideration of risks, benefits and other options for treatment, the patient has consented to  Procedure(s) with comments: Right hip arthrotomy, irrigation and debridement-anterior approach (Right) - as a surgical intervention.  The patient's history has been reviewed, patient examined, no change in status, stable for surgery.  I have reviewed the patient's chart and labs.  Questions were answered to the patient's satisfaction.     Homero Fellers Ramiz Turpin

## 2020-09-11 NOTE — Anesthesia Procedure Notes (Signed)
Procedure Name: Intubation Date/Time: 09/11/2020 12:52 PM Performed by: Raenette Rover, CRNA Pre-anesthesia Checklist: Patient identified, Emergency Drugs available, Patient being monitored and Suction available Patient Re-evaluated:Patient Re-evaluated prior to induction Oxygen Delivery Method: Circle system utilized Preoxygenation: Pre-oxygenation with 100% oxygen Induction Type: IV induction and Rapid sequence Laryngoscope Size: Mac and 3 Grade View: Grade I Tube type: Oral Tube size: 7.0 mm Number of attempts: 1 Airway Equipment and Method: Stylet Placement Confirmation: ETT inserted through vocal cords under direct vision,  positive ETCO2 and breath sounds checked- equal and bilateral Secured at: 21 cm Tube secured with: Tape Dental Injury: Teeth and Oropharynx as per pre-operative assessment

## 2020-09-11 NOTE — Consult Note (Incomplete Revision)
[facility]                                                                                        Patient Identification: Patient Name: Tiffany Leonard MRN: [id number] [place] Date: 09/11/2020 10:04 AM Today's Date: 09/11/2020 Reason for consult: Septic arthritis   Principal Problem:   Septic arthritis of hip ([place])   Antibiotics: None   Lines/Tubes: PIVs   Assessment # Rt Hip Septic Arthritis ( native joint) - h/o prior intraarticular injection  - MRI with septic arthritis with a moderate effusion, a suspected abscess in the superior acetabulum, and periarticular soft tissue edema and inflammation. I don't see these results in epic.  - s/p Rt hip aspiration in 1/14 with MSSA. SF analysis WBC 3,047 cells/microL, N -88%  # COVID positive - asymptomatic   Recommendations  - Continue cefazolin - Will need a PICC line for IV abx  - Will follow OR cultures and further surgical plans - Monitor CBC, BMP on IV abx - Baseline CRP and ESR  - A follow up appointment with [facility] has been made - COVID isolation precautions per Infection Prevention protocol  - OPAT orders in   Rest of the management as per the primary team. Please call with questions or concerns.  Thank you for the consult __________________________________________________________________________________________________________ HPI and Hospital Course: Tiffany Leonard is a 35 year old female with no significant PMH who was directly admitted by Ortho on 1/21 for I and D of rt septic hip joint. Patient says she started having rt hip pain since October and was seen by [facility]  and was given a cortisone shot, 2 days after she needed to be seen in the ED due to severe pain. She later started following up with Dr Maureen Ralphs on 08/25/2020. MRI rt hip showed septic arthritis with a moderate effusion, a suspected  abscess in the superior acetabulum, and periarticular soft tissue edema and inflammation per Ortho notes. A joint aspirate was performed  on 08/31/2020 which was positive for MSSA and hence was admitted for I and D.  Patient denies taking any abx prior to admit or any other prior surgeries. She feels her pain has been better controlled after going to OR. Denies any fevers, chills, sweats, N/V/D or GU symptoms  ROS: 11 point ROS done with pertinent positives and negatives listed above   Past Medical History:  Diagnosis Date  . Anxiety    Past Surgical History:  Procedure Laterality Date  . broken pelvis  2014  . SHOULDER SURGERY  2014    Scheduled Meds: . chlorhexidine  60 mL Topical Once  . dexamethasone (DECADRON) injection  8 mg Intravenous Once   Continuous Infusions: . acetaminophen    .  ceFAZolin (ANCEF) IV    . lactated ringers    . lactated ringers 10 mL/hr at 09/11/20 1151   PRN Meds:.fentaNYL (SUBLIMAZE) injection, ondansetron (ZOFRAN) IV, oxyCODONE **OR** oxyCODONE  Allergies  Allergen Reactions  . Banana Itching    Pt reports itching in mouth and throat   Social History   Socioeconomic History  . Marital status: Married  Spouse name: Not on file  . Number of children: Not on file  . Years of education: Not on file  . Highest education level: Not on file  Occupational History  . Not on file  Tobacco Use  . Smoking status: Never Smoker  . Smokeless tobacco: Never Used  Vaping Use  . Vaping Use: Never used  Substance and Sexual Activity  . Alcohol use: Not Currently  . Drug use: Never  . Sexual activity: Yes    Birth control/protection: I.U.D.  Other Topics Concern  . Not on file  Social History Narrative  . Not on file   Social Determinants of Health   Financial Resource Strain: Not on file  Food Insecurity: Not on file  Transportation Needs: Not on file  Physical Activity: Not on file  Stress: Not on file  Social Connections: Not on file   Intimate Partner Violence: Not on file    Vitals BP (!) 166/96   Pulse (!) 110   Temp 98.4 F (36.9 C) (Oral)   Resp 19   Ht '5\' 10"'  (1.778 m)   Wt 80.4 kg   SpO2 98%   BMI 25.43 kg/m    Physical Exam Constitutional:  Not in acute distress, eating lunch in a couch     Comments:   Cardiovascular:     Rate and Rhythm: Normal rate and regular rhythm.     Heart sounds: No murmur heard.   Pulmonary:     Effort: Pulmonary effort is normal.     Comments: clear air entry bilaterally   Abdominal:     Palpations: Abdomen is soft. BS+    Tenderness: Non tender   Musculoskeletal:        General: Rt hip/thigh with a bandage, haemovac+, minimal erythema and tenderness +  Skin:    Comments: no obvious lesions or rashes  Neurological:     General: No focal deficit present.   Psychiatric:   WNL  Pertinent Microbiology Gram stain & culture from right hip aspiration done in office on 08/31/2020  CULTURE, ANAEROBIC BACTERIA W/GRAM STAIN       Micro Number:          [id number]   Test Status:               Final   Specimen Source:    R hip   Specimen Quality:     Adequate   Gram Stain:               Few White blood cells seen                                     No epithelial cells seen                                     No organisms seen    Result:                       No anaerobes isolated.     CULTURE, AEROBIC BACTERIA       Micro Number:          [id number]   Test Status:               Final   Specimen Source:    R hip   Specimen  Quality:     Adequate   Result:                       Light growth of Staphylococcus aureus                                     This isolate demonstrates inducible clindamycin resistance.                                                  S.aureus                                                 ----------------                                                 INT   MIC    CIPROFLOXACIN               S     <=0.5    CLINDAMYCIN                     R     NR    ERYTHROMYCIN               R     4    GENTAMICIN                      S     <=0.5    LEVOFLOXACIN                 S     <=0.12    OXACILLIN                                      S     0.5 **1    TETRACYCLINE                 S     <=1    TRIMETHOPRIM/SULFA     S     <=10    VANCOMYCIN                    S     1  S=Susceptible  I=Intermediate  R=Resistant  * = Not Tested NR = Not Reported  **NN = See Therapy Comments   THERAPY COMMENTS  Note 1: Oxacillin-susceptible staphylococci are susceptible to other penicillinase-stable penicillins (e.g. Methicillin, Nafcillin), beta-lactam/beta-lactamase inhibitor combinations, and cephems with staphylococcal indications, including Cefazolin.    Results for orders placed or performed during the hospital encounter of 09/09/20  SARS CORONAVIRUS 2 (TAT 6-24 HRS) Nasopharyngeal Nasopharyngeal Swab     Status: Abnormal   Collection Time: 09/09/20 12:13 PM   Specimen: Nasopharyngeal Swab  Result Value Ref Range Status   SARS Coronavirus 2 POSITIVE (A) NEGATIVE Final    Comment: (NOTE) SARS-CoV-2 target nucleic acids are DETECTED.  The SARS-CoV-2 RNA is generally detectable in upper and lower respiratory specimens during the acute phase of infection. Positive results are indicative of the presence of SARS-CoV-2 RNA. Clinical correlation with patient history and other diagnostic information is  necessary to determine patient infection status. Positive results do not rule out bacterial infection or co-infection with other viruses.  The expected result is Negative.  Fact Sheet for Patients: SugarRoll.be  Fact Sheet for Healthcare Providers: https://www.woods-mathews.com/  This test is not yet approved or cleared by the [place] FDA and  has been authorized for detection and/or diagnosis of SARS-CoV-2 by FDA under an Emergency Use Authorization (EUA). This EUA will remain  in  effect (meaning this test can be used) for the duration of the COVID-19 declaration under Section 564(b)(1) of the Act, 21 U. S.C. section 360bbb-3(b)(1), unless the authorization is terminated or revoked sooner.   Performed at [facility], [place] [address], [place], [place] [address]     Pertinent Lab seen by me: CBC Latest Ref Rng & Units 09/11/2020 12/17/2019 12/16/2019  WBC 4.0 - 10.5 K/uL 8.2 10.8(H) 8.4  Hemoglobin 12.0 - 15.0 g/dL 10.3(L) 9.4(L) 11.4(L)  Hematocrit 36.0 - 46.0 % 32.6(L) 28.4(L) 34.9(L)  Platelets 150 - 400 K/uL 351 214 238   CMP Latest Ref Rng & Units 09/11/2020 12/17/2019  Glucose 70 - 99 mg/dL 97 83  BUN 6 - 20 mg/dL 13 11  Creatinine 0.44 - 1.00 mg/dL 0.67 0.78  Sodium 135 - 145 mmol/L 138 137  Potassium 3.5 - 5.1 mmol/L 3.7 3.7  Chloride 98 - 111 mmol/L 100 106  CO2 22 - 32 mmol/L 24 24  Calcium 8.9 - 10.3 mg/dL 9.5 8.5(L)  Total Protein 6.5 - 8.1 g/dL - 5.2(L)  Total Bilirubin 0.3 - 1.2 mg/dL - 0.3  Alkaline Phos 38 - 126 U/L - 104  AST 15 - 41 U/L - 28  ALT 0 - 44 U/L - 18     Pertinent Imagings/Other Imagings Plain films and CT images have been personally visualized and interpreted; radiology reports have been reviewed. Decision making incorporated into the Impression / Recommendations.  I have spent greater than 60 minutes for this patient encounter including review of prior medical records with greater than 50% of time being face to face and coordination of their care.  Electronically signed by:   Rosiland Oz, MD Infectious Disease Physician [facility] Pager: [phone]

## 2020-09-11 NOTE — Consult Note (Addendum)
[facility]                                                                                        Patient Identification: Patient Name: Tiffany Leonard MRN: [id number] [place] Date: 09/11/2020 10:04 AM Today's Date: 09/11/2020 Reason for consult: Septic arthritis   Principal Problem:   Septic arthritis of hip ([place])   Antibiotics: None   Lines/Tubes: PIVs   Assessment # Rt Hip Septic Arthritis ( native joint) - h/o prior intraarticular injection  - MRI with septic arthritis with a moderate effusion, a suspected abscess in the superior acetabulum, and periarticular soft tissue edema and inflammation. I don't see these results in epic.  - s/p Rt hip aspiration in 1/14 with MSSA. SF analysis WBC 3,047 cells/microL, N -88%  # COVID positive - asymptomatic   Recommendations  - Continue cefazolin - Will need a PICC line for IV abx  - Will follow OR cultures and further surgical plans - Monitor CBC, BMP on IV abx - Baseline CRP and ESR  - A follow up appointment with [facility] has been made - COVID isolation precautions per Infection Prevention protocol  - OPAT orders in   Rest of the management as per the primary team. Please call with questions or concerns.  Thank you for the consult __________________________________________________________________________________________________________ HPI and Hospital Course: Tiffany Leonard is a 35 year old female with no significant PMH who was directly admitted by Ortho on 1/21 for I and D of rt septic hip joint. Patient says she started having rt hip pain since October and was seen by [facility]  and was given a cortisone shot, 2 days after she needed to be seen in the ED due to severe pain. She later started following up with Dr Maureen Ralphs on 08/25/2020. MRI rt hip showed septic arthritis with a moderate effusion, a suspected  abscess in the superior acetabulum, and periarticular soft tissue edema and inflammation per Ortho notes. A joint aspirate was performed  on 08/31/2020 which was positive for MSSA and hence was admitted for I and D.  Patient denies taking any abx prior to admit or any other prior surgeries. She feels her pain has been better controlled after going to OR. Denies any fevers, chills, sweats, N/V/D or GU symptoms  ROS: 11 point ROS done with pertinent positives and negatives listed above   Past Medical History:  Diagnosis Date  . Anxiety    Past Surgical History:  Procedure Laterality Date  . broken pelvis  2014  . SHOULDER SURGERY  2014    Scheduled Meds: . chlorhexidine  60 mL Topical Once  . dexamethasone (DECADRON) injection  8 mg Intravenous Once   Continuous Infusions: . acetaminophen    .  ceFAZolin (ANCEF) IV    . lactated ringers    . lactated ringers 10 mL/hr at 09/11/20 1151   PRN Meds:.fentaNYL (SUBLIMAZE) injection, ondansetron (ZOFRAN) IV, oxyCODONE **OR** oxyCODONE  Allergies  Allergen Reactions  . Banana Itching    Pt reports itching in mouth and throat   Social History   Socioeconomic History  . Marital status: Married  Spouse name: Not on file  . Number of children: Not on file  . Years of education: Not on file  . Highest education level: Not on file  Occupational History  . Not on file  Tobacco Use  . Smoking status: Never Smoker  . Smokeless tobacco: Never Used  Vaping Use  . Vaping Use: Never used  Substance and Sexual Activity  . Alcohol use: Not Currently  . Drug use: Never  . Sexual activity: Yes    Birth control/protection: I.U.D.  Other Topics Concern  . Not on file  Social History Narrative  . Not on file   Social Determinants of Health   Financial Resource Strain: Not on file  Food Insecurity: Not on file  Transportation Needs: Not on file  Physical Activity: Not on file  Stress: Not on file  Social Connections: Not on file   Intimate Partner Violence: Not on file    Vitals BP (!) 166/96   Pulse (!) 110   Temp 98.4 F (36.9 C) (Oral)   Resp 19   Ht '5\' 10"'  (1.778 m)   Wt 80.4 kg   SpO2 98%   BMI 25.43 kg/m    Physical Exam Constitutional:  Not in acute distress, eating lunch in a couch     Comments:   Cardiovascular:     Rate and Rhythm: Normal rate and regular rhythm.     Heart sounds: No murmur heard.   Pulmonary:     Effort: Pulmonary effort is normal.     Comments: clear air entry bilaterally   Abdominal:     Palpations: Abdomen is soft. BS+    Tenderness: Non tender   Musculoskeletal:        General: Rt hip/thigh with a bandage, haemovac+, minimal erythema and tenderness +  Skin:    Comments: no obvious lesions or rashes  Neurological:     General: No focal deficit present.   Psychiatric:   WNL  Pertinent Microbiology Gram stain & culture from right hip aspiration done in office on 08/31/2020  CULTURE, ANAEROBIC BACTERIA W/GRAM STAIN       Micro Number:          [id number]   Test Status:               Final   Specimen Source:    R hip   Specimen Quality:     Adequate   Gram Stain:               Few White blood cells seen                                     No epithelial cells seen                                     No organisms seen    Result:                       No anaerobes isolated.     CULTURE, AEROBIC BACTERIA       Micro Number:          [id number]   Test Status:               Final   Specimen Source:    R hip   Specimen  Quality:     Adequate   Result:                       Light growth of Staphylococcus aureus                                     This isolate demonstrates inducible clindamycin resistance.                                                  S.aureus                                                 ----------------                                                 INT   MIC    CIPROFLOXACIN               S     <=0.5    CLINDAMYCIN                     R     NR    ERYTHROMYCIN               R     4    GENTAMICIN                      S     <=0.5    LEVOFLOXACIN                 S     <=0.12    OXACILLIN                                      S     0.5 **1    TETRACYCLINE                 S     <=1    TRIMETHOPRIM/SULFA     S     <=10    VANCOMYCIN                    S     1  S=Susceptible  I=Intermediate  R=Resistant  * = Not Tested NR = Not Reported  **NN = See Therapy Comments   THERAPY COMMENTS  Note 1: Oxacillin-susceptible staphylococci are susceptible to other penicillinase-stable penicillins (e.g. Methicillin, Nafcillin), beta-lactam/beta-lactamase inhibitor combinations, and cephems with staphylococcal indications, including Cefazolin.    Results for orders placed or performed during the hospital encounter of 09/09/20  SARS CORONAVIRUS 2 (TAT 6-24 HRS) Nasopharyngeal Nasopharyngeal Swab     Status: Abnormal   Collection Time: 09/09/20 12:13 PM   Specimen: Nasopharyngeal Swab  Result Value Ref Range Status   SARS Coronavirus 2 POSITIVE (A) NEGATIVE Final    Comment: (NOTE) SARS-CoV-2 target nucleic acids are DETECTED.  The SARS-CoV-2 RNA is generally detectable in upper and lower respiratory specimens during the acute phase of infection. Positive results are indicative of the presence of SARS-CoV-2 RNA. Clinical correlation with patient history and other diagnostic information is  necessary to determine patient infection status. Positive results do not rule out bacterial infection or co-infection with other viruses.  The expected result is Negative.  Fact Sheet for Patients: SugarRoll.be  Fact Sheet for Healthcare Providers: https://www.woods-mathews.com/  This test is not yet approved or cleared by the [place] FDA and  has been authorized for detection and/or diagnosis of SARS-CoV-2 by FDA under an Emergency Use Authorization (EUA). This EUA will remain  in  effect (meaning this test can be used) for the duration of the COVID-19 declaration under Section 564(b)(1) of the Act, 21 U. S.C. section 360bbb-3(b)(1), unless the authorization is terminated or revoked sooner.   Performed at [facility], [place] [address], [place],  [address]     Pertinent Lab seen by me: CBC Latest Ref Rng & Units 09/11/2020 12/17/2019 12/16/2019  WBC 4.0 - 10.5 K/uL 8.2 10.8(H) 8.4  Hemoglobin 12.0 - 15.0 g/dL 10.3(L) 9.4(L) 11.4(L)  Hematocrit 36.0 - 46.0 % 32.6(L) 28.4(L) 34.9(L)  Platelets 150 - 400 K/uL 351 214 238   CMP Latest Ref Rng & Units 09/11/2020 12/17/2019  Glucose 70 - 99 mg/dL 97 83  BUN 6 - 20 mg/dL 13 11  Creatinine 0.44 - 1.00 mg/dL 0.67 0.78  Sodium 135 - 145 mmol/L 138 137  Potassium 3.5 - 5.1 mmol/L 3.7 3.7  Chloride 98 - 111 mmol/L 100 106  CO2 22 - 32 mmol/L 24 24  Calcium 8.9 - 10.3 mg/dL 9.5 8.5(L)  Total Protein 6.5 - 8.1 g/dL - 5.2(L)  Total Bilirubin 0.3 - 1.2 mg/dL - 0.3  Alkaline Phos 38 - 126 U/L - 104  AST 15 - 41 U/L - 28  ALT 0 - 44 U/L - 18     Pertinent Imagings/Other Imagings Plain films and CT images have been personally visualized and interpreted; radiology reports have been reviewed. Decision making incorporated into the Impression / Recommendations.  I have spent greater than 60 minutes for this patient encounter including review of prior medical records with greater than 50% of time being face to face and coordination of their care.  Electronically signed by:   Rosiland Oz, MD Infectious Disease Physician [facility] Pager: [phone]

## 2020-09-11 NOTE — Anesthesia Postprocedure Evaluation (Signed)
Anesthesia Post Note  Patient: Tiffany Leonard  Procedure(s) Performed: Right hip arthrotomy, irrigation and debridement-anterior approach (Right Hip)     Patient location during evaluation: PACU Anesthesia Type: General Level of consciousness: awake and alert Pain management: pain level controlled Vital Signs Assessment: post-procedure vital signs reviewed and stable Respiratory status: spontaneous breathing, nonlabored ventilation, respiratory function stable and patient connected to nasal cannula oxygen Cardiovascular status: blood pressure returned to baseline and stable Postop Assessment: no apparent nausea or vomiting Anesthetic complications: no   No complications documented.  Last Vitals:  Vitals:   09/11/20 1602 09/11/20 1656  BP: (!) 157/95 (!) 145/90  Pulse: (!) 116 (!) 101  Resp: 16 15  Temp: 36.9 C 36.9 C  SpO2: 97% 98%    Last Pain:  Vitals:   09/11/20 1656  TempSrc: Oral  PainSc:                  Alix Stowers S

## 2020-09-12 ENCOUNTER — Inpatient Hospital Stay: Payer: Self-pay

## 2020-09-12 ENCOUNTER — Encounter (HOSPITAL_COMMUNITY): Payer: Self-pay | Admitting: Orthopedic Surgery

## 2020-09-12 LAB — CBC
HCT: 31.8 % — ABNORMAL LOW (ref 36.0–46.0)
Hemoglobin: 10 g/dL — ABNORMAL LOW (ref 12.0–15.0)
MCH: 27.5 pg (ref 26.0–34.0)
MCHC: 31.4 g/dL (ref 30.0–36.0)
MCV: 87.6 fL (ref 80.0–100.0)
Platelets: 329 10*3/uL (ref 150–400)
RBC: 3.63 MIL/uL — ABNORMAL LOW (ref 3.87–5.11)
RDW: 14 % (ref 11.5–15.5)
WBC: 7.5 10*3/uL (ref 4.0–10.5)
nRBC: 0 % (ref 0.0–0.2)

## 2020-09-12 LAB — SEDIMENTATION RATE: Sed Rate: 110 mm/hr — ABNORMAL HIGH (ref 0–22)

## 2020-09-12 LAB — BASIC METABOLIC PANEL
Anion gap: 11 (ref 5–15)
BUN: 8 mg/dL (ref 6–20)
CO2: 24 mmol/L (ref 22–32)
Calcium: 9 mg/dL (ref 8.9–10.3)
Chloride: 101 mmol/L (ref 98–111)
Creatinine, Ser: 0.56 mg/dL (ref 0.44–1.00)
GFR, Estimated: >60 mL/min (ref >60)
Glucose, Bld: 109 mg/dL — ABNORMAL HIGH (ref 70–99)
Potassium: 4.1 mmol/L (ref 3.5–5.1)
Sodium: 136 mmol/L (ref 135–145)

## 2020-09-12 LAB — C-REACTIVE PROTEIN: CRP: 7.2 mg/dL — ABNORMAL HIGH (ref <1.0)

## 2020-09-12 MED ORDER — CEFAZOLIN SODIUM-DEXTROSE 2-4 GM/100ML-% IV SOLN
2.0000 g | Freq: Three times a day (TID) | INTRAVENOUS | Status: DC
  Administered 2020-09-12 – 2020-09-13 (×5): 2 g via INTRAVENOUS
  Filled 2020-09-12 (×5): qty 100

## 2020-09-12 MED ORDER — HYDROCODONE-ACETAMINOPHEN 10-325 MG PO TABS
1.0000 | ORAL_TABLET | Freq: Four times a day (QID) | ORAL | Status: DC | PRN
  Administered 2020-09-12 – 2020-09-13 (×5): 1 via ORAL
  Filled 2020-09-12 (×5): qty 1

## 2020-09-12 MED ORDER — HYDROCODONE-ACETAMINOPHEN 10-325 MG PO TABS
2.0000 | ORAL_TABLET | Freq: Four times a day (QID) | ORAL | Status: DC | PRN
Start: 2020-09-12 — End: 2020-09-13

## 2020-09-12 NOTE — Progress Notes (Signed)
PHARMACY CONSULT NOTE FOR:  OUTPATIENT  PARENTERAL ANTIBIOTIC THERAPY (OPAT)  Indication: Rt hip septic arthritis  Regimen: Cefazolin 2 gm  IV Q 8 hours  End date: 10/09/20  IV antibiotic discharge orders are pended. To discharging provider:  please sign these orders via discharge navigator,  Select New Orders & click on the button choice - Manage This Unsigned Work.     Thank you for allowing pharmacy to be a part of this patient's care.  Sharin Mons, PharmD, BCPS, BCIDP Infectious Diseases Clinical Pharmacist Phone: [phone] 09/12/2020, 9:01 AM

## 2020-09-12 NOTE — Progress Notes (Signed)
Arther Abbott PA for Dr Lequita Halt  made aware of culture growing staph Aureus, received call from Micro biology at [facility] RN

## 2020-09-12 NOTE — Progress Notes (Signed)
PICC consent signed. Patient would like to have PICC place tomorrow morning. Aware PICC RN start at 11am.

## 2020-09-12 NOTE — Evaluation (Addendum)
Physical Therapy Evaluation Patient Details Name: Tiffany Leonard MRN: [id number] DOB: 30-Sep-1985 Today's Date: 09/12/2020   History of Present Illness  35 y.o. female admitted with septic arthritis of R hip, s/p arthrotomy and I&D R hip on 09/12/20.  Clinical Impression  Pt admitted with above diagnosis. Pt ambulated 40' with RW, no loss of balance. Initiated HEP for RLE strengthening. Good progress expected. Pt currently with functional limitations due to the deficits listed below (see PT Problem List). Pt will benefit from skilled PT to increase their independence and safety with mobility to allow discharge to the venue listed below.       Follow Up Recommendations Follow surgeon's recommendation for DC plan and follow-up therapies    Equipment Recommendations  None recommended by PT    Recommendations for Other Services       Precautions / Restrictions Precautions Precautions: Fall Restrictions Weight Bearing Restrictions: No      Mobility  Bed Mobility Overal bed mobility: Needs Assistance Bed Mobility: Supine to Sit     Supine to sit: Min assist;HOB elevated     General bed mobility comments: assist to support RLE    Transfers Overall transfer level: Needs assistance Equipment used: Rolling walker (2 wheeled) Transfers: Sit to/from Stand Sit to Stand: From elevated surface;Min guard         General transfer comment: VCs hand placement  Ambulation/Gait   Gait Distance (Feet): 40 Feet Assistive device: Rolling walker (2 wheeled) Gait Pattern/deviations: Step-to pattern;Decreased stride length;Decreased weight shift to right Gait velocity: decr   General Gait Details: steady with RW, no loss of balance  Stairs            Wheelchair Mobility    Modified Rankin (Stroke Patients Only)       Balance Overall balance assessment: Modified Independent                                           Pertinent Vitals/Pain  Pain Assessment: 0-10 Pain Score: 2  Pain Location: R hip Pain Descriptors / Indicators: Sore Pain Intervention(s): Limited activity within patient's tolerance;Monitored during session;Premedicated before session;Repositioned    Home Living Family/patient expects to be discharged to:: Private residence Living Arrangements: Children;Spouse/significant other Available Help at Discharge: Family;Available 24 hours/day   Home Access: Level entry     Home Layout: One level Home Equipment: Walker - 2 wheels;Walker - 4 wheels Additional Comments: pt has a 34 year old and 5 month old; 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 due to R hip pain    Prior Function Level of Independence: Independent with assistive device(s)         Comments: was walking with RW keeping weight off of RLE for 6 weeks PTA; DCing to parent's home where she'll have 24* assist, no stairs at that home     Hand Dominance        Extremity/Trunk Assessment   Upper Extremity Assessment Upper Extremity Assessment: Overall WFL for tasks assessed    Lower Extremity Assessment Lower Extremity Assessment: RLE deficits/detail RLE Deficits / Details: hip flexion AAROM ~40*, knee ext -3/5, pt reports R quads feel very weak 2* disuse for 6 weeks RLE Sensation: WNL RLE Coordination: WNL    Cervical / Trunk Assessment Cervical / Trunk Assessment: Normal  Communication   Communication: No difficulties  Cognition  Arousal/Alertness: Awake/alert Behavior During Therapy: WFL for tasks assessed/performed Overall Cognitive Status: Within Functional Limits for tasks assessed                                        General Comments      Exercises General Exercises - Lower Extremity Ankle Circles/Pumps: AROM;Both;10 reps;Supine Quad Sets: AROM;Right;5 reps;Supine Heel Slides: AAROM;Right;Supine;5 reps Hip ABduction/ADduction: AAROM;Right;5 reps;Supine   Assessment/Plan     PT Assessment Patient needs continued PT services  PT Problem List Decreased strength;Decreased range of motion;Decreased activity tolerance;Decreased mobility;Decreased knowledge of use of DME;Pain       PT Treatment Interventions DME instruction;Gait training;Therapeutic exercise;Therapeutic activities;Patient/family education    PT Goals (Current goals can be found in the Care Plan section)  Acute Rehab 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 PT Goal Formulation: With patient Time For Goal Achievement: 09/26/20 Potential to Achieve Goals: Good    Frequency Min 5X/week   Barriers to discharge        Co-evaluation               AM-PAC PT "6 Clicks" Mobility  Outcome Measure Help needed turning from your back to your side while in a flat bed without using bedrails?: A Little Help needed moving from lying on your back to sitting on the side of a flat bed without using bedrails?: A Little Help needed moving to and from a bed to a chair (including a wheelchair)?: A Little Help needed standing up from a chair using your arms (e.g., wheelchair or bedside chair)?: A Little Help needed to walk in hospital room?: A Little Help needed climbing 3-5 steps with a railing? : A Little 6 Click Score: 18    End of Session Equipment Utilized During Treatment: Gait belt Activity Tolerance: Patient tolerated treatment well Patient left: in chair;with call bell/phone within reach;with chair alarm set Nurse Communication: Mobility status PT Visit Diagnosis: Difficulty in walking, not elsewhere classified (R26.2);Pain Pain - Right/Left: Right Pain - part of body: Hip    Time: 1208-1233 PT Time Calculation (min) (ACUTE ONLY): 25 min   Charges:   PT Evaluation $PT Eval Low Complexity: 1 Low PT Treatments $Gait Training: 8-22 mins       Ralene Bathe Kistler PT 09/12/2020  Acute Rehabilitation Services Pager [phone] Office [phone]

## 2020-09-12 NOTE — Plan of Care (Signed)
°  Problem: Clinical Measurements: °Goal: Respiratory complications will improve °Outcome: Progressing °  °Problem: Pain Managment: °Goal: General experience of comfort will improve °Outcome: Progressing °  °Problem: Safety: °Goal: Ability to remain free from injury will improve °Outcome: Progressing °  °

## 2020-09-12 NOTE — TOC Progression Note (Signed)
Transition of Care [facility]) - Progression Note    Patient Details  Name: Tiffany Leonard MRN: [id number] Date of Birth: 1985-12-06  [facility] CM/SW Contact  Amada Jupiter, LCSW Phone Number: 09/12/2020, 3:35 PM  Clinical Narrative:    Have spoken with pt to introduce self/ role and discuss dc needs.  Pt aware she will dc home with IV abx.  Agreeable with this plan and aware I will arrange [facility] visits. Have alerted Jeri Modena with [employer] who will arrange IV abx coverage and complete teaching prior to dc.  HHRN visits to be provided via [facility].   Pt plans to dc to her parents home in [place], [place].  Have alerted agencies.  Anticipate dc tomorrow.     Barriers to Discharge: Continued Medical Work up  Expected Discharge Plan and Services                                     HH Arranged: IV Antibiotics,RN HH Agency: [employer],Other - See comment (and [facility]) Date HH Agency Contacted: 09/12/20 Time HH Agency Contacted: 1000 Representative spoke with at [facility] Agency: Jeri Modena   Social Determinants of Health (SDOH) Interventions    Readmission Risk Interventions Readmission Risk Prevention Plan 09/12/2020  Post Dischage Appt Complete  Medication Screening Complete  Transportation Screening Complete  Some recent data might be hidden

## 2020-09-12 NOTE — Progress Notes (Signed)
PICC order received, primary Rn aware.

## 2020-09-12 NOTE — Progress Notes (Signed)
Diagnosis: Rt Hip Septic Arthritis   Culture Result: MSSA  Allergies  Allergen Reactions  . Banana Itching    Pt reports itching in mouth and throat    OPAT Orders Discharge antibiotics to be given via PICC line Discharge antibiotics: Cefazolin  Per pharmacy protocol   Duration: 4 weeks  End Date: 2/21  [facility] Care Per Protocol:  Home health RN for IV administration and teaching; PICC line care and labs.    Labs weekly while on IV antibiotics: X_ CBC with differential X_ BMP __ CMP X__ CRP X__ ESR __ Vancomycin trough __ CK  __ Please pull PIC at completion of IV antibiotics X__ Please leave PIC in place until doctor has seen patient or been notified  Fax weekly labs to [phone]  Clinic Follow Up Appt: 09/22/20  @ 3:30 pm

## 2020-09-12 NOTE — Progress Notes (Signed)
   Subjective: 1 Day Post-Op Procedure(s) (LRB): Right hip arthrotomy, irrigation and debridement-anterior approach (Right) Patient reports pain as mild.   Patient seen in rounds by Dr. Lequita Halt. Patient is well, and has had no acute complaints or problems. No acute overnight events. Patient denies SOB, chest pain, or calf pain. Infectious disease to see patient today. Will be seen by physical therapy today.   Objective: Vital signs in last 24 hours: Temp:  [97.6 F (36.4 C)-98.5 F (36.9 C)] 98.2 F (36.8 C) (01/25 0549) Pulse Rate:  [77-142] 105 (01/25 0549) Resp:  [15-27] 16 (01/25 0549) BP: (134-166)/(88-100) 151/88 (01/25 0549) SpO2:  [97 %-100 %] 100 % (01/25 0549) Weight:  [80.4 kg] 80.4 kg (01/24 1022)  Intake/Output from previous day:  Intake/Output Summary (Last 24 hours) at 09/12/2020 0706 Last data filed at 09/12/2020 0648 Gross per 24 hour  Intake 3165.44 ml  Output 1945 ml  Net 1220.44 ml     Intake/Output this shift: No intake/output data recorded.  Labs: Recent Labs    09/11/20 1040 09/12/20 0317  HGB 10.3* 10.0*   Recent Labs    09/11/20 1040 09/12/20 0317  WBC 8.2 7.5  RBC 3.74* 3.63*  HCT 32.6* 31.8*  PLT 351 329   Recent Labs    09/11/20 1040 09/12/20 0317  NA 138 136  K 3.7 4.1  CL 100 101  CO2 24 24  BUN 13 8  CREATININE 0.67 0.56  GLUCOSE 97 109*  CALCIUM 9.5 9.0   No results for input(s): LABPT, INR in the last 72 hours.  Exam: General - Patient is Alert, Appropriate and Oriented Extremity - Neurologically intact Neurovascular intact Sensation intact distally Intact pulses distally Dorsiflexion/Plantar flexion intact Dressing - dressing C/D/I Motor Function - intact, moving foot and toes well on exam.   Past Medical History:  Diagnosis Date  . Anxiety     Assessment/Plan: 1 Day Post-Op Procedure(s) (LRB): Right hip arthrotomy, irrigation and debridement-anterior approach (Right) Principal Problem:   Septic  arthritis of hip (HCC)  Estimated body mass index is 25.43 kg/m as calculated from the following:   Height as of this encounter: 5\' 10"  (1.778 m).   Weight as of this encounter: 80.4 kg.   DVT Prophylaxis - Aspirin Weight bearing as tolerated. Begin physical therapy today.  Patient to be seen by infectious disease today and we will follow their recommendations - Thank You.   Plan is to go Home after hospital stay.  Patient to follow up with Dr. in two weeks following discharge.   Lequita Halt, MBA, PA-C Orthopedic Surgery 09/12/2020, 7:06 AM

## 2020-09-13 DIAGNOSIS — M009 Pyogenic arthritis, unspecified: Secondary | ICD-10-CM | POA: Diagnosis not present

## 2020-09-13 DIAGNOSIS — A327 Listerial sepsis: Secondary | ICD-10-CM | POA: Diagnosis not present

## 2020-09-13 LAB — BASIC METABOLIC PANEL
Anion gap: 14 (ref 5–15)
BUN: 12 mg/dL (ref 6–20)
CO2: 25 mmol/L (ref 22–32)
Calcium: 8.7 mg/dL — ABNORMAL LOW (ref 8.9–10.3)
Chloride: 99 mmol/L (ref 98–111)
Creatinine, Ser: 0.63 mg/dL (ref 0.44–1.00)
GFR, Estimated: >60 mL/min (ref >60)
Glucose, Bld: 108 mg/dL — ABNORMAL HIGH (ref 70–99)
Potassium: 3.6 mmol/L (ref 3.5–5.1)
Sodium: 138 mmol/L (ref 135–145)

## 2020-09-13 LAB — CBC
HCT: 33.7 % — ABNORMAL LOW (ref 36.0–46.0)
Hemoglobin: 10.4 g/dL — ABNORMAL LOW (ref 12.0–15.0)
MCH: 27.2 pg (ref 26.0–34.0)
MCHC: 30.9 g/dL (ref 30.0–36.0)
MCV: 88.2 fL (ref 80.0–100.0)
Platelets: 320 10*3/uL (ref 150–400)
RBC: 3.82 MIL/uL — ABNORMAL LOW (ref 3.87–5.11)
RDW: 14.3 % (ref 11.5–15.5)
WBC: 8.9 10*3/uL (ref 4.0–10.5)
nRBC: 0 % (ref 0.0–0.2)

## 2020-09-13 MED ORDER — CEFAZOLIN IV (FOR PTA / DISCHARGE USE ONLY): 2.0000 g | Freq: Three times a day (TID) | INTRAVENOUS | 0 refills | Status: AC

## 2020-09-13 MED ORDER — CEFAZOLIN SODIUM-DEXTROSE 2-4 GM/100ML-% IV SOLN: 2.0000 g | Freq: Three times a day (TID) | INTRAVENOUS | 0 refills | Status: DC

## 2020-09-13 MED ORDER — SODIUM CHLORIDE 0.9% FLUSH: 10.0000 mL | INTRAVENOUS | Status: DC | PRN

## 2020-09-13 MED ORDER — CHLORHEXIDINE GLUCONATE CLOTH 2 % EX PADS
6.0000 | MEDICATED_PAD | Freq: Every day | CUTANEOUS | Status: DC
  Administered 2020-09-13: 6 via TOPICAL

## 2020-09-13 MED ORDER — ASPIRIN EC 81 MG PO TBEC: 81.0000 mg | DELAYED_RELEASE_TABLET | Freq: Two times a day (BID) | ORAL | 0 refills | Status: AC

## 2020-09-13 MED ORDER — METHOCARBAMOL 500 MG PO TABS: 500.0000 mg | ORAL_TABLET | Freq: Four times a day (QID) | ORAL | 0 refills | Status: DC | PRN

## 2020-09-13 NOTE — Progress Notes (Addendum)
   Subjective: 2 Days Post-Op Procedure(s) (LRB): Right hip arthrotomy, irrigation and debridement-anterior approach (Right) Patient reports pain as mild.   Patient seen in rounds for Dr. Lequita Halt. Patient is well, and has had no acute complaints or problems other than soreness in the right hip. Denies chest pain or SOB. No issues overnight, states she is ready to go home.  Plan is to go Home after hospital stay.  Objective: Vital signs in last 24 hours: Temp:  [97.7 F (36.5 C)-98.4 F (36.9 C)] 98.4 F (36.9 C) (01/26 0657) Pulse Rate:  [64-103] 103 (01/26 0657) Resp:  [16-18] 16 (01/26 0657) BP: (130-154)/(83-98) 154/98 (01/26 0657) SpO2:  [97 %-100 %] 100 % (01/26 0657)  Intake/Output from previous day:  Intake/Output Summary (Last 24 hours) at 09/13/2020 0757 Last data filed at 09/13/2020 0600 Gross per 24 hour  Intake 1523.36 ml  Output 2390 ml  Net -866.64 ml    Intake/Output this shift: No intake/output data recorded.  Labs: Recent Labs    09/11/20 1040 09/12/20 0317 09/13/20 0315  HGB 10.3* 10.0* 10.4*   Recent Labs    09/12/20 0317 09/13/20 0315  WBC 7.5 8.9  RBC 3.63* 3.82*  HCT 31.8* 33.7*  PLT 329 320   Recent Labs    09/12/20 0317 09/13/20 0315  NA 136 138  K 4.1 3.6  CL 101 99  CO2 24 25  BUN 8 12  CREATININE 0.56 0.63  GLUCOSE 109* 108*  CALCIUM 9.0 8.7*   No results for input(s): LABPT, INR in the last 72 hours.  Exam: General - Patient is Alert and Oriented Extremity - Neurologically intact Neurovascular intact Sensation intact distally Dorsiflexion/Plantar flexion intact Dressing/Incision - clean, dry, no drainage. New aquacel applied to incision. Motor Function - intact, moving foot and toes well on exam.   Past Medical History:  Diagnosis Date  . Anxiety     Assessment/Plan: 2 Days Post-Op Procedure(s) (LRB): Right hip arthrotomy, irrigation and debridement-anterior approach (Right) Principal Problem:   Septic  arthritis of hip (HCC)  Estimated body mass index is 25.43 kg/m as calculated from the following:   Height as of this encounter: 5\' 10"  (1.778 m).   Weight as of this encounter: 80.4 kg. Up with therapy  DVT Prophylaxis - Aspirin Weight-bearing as tolerated Hemovac drain pulled without difficulty.  Intraoperative cultures showed staph aureus, identical to aspirate cultures obtained in office.   ID recommendations: cefazolin via PICC line x 6 weeks.  PICC line order placed yesterday, RN plans to do this today. Ready for discharge to home with [facility] once PICC line in place.  Will follow-[facility] with [facility] 2 weeks following surgery. The PDMP database was reviewed today prior to any opioid medications being prescribed to this patient.  [place], PA-C Orthopedic Surgery [phone] 09/13/2020, 7:57 AM

## 2020-09-13 NOTE — Progress Notes (Signed)
Physical Therapy Treatment Patient Details Name: Tiffany Leonard MRN: [id number] DOB: [birth year] Today's Date: 09/13/2020    History of Present Illness 35 y.o. female admitted with septic arthritis of R hip, s/p arthrotomy and I&D R hip on 09/12/20.    PT Comments    Pt ambulated 34' with RW, distance limited by RLE pain and fatigue. Reviewed HEP, pt demonstrates good understanding. Pt is ready to DC home from PT standpoint.   Follow Up Recommendations  Follow surgeon's recommendation for DC plan and follow-up therapies     Equipment Recommendations  None recommended by PT    Recommendations for Other Services       Precautions / Restrictions Precautions Precautions: Fall Restrictions Weight Bearing Restrictions: No Other Position/Activity Restrictions: WBAT    Mobility  Bed Mobility Overal bed mobility: Needs Assistance Bed Mobility: Supine to Sit     Supine to sit: HOB elevated     General bed mobility comments: assist to support RLE  Transfers Overall transfer level: Needs assistance Equipment used: Rolling walker (2 wheeled) Transfers: Sit to/from Stand Sit to Stand: From elevated surface;Min guard         General transfer comment: VCs hand placement  Ambulation/Gait   Gait Distance (Feet): 40 Feet Assistive device: Rolling walker (2 wheeled) Gait Pattern/deviations: Step-to pattern;Decreased stride length;Decreased weight shift to right Gait velocity: decr   General Gait Details: steady with RW, no loss of balance, distance limited by RLE pain   Stairs             Wheelchair Mobility    Modified Rankin (Stroke Patients Only)       Balance Overall balance assessment: Modified Independent                                          Cognition Arousal/Alertness: Awake/alert Behavior During Therapy: WFL for tasks assessed/performed Overall Cognitive Status: Within Functional Limits for tasks assessed                                         Exercises General Exercises - Lower Extremity Ankle Circles/Pumps: AROM;Both;10 reps;Supine Quad Sets: AROM;Right;5 reps;Supine Short Arc Quad: AROM;Right;5 reps;Supine Long Arc Quad: AROM;Right;5 reps;Seated Heel Slides: AAROM;Right;Supine;10 reps Hip ABduction/ADduction: AAROM;Right;Supine;10 reps    General Comments        Pertinent Vitals/Pain Pain Score: 4  Pain Location: R hip Pain Descriptors / Indicators: Sore Pain Intervention(s): Limited activity within patient's tolerance;Monitored during session;Premedicated before session;Repositioned    Home Living                      Prior Function            PT Goals (current goals can now be found in the care plan section) Acute Rehab 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 PT Goal Formulation: With patient Time For Goal Achievement: 09/26/20 Potential to Achieve Goals: Good Progress towards PT goals: Progressing toward goals    Frequency    Min 5X/week      PT Plan Current plan remains appropriate    Co-evaluation              AM-PAC PT "6 Clicks" Mobility   Outcome Measure  Help needed turning from your back to your side while  in a flat bed without using bedrails?: A Little Help needed moving from lying on your back to sitting on the side of a flat bed without using bedrails?: A Little Help needed moving to and from a bed to a chair (including a wheelchair)?: None Help needed standing up from a chair using your arms (e.g., wheelchair or bedside chair)?: None Help needed to walk in hospital room?: None Help needed climbing 3-5 steps with a railing? : A Little 6 Click Score: 21    End of Session Equipment Utilized During Treatment: Gait belt Activity Tolerance: Patient tolerated treatment well Patient left: in chair;with call bell/phone within reach;with chair alarm set Nurse Communication: Mobility status PT Visit Diagnosis:  Difficulty in walking, not elsewhere classified (R26.2);Pain Pain - Right/Left: Right Pain - part of body: Hip     Time: 0923-0950 PT Time Calculation (min) (ACUTE ONLY): 27 min  Charges:  $Gait Training: 8-22 mins $Therapeutic Exercise: 8-22 mins                     Ralene Bathe Kistler PT 09/13/2020  Acute Rehabilitation Services Pager [phone] Office [phone]

## 2020-09-13 NOTE — Progress Notes (Signed)
Peripherally Inserted Central Catheter Placement  The IV Nurse has discussed with the patient and/or persons authorized to consent for the patient, the purpose of this procedure and the potential benefits and risks involved with this procedure.  The benefits include less needle sticks, lab draws from the catheter, and the patient may be discharged home with the catheter. Risks include, but not limited to, infection, bleeding, blood clot (thrombus formation), and puncture of an artery; nerve damage and irregular heartbeat and possibility to perform a PICC exchange if needed/ordered by physician.  Alternatives to this procedure were also discussed.  Bard Power PICC patient education guide, fact sheet on infection prevention and patient information card has been provided to patient /or left at bedside.  PICC inserted by Terie Purser, RN   PICC Placement Documentation  PICC Single Lumen 09/13/20 PICC Right Basilic 39 cm 0 cm (Active)  Indication for Insertion or Continuance of Line Home intravenous therapies (PICC only) 09/13/20 1217  Exposed Catheter (cm) 0 cm 09/13/20 1217  Site Assessment Clean;Dry;Intact 09/13/20 1217  Line Status Flushed;Saline locked;Blood return noted 09/13/20 1217  Dressing Type Transparent 09/13/20 1217  Dressing Status Clean;Dry;Intact 09/13/20 1217  Antimicrobial disc in place? Yes 09/13/20 1217  Safety Lock Not Applicable 09/13/20 1217  Line Care Connections checked and tightened 09/13/20 1217  Line Adjustment (NICU/IV Team Only) No 09/13/20 1217  Dressing Intervention New dressing 09/13/20 1217  Dressing Change Due 09/20/20 09/13/20 1217       Dora Clauss, Lajean Manes 09/13/2020, 12:18 PM

## 2020-09-13 NOTE — Progress Notes (Signed)
ID Brief Note   Chart reviewed, planned for PICC today, no further surgical intervention from Ortho  Plan to continue IV cefazolin through PICC line for 4 weeks. End date 10/09/20  A follow up with [facility] has been made, patient is aware of appointment  Will sign off for now  Please call with questions  Odette Fraction, MD Infectious Diseases  [facility]

## 2020-09-14 NOTE — Discharge Summary (Signed)
Physician Discharge Summary   Patient ID: Tiffany Leonard MRN: [id number] DOB/AGE: 05/18/1986 35 y.o.  Admit date: 09/11/2020 Discharge date: 09/13/2020  Primary Diagnosis: Septic arthritis, right hip   Admission Diagnoses:  Past Medical History:  Diagnosis Date  . Anxiety    Discharge Diagnoses:   Principal Problem:   Septic arthritis of hip ([place])  Estimated body mass index is 25.43 kg/m as calculated from the following:   Height as of this encounter: _0  (1.778 m).   Weight as of this encounter: 80.4 kg.  Procedure:  Procedure(s) (LRB): Right hip arthrotomy, irrigation and debridement-anterior approach (Right)   Consults: Infectious disease  HPI: The patient is a 35 year old female who presented to my office last week with severe right hip pain.  She had been seen in another orthopedic practice and had pretty severe hip pain and had an intra-articular hip injection  approximately a month ago.  She had increased pain and presented to our practice approximately a week before seeing me and had an MRI scan which showed fluid in the joint.  She had an aspiration which showed Staph aureus.  She had significant erosive  changes in the hip joint on her MRI and on her plain films.  She presents now for open arthrotomy, irrigation and debridement.  Laboratory Data: Admission on 09/11/2020, Discharged on 09/13/2020  Component Date Value Ref Range Status  . WBC 09/11/2020 8.2  4.0 - 10.5 K/uL Final  . RBC 09/11/2020 3.74* 3.87 - 5.11 MIL/uL Final  . Hemoglobin 09/11/2020 10.3* 12.0 - 15.0 g/dL Final  . HCT 09/11/2020 32.6* 36.0 - 46.0 % Final  . MCV 09/11/2020 87.2  80.0 - 100.0 fL Final  . MCH 09/11/2020 27.5  26.0 - 34.0 pg Final  . MCHC 09/11/2020 31.6  30.0 - 36.0 g/dL Final  . RDW 09/11/2020 14.5  11.5 - 15.5 % Final  . Platelets 09/11/2020 351  150 - 400 K/uL Final  . nRBC 09/11/2020 0.0  0.0 - 0.2 % Final   Performed at [facility], [place]  [address], [place], [place] [address]  . Preg Test, Ur 09/11/2020 NEGATIVE  NEGATIVE Final   Comment:        THE SENSITIVITY OF THIS METHODOLOGY IS >20 mIU/mL. Performed at [facility], [place] [address] [place], [place] [address]   . MRSA, PCR 09/11/2020 NEGATIVE  NEGATIVE Final  . Staphylococcus aureus 09/11/2020 NEGATIVE  NEGATIVE Final   Comment: (NOTE) The Xpert SA Assay (FDA approved for NASAL specimens in patients 40 years of age and older), is one component of a comprehensive surveillance program. It is not intended to diagnose infection nor to guide or monitor treatment. Performed at [facility], [place] [address], [place], [place] [address]   . Sodium 09/11/2020 138  135 - 145 mmol/L Final  . Potassium 09/11/2020 3.7  3.5 - 5.1 mmol/L Final  . Chloride 09/11/2020 100  98 - 111 mmol/L Final  . CO2 09/11/2020 24  22 - 32 mmol/L Final  . Glucose, Bld 09/11/2020 97  70 - 99 mg/dL Final   Glucose reference range applies only to samples taken after fasting for at least 8 hours.  . BUN 09/11/2020 13  6 - 20 mg/dL Final  . Creatinine, Ser 09/11/2020 0.67  0.44 - 1.00 mg/dL Final  . Calcium 09/11/2020 9.5  8.9 - 10.3 mg/dL Final  . GFR, Estimated 09/11/2020 >60  >60 mL/min Final   Comment: (NOTE) Calculated using  the CKD-EPI Creatinine Equation (2021)   . Anion gap 09/11/2020 14  5 - 15 Final   Performed at [facility], [place] [address], [place], [place] [address]  . Specimen Description 09/11/2020    Final                   Value:TISSUE RIGHT HIP Performed at [place] [address], [place], [place] [address]   . Special Requests 09/11/2020    Final                   Value:NONE Performed at [facility], [place] [address], [place], [place] [address]   . Gram Stain 09/11/2020    Final                   Value:RARE WBC PRESENT,BOTH PMN AND MONONUCLEAR NO ORGANISMS SEEN Performed at  [facility], [place] [address] [place], [place] [address]   . Culture 09/11/2020    Final                   Value:RARE STAPHYLOCOCCUS AUREUS CRITICAL RESULT CALLED TO, READ BACK BY AND VERIFIED WITH: D,FRANKLIN RN _0  09/12/20 EB NO ANAEROBES ISOLATED; CULTURE IN PROGRESS FOR 5 DAYS   . Report Status 09/11/2020 PENDING   Incomplete  . Organism ID, Bacteria 09/11/2020 STAPHYLOCOCCUS AUREUS   Final  . WBC 09/12/2020 7.5  4.0 - 10.5 K/uL Final  . RBC 09/12/2020 3.63* 3.87 - 5.11 MIL/uL Final  . Hemoglobin 09/12/2020 10.0* 12.0 - 15.0 g/dL Final  . HCT 09/12/2020 31.8* 36.0 - 46.0 % Final  . MCV 09/12/2020 87.6  80.0 - 100.0 fL Final  . MCH 09/12/2020 27.5  26.0 - 34.0 pg Final  . MCHC 09/12/2020 31.4  30.0 - 36.0 g/dL Final  . RDW 09/12/2020 14.0  11.5 - 15.5 % Final  . Platelets 09/12/2020 329  150 - 400 K/uL Final  . nRBC 09/12/2020 0.0  0.0 - 0.2 % Final   Performed at [facility], [place] [address], [place], [place] [address]  . Sodium 09/12/2020 136  135 - 145 mmol/L Final  . Potassium 09/12/2020 4.1  3.5 - 5.1 mmol/L Final  . Chloride 09/12/2020 101  98 - 111 mmol/L Final  . CO2 09/12/2020 24  22 - 32 mmol/L Final  . Glucose, Bld 09/12/2020 109* 70 - 99 mg/dL Final   Glucose reference range applies only to samples taken after fasting for at least 8 hours.  . BUN 09/12/2020 8  6 - 20 mg/dL Final  . Creatinine, Ser 09/12/2020 0.56  0.44 - 1.00 mg/dL Final  . Calcium 09/12/2020 9.0  8.9 - 10.3 mg/dL Final  . GFR, Estimated 09/12/2020 >60  >60 mL/min Final   Comment: (NOTE) Calculated using the CKD-EPI Creatinine Equation (2021)   . Anion gap 09/12/2020 11  5 - 15 Final   Performed at [facility], [place] [address], [place], [place] [address]  . CRP 09/12/2020 7.2* <1.0 mg/dL Final   Performed at [place] [address], [place], [place] [address]  . Sed Rate 09/12/2020 110* 0 - 22 mm/hr Final   Performed at [facility], [place] [address], [place], [place] [address]  . WBC 09/13/2020 8.9  4.0 - 10.5 K/uL Final  . RBC 09/13/2020 3.82* 3.87 - 5.11 MIL/uL Final  . Hemoglobin 09/13/2020 10.4* 12.0 - 15.0 g/dL Final  .  HCT 09/13/2020 33.7* 36.0 - 46.0 % Final  . MCV 09/13/2020 88.2  80.0 - 100.0 fL Final  . MCH 09/13/2020 27.2  26.0 - 34.0 pg Final  . MCHC 09/13/2020 30.9  30.0 - 36.0 g/dL Final  . RDW 09/13/2020 14.3  11.5 - 15.5 % Final  . Platelets 09/13/2020 320  150 - 400 K/uL Final  . nRBC 09/13/2020 0.0  0.0 - 0.2 % Final   Performed at [facility], [place] [address] [place], [place] [address]  . Sodium 09/13/2020 138  135 - 145 mmol/L Final  . Potassium 09/13/2020 3.6  3.5 - 5.1 mmol/L Final  . Chloride 09/13/2020 99  98 - 111 mmol/L Final  . CO2 09/13/2020 25  22 - 32 mmol/L Final  . Glucose, Bld 09/13/2020 108* 70 - 99 mg/dL Final   Glucose reference range applies only to samples taken after fasting for at least 8 hours.  . BUN 09/13/2020 12  6 - 20 mg/dL Final  . Creatinine, Ser 09/13/2020 0.63  0.44 - 1.00 mg/dL Final  . Calcium 09/13/2020 8.7* 8.9 - 10.3 mg/dL Final  . GFR, Estimated 09/13/2020 >60  >60 mL/min Final   Comment: (NOTE) Calculated using the CKD-EPI Creatinine Equation (2021)   . Anion gap 09/13/2020 14  5 - 15 Final   Performed at [facility], [place] [address], [place], [place] [address]  Hospital Outpatient Visit on 09/09/2020  Component Date Value Ref Range Status  . SARS Coronavirus 2 09/09/2020 POSITIVE* NEGATIVE Final   Comment: (NOTE) SARS-CoV-2 target nucleic acids are DETECTED.  The SARS-CoV-2 RNA is generally detectable in upper and lower respiratory specimens during the acute phase of infection. Positive results are indicative of the presence of SARS-CoV-2 RNA. Clinical correlation with patient history and other diagnostic information is  necessary to determine patient infection status. Positive results  do not rule out bacterial infection or co-infection with other viruses.  The expected result is Negative.  Fact Sheet for Patients: SugarRoll.be  Fact Sheet for Healthcare Providers: https://www.woods-mathews.com/  This test is not yet approved or cleared by the [place] FDA and  has been authorized for detection and/or diagnosis of SARS-CoV-2 by FDA under an Emergency Use Authorization (EUA). This EUA will remain  in effect (meaning this test can be used) for the duration of the COVID-19 declaration under Section 564(b)(1) of the Act, 21 U.                          S.C. section 360bbb-3(b)(1), unless the authorization is terminated or revoked sooner.   Performed at [facility], [place] [address], [place], [place] [address]   Hospital Outpatient Visit on 09/01/2020  Component Date Value Ref Range Status  . MICRO NUMBER: 09/01/2020 [id number]   Final  . SPECIMEN QUALITY: 09/01/2020 Adequate   Final  . Source: 09/01/2020 R HIP   Final  . STATUS: 09/01/2020 FINAL   Final  . Lonell Grandchild STAIN: 09/01/2020 Few Polymorphonuclear leukocytes No epithelial cells seen No organisms seen   Final  . ANA RESULT: 09/01/2020 No anaerobes isolated.   Final  . MICRO NUMBER: 09/01/2020 [id number]   Final  . SPECIMEN QUALITY: 09/01/2020 Adequate   Final  . SOURCE: 09/01/2020 R HIP   Final  . STATUS: 09/01/2020 FINAL   Final  . AER ISOLATE 1: 09/01/2020 Staphylococcus aureus*  Final   Light growth of Staphylococcus aureus This isolate demonstrates inducible clindamycin  resistance.  . Fluid type 09/01/2020 R HIP   Final  . Color 09/01/2020 COLORLESS   Final   Comment: Reference Range Not established   . Appearance 09/01/2020 HAZY   Final   Comment: Reference Range Not established   . Total Nucleated Cell Ct 09/01/2020 3,047  cells/uL Final   Comment: Reference Range Not established   . Neutrophils, % 09/01/2020 88  % Final   Comment: Reference  Range Not established   . Lymphocytes, % 09/01/2020 4  % Final   Comment: Reference Range Not established   . Monocyte/Macrophage % 09/01/2020 8  % Final   Comment: Reference Range Not established   . Eosinophils, % 09/01/2020 0  % Final   Comment: Reference Range Not established   . Basophils, % 09/01/2020 0  % Final   Comment: Reference Range Not established   . Mesothelial, % 09/01/2020 0  % Final   Comment: Reference Range Not established   . Source 09/01/2020 *R HIP   Final  . CRYSTALS, FLUID 09/01/2020   NONE SEEN /HPF Final   No crystals found     X-Rays:DG FLUORO GUIDED NEEDLE PLC ASPIRATION/INJECTION LOC  Result Date: 09/01/2020 CLINICAL DATA:  Right hip pain. Previous trauma. Joint edema and effusion. FLUOROSCOPY TIME:  Radiation Exposure Index (as provided by the fluoroscopic device): 23.37 uGy*m2 PROCEDURE: Following informed, written consent, the patient was positioned supine and the right hip was located under fluoroscopy. The skin was prepped and draped in the usual sterile fashion. The superficial soft tissues were anesthetized with 1% lidocaine. A 20 gauge needle was advanced into the joint. Placement was confirmed with 2 mL Isovue M 200. I was only able to aspirate 1 mL of joint fluid. I therefore injected 10 mL of sterile saline. I was able to aspirate back 5 cc of somewhat cloudy fluid. A mix this with the other 1 cc of joint fluid. 6 cc of fluid was sent for laboratory analysis as requested. IMPRESSION: Technically successful right hip aspiration. Electronically Signed   By: San Morelle M.D.   On: 09/01/2020 15:29   [place] EKG SITE RITE  Result Date: 09/12/2020 If Site Rite image not attached, placement could not be confirmed due to current cardiac rhythm.  [place] EKG SITE RITE  Result Date: 09/12/2020 If Site Rite image not attached, placement could not be confirmed due to current cardiac rhythm.   EKG:No orders found for this or any previous visit.    Hospital Course: Vanda Waskey is a 35 y.o. who was admitted to [facility]. They were brought to the operating room on 09/11/2020 and underwent Procedure(s): Right hip arthrotomy, irrigation and debridement-anterior approach.  Patient tolerated the procedure well and was later transferred to the recovery room and then to the orthopaedic floor for postoperative care. They were given PO and IV analgesics for pain control following their surgery. They were given 24 hours of postoperative antibiotics of  Anti-infectives (From admission, onward)   Start     Dose/Rate Route Frequency Ordered Stop   09/13/20 0000  ceFAZolin (ANCEF) IVPB        2 g Intravenous Every 8 hours 09/13/20 0802 10/10/20 2359   09/13/20 0000  ceFAZolin (ANCEF) 2-4 GM/100ML-% IVPB  Status:  Discontinued        2 g Intravenous Every 8 hours 09/13/20 0802 09/13/20    09/12/20 1000  ceFAZolin (ANCEF) IVPB 2g/100 mL premix  Status:  Discontinued  2 g 200 mL/hr over 30 Minutes Intravenous Every 8 hours 09/12/20 0900 09/13/20 2259   09/11/20 1930  ceFAZolin (ANCEF) IVPB 2g/100 mL premix        2 g 200 mL/hr over 30 Minutes Intravenous Every 6 hours 09/11/20 1518 09/12/20 0210   09/11/20 1015  ceFAZolin (ANCEF) IVPB 2g/100 mL premix        2 g 200 mL/hr over 30 Minutes Intravenous On call to O.R. 09/11/20 1004 09/11/20 1358     and started on DVT prophylaxis in the form of Aspirin.   PT and OT were ordered for total joint protocol. Discharge planning consulted to help with postop disposition and equipment needs. Worked with therapy on POD #1. Infectious disease was consulted for antibiotic management. Recommendations were as follows: Cefazolin via PICC line x 6 weeks. Pt was seen during rounds on day two and was ready to go home. Hemovac drain was pulled without difficulty. PICC line was placed that AM and patient was doing well with PT. HHRN was arranged and patient was discharged to home in stable  condition.   Diet: Regular diet Activity: WBAT Follow-up: in 2 weeks Disposition: Home Discharged Condition: stable   Discharge Instructions    [facility] pharmacist to adjust dose for Vancomycin, Aminoglycosides and other anti-infective therapies as requested by physician.   Complete by: As directed    [facility] to provide Cath Flo 18m   Complete by: As directed    Administer for PICC line occlusion and as ordered by physician for other access device issues.   Anaphylaxis Kit: Provided to treat any anaphylactic reaction to the medication being provided to the patient if First Dose or when requested by physician   Complete by: As directed    Epinephrine 165mml vial / amp: Administer 0.55m80m0.55ml51mubcutaneously once for moderate to severe anaphylaxis, nurse to call physician and pharmacy when reaction occurs and call 911 if needed for immediate care   Diphenhydramine 50mg44mIV vial: Administer 25-50mg 555mM PRN for first dose reaction, rash, itching, mild reaction, nurse to call physician and pharmacy when reaction occurs   Sodium Chloride 0.9% NS 500ml I655mdminister if needed for hypovolemic blood pressure drop or as ordered by physician after call to physician with anaphylactic reaction   Call MD / Call 911   Complete by: As directed    If you experience chest pain or shortness of breath, CALL 911 and be transported to the hospital emergency room.  If you develope a fever above 101 F, pus (white drainage) or increased drainage or redness at the wound, or calf pain, call your surgeon's office.   Change dressing on IV access line weekly and PRN   Complete by: As directed    Constipation Prevention   Complete by: As directed    Drink plenty of fluids.  Prune juice may be helpful.  You may use a stool softener, such as Colace (over the counter) 100 mg twice a day.  Use MiraLax (over the counter) for constipation as needed.   Diet - low sodium heart healthy    Complete by: As directed    Flush IV access with Sodium Chloride 0.9% and Heparin 10 units/ml or 100 units/ml   Complete by: As directed    Home infusion instructions - [facility]   Complete by: As directed    Instructions: Flush IV access with Sodium Chloride 0.9% and Heparin 10units/ml or 100units/ml   Change dressing on IV access  line: Weekly and PRN   Instructions Cath Flo 68m: Administer for PICC Line occlusion and as ordered by physician for other access device   [facility] pharmacist to adjust dose for: Vancomycin, Aminoglycosides and other anti-infective therapies as requested by physician   Increase activity slowly as tolerated   Complete by: As directed    Method of administration may be changed at the discretion of home infusion pharmacist based upon assessment of the patient and/or caregiver's ability to self-administer the medication ordered   Complete by: As directed      Allergies as of 09/13/2020      Reactions   Banana Itching   Pt reports itching in mouth and throat      Medication List    STOP taking these medications   meloxicam 7.5 MG tablet Commonly known as: MOBIC     TAKE these medications   ALPRAZolam 0.25 MG tablet Commonly known as: XANAX Take 0.25 mg by mouth at bedtime as needed for anxiety.   aspirin EC 81 MG tablet Take 1 tablet (81 mg total) by mouth in the morning and at bedtime for 21 days. Swallow whole.   ceFAZolin  IVPB Commonly known as: ANCEF Inject 2 g into the vein every 8 (eight) hours for 27 days. Indication:  Right hip septic arthritis  First Dose: Yes Last Day of Therapy:  10/09/20 Labs - Once weekly:  CBC/D and BMP, Labs - Every other week:  ESR and CRP Method of administration: IV Push Method of administration may be changed at the discretion of home infusion pharmacist based upon assessment of the patient and/or caregiver's ability to self-administer the medication ordered.   HYDROcodone-acetaminophen  10-325 MG tablet Commonly known as: NORCO Take 1 tablet by mouth every 6 (six) hours as needed for severe pain or moderate pain.   methocarbamol 500 MG tablet Commonly known as: ROBAXIN Take 1 tablet (500 mg total) by mouth every 6 (six) hours as needed for muscle spasms. What changed: when to take this   sertraline 100 MG tablet Commonly known as: ZOLOFT Take 100 mg by mouth daily.            Discharge Care Instructions  (From admission, onward)         Start     Ordered   09/13/20 0000  Change dressing on IV access line weekly and PRN  (Home infusion instructions - [facility] )        09/13/20 0802          Follow-up Information    AGaynelle Arabian MD. Schedule an appointment as soon as possible for a visit on 09/26/2020.   Specialty: Orthopedic Surgery Contact information: [phone]              Signed: KTheresa Duty PA-C Orthopedic Surgery 09/14/2020, 7:27 AM

## 2020-09-15 DIAGNOSIS — A327 Listerial sepsis: Secondary | ICD-10-CM | POA: Diagnosis not present

## 2020-09-15 DIAGNOSIS — M009 Pyogenic arthritis, unspecified: Secondary | ICD-10-CM | POA: Diagnosis not present

## 2020-09-16 LAB — AEROBIC/ANAEROBIC CULTURE W GRAM STAIN (SURGICAL/DEEP WOUND)

## 2020-09-21 DIAGNOSIS — M009 Pyogenic arthritis, unspecified: Secondary | ICD-10-CM | POA: Diagnosis not present

## 2020-09-21 DIAGNOSIS — A327 Listerial sepsis: Secondary | ICD-10-CM | POA: Diagnosis not present

## 2020-09-22 ENCOUNTER — Ambulatory Visit (INDEPENDENT_AMBULATORY_CARE_PROVIDER_SITE_OTHER): Payer: BC Managed Care – PPO | Admitting: Infectious Diseases

## 2020-09-22 ENCOUNTER — Encounter: Payer: Self-pay | Admitting: Infectious Diseases

## 2020-09-22 ENCOUNTER — Telehealth: Payer: Self-pay

## 2020-09-22 ENCOUNTER — Other Ambulatory Visit: Payer: Self-pay

## 2020-09-22 VITALS — BP 135/84 | HR 85 | Temp 98.3°F | Wt 176.0 lb

## 2020-09-22 DIAGNOSIS — M009 Pyogenic arthritis, unspecified: Secondary | ICD-10-CM | POA: Diagnosis not present

## 2020-09-22 DIAGNOSIS — M00051 Staphylococcal arthritis, right hip: Secondary | ICD-10-CM

## 2020-09-22 DIAGNOSIS — Z5181 Encounter for therapeutic drug level monitoring: Secondary | ICD-10-CM | POA: Diagnosis not present

## 2020-09-22 DIAGNOSIS — A327 Listerial sepsis: Secondary | ICD-10-CM | POA: Diagnosis not present

## 2020-09-22 NOTE — Telephone Encounter (Signed)
I spoke to [facility] with [facility] and gave verbal orders to continue IV antibiotics until 10/19/20, but not to pull picc per Dr. Elinor Parkinson.  Mary verbalized understanding and read orders back to me.  Patient has an office visit with Dr. Elinor Parkinson on 10/20/20 and Dr. Elinor Parkinson will decide if patient needs to continue IV antibiotics.  Yasmene Salomone T Pricilla Loveless

## 2020-09-22 NOTE — Progress Notes (Signed)
[facility] Infectious Diseases                                                             [place], [place], [place], [address]                                                                  Phn. [phone]; Fax: [phone]                                                                             Date: 09/22/20  Reason for Referral: HFU for septic arthritis   Assessment Right Hip Septic Arthritis ( native joint)- MSSA Pertinent labs  09/15/20 WBC 6.9, hb 10.7, platelets 370,cr 0.56 ESR 106, CRP 5.3   Plan Continue cefazolin as is. End of 4 weeks is 2/21. However, will continue iv antibiotics until my next clinic appointment in 3/4 tentatively Will plan to DC cefazolin around end of 4 weeks time if trend of inflammatory markers are reassuring  FU in 3-4 weeks   All questions and concerns were discussed and addressed. Patient verbalized understanding of the plan. ____________________________________________________________________________________________________________________  HPI: Tiffany Leonard is a 35 year old female with no significant PMH who is here for follow up of Right Hip septic arthritis. She was recently directly admitted in the hospital on 1/21 for I and D of rt septic hip joint. She was having rt hip pain since October and was seen by [facility]  and was given a cortisone shot, 2 days after she needed to be seen in the ED due to severe pain. She later started following up with Dr Maureen Ralphs on 08/25/2020. MRI rt hip showed septic arthritis with a moderate effusion, a suspected abscess in the superior acetabulum, and periarticular soft tissue edema and inflammation per Ortho notes. A joint aspirate was performed  on 08/31/2020 which was positive for MSSA and hence was admitted for I and D. Patient underwent Rt hip arthrotomy, irrigation and debridement on 1/24. OR cultures  with MSSA.   Patient is here for follow up with her mother. She is grtting IV antibiotics from the PICC line. Denied any nausea, vomiting, abdominal pain, diarrhea and rashes. Denies any issues with PICC line. Rt hip pain is better. She just feels some soreness in her rt hip. She has a follow up coming with orthopedic soon. She says she might need a hip replacement by her ? surgeon  ROS: Constitutional: Negative for fever, chills, activity change, appetite change, fatigue and unexpected weight change.  HENT: Negative for congestion, sore throat, rhinorrhea, sneezing, trouble swallowing and sinus pressure.  Eyes: Negative for photophobia and visual disturbance.  Respiratory: Negative for cough, chest tightness, shortness of breath, wheezing and stridor.  Cardiovascular: Negative for chest  pain, palpitations and leg swelling.  Gastrointestinal: Negative for nausea, vomiting, abdominal pain, diarrhea, constipation, blood in stool, abdominal distention and anal bleeding.  Genitourinary: Negative for dysuria, hematuria, flank pain and difficulty urinating.  Musculoskeletal: Negative for myalgias, back pain. RT HIP SORENESS+  Skin: Negative for color change, pallor, rash and wound.  Neurological: Negative for dizziness, tremors, weakness and light-headedness.  Hematological: Negative for adenopathy. Does not bruise/bleed easily.  Psychiatric/Behavioral: Negative for behavioral problems, confusion, sleep disturbance, dysphoric mood, decreased concentration and agitation.   Past Medical History:  Diagnosis Date  . Anxiety    Past Surgical History:  Procedure Laterality Date  . broken pelvis  2014  . INCISION AND DRAINAGE HIP Right 09/11/2020   Procedure: Right hip arthrotomy, irrigation and debridement-anterior approach;  Surgeon: Gaynelle Arabian, MD;  Location: [facility];  Service: Orthopedics;  Laterality: Right;  14mn  . SHOULDER SURGERY  2014   Current Outpatient Medications on File Prior to  Visit  Medication Sig Dispense Refill  . ALPRAZolam (XANAX) 0.25 MG tablet Take 0.25 mg by mouth at bedtime as needed for anxiety.    .Marland Kitchenaspirin EC 81 MG tablet Take 1 tablet (81 mg total) by mouth in the morning and at bedtime for 21 days. Swallow whole. 42 tablet 0  . ceFAZolin (ANCEF) IVPB Inject 2 g into the vein every 8 (eight) hours for 27 days. Indication:  Right hip septic arthritis  First Dose: Yes Last Day of Therapy:  10/09/20 Labs - Once weekly:  CBC/D and BMP, Labs - Every other week:  ESR and CRP Method of administration: IV Push Method of administration may be changed at the discretion of home infusion pharmacist based upon assessment of the patient and/or caregiver's ability to self-administer the medication ordered. 81 Units 0  . HYDROcodone-acetaminophen (NORCO) 10-325 MG tablet Take 1 tablet by mouth every 6 (six) hours as needed for severe pain or moderate pain.    . methocarbamol (ROBAXIN) 500 MG tablet Take 1 tablet (500 mg total) by mouth every 6 (six) hours as needed for muscle spasms. 40 tablet 0  . sertraline (ZOLOFT) 100 MG tablet Take 100 mg by mouth daily.     No current facility-administered medications on file prior to visit.   Allergies  Allergen Reactions  . Banana Itching    Pt reports itching in mouth and throat   Social History   Socioeconomic History  . Marital status: Married    Spouse name: Not on file  . Number of children: Not on file  . Years of education: Not on file  . Highest education level: Not on file  Occupational History  . Not on file  Tobacco Use  . Smoking status: Never Smoker  . Smokeless tobacco: Never Used  Vaping Use  . Vaping Use: Never used  Substance and Sexual Activity  . Alcohol use: Not Currently  . Drug use: Never  . Sexual activity: Yes    Birth control/protection: I.U.D.  Other Topics Concern  . Not on file  Social History Narrative  . Not on file   Social Determinants of Health   Financial Resource  Strain: Not on file  Food Insecurity: Not on file  Transportation Needs: Not on file  Physical Activity: Not on file  Stress: Not on file  Social Connections: Not on file  Intimate Partner Violence: Not on file    Vitals BP 135/84   Pulse 85   Temp 98.3 F (36.8 C) (Oral)   Wt 176  lb (79.8 kg)   BMI 25.25 kg/m    Examination  General - not in acute distress, comfortably sitting in chair HEENT - PEERLA, no pallor and no icterus Chest - b/l clear air entry, no additional sounds CVS- Normal s1s2, RRR Abdomen - Soft, Non tender , non distended Ext- no pedal edema Rt hip - has a bandage, minimal swelling and tenderness+ ( did not open as patient told me that surgeon told her to keep the bandage on until seen in the clinic by Orthopedics) Neuro: grossly normal Back - WNL Psych : calm and cooperative  Recent labs CBC Latest Ref Rng & Units 09/13/2020 09/12/2020 09/11/2020  WBC 4.0 - 10.5 K/uL 8.9 7.5 8.2  Hemoglobin 12.0 - 15.0 g/dL 10.4(L) 10.0(L) 10.3(L)  Hematocrit 36.0 - 46.0 % 33.7(L) 31.8(L) 32.6(L)  Platelets 150 - 400 K/uL 320 329 351   CMP Latest Ref Rng & Units 09/13/2020 09/12/2020 09/11/2020  Glucose 70 - 99 mg/dL 108(H) 109(H) 97  BUN 6 - 20 mg/dL '12 8 13  ' Creatinine 0.44 - 1.00 mg/dL 0.63 0.56 0.67  Sodium 135 - 145 mmol/L 138 136 138  Potassium 3.5 - 5.1 mmol/L 3.6 4.1 3.7  Chloride 98 - 111 mmol/L 99 101 100  CO2 22 - 32 mmol/L '25 24 24  ' Calcium 8.9 - 10.3 mg/dL 8.7(L) 9.0 9.5  Total Protein 6.5 - 8.1 g/dL - - -  Total Bilirubin 0.3 - 1.2 mg/dL - - -  Alkaline Phos 38 - 126 U/L - - -  AST 15 - 41 U/L - - -  ALT 0 - 44 U/L - - -     Pertinent Microbiology Results for orders placed or performed during the hospital encounter of 09/11/20  Surgical pcr screen     Status: None   Collection Time: 09/11/20 10:05 AM   Specimen: Nasal Mucosa; Nasal Swab  Result Value Ref Range Status   MRSA, PCR NEGATIVE NEGATIVE Final   Staphylococcus aureus NEGATIVE NEGATIVE  Final    Comment: (NOTE) The Xpert SA Assay (FDA approved for NASAL specimens in patients 44 years of age and older), is one component of a comprehensive surveillance program. It is not intended to diagnose infection nor to guide or monitor treatment. Performed at [facility], [place] [address] [place], [place] [address]   Aerobic/Anaerobic Culture (surgical/deep wound)     Status: None   Collection Time: 09/11/20  1:25 PM   Specimen: Soft Tissue, Other  Result Value Ref Range Status   Specimen Description   Final    TISSUE RIGHT HIP Performed at [place] [facility] [place] [address], [place], [place] [address]    Special Requests   Final    NONE Performed at [facility], [place] [address], [place], [place] [address]    Gram Stain   Final    RARE WBC PRESENT,BOTH PMN AND MONONUCLEAR NO ORGANISMS SEEN    Culture   Final    RARE STAPHYLOCOCCUS AUREUS CRITICAL RESULT CALLED TO, READ BACK BY AND VERIFIED WITH: D,FRANKLIN RN '@[id number]'  09/12/20 EB NO ANAEROBES ISOLATED Performed at [facility], [place] [address], [place], [place] [address]    Report Status 09/16/2020 FINAL  Final   Organism ID, Bacteria STAPHYLOCOCCUS AUREUS  Final      Susceptibility   Staphylococcus aureus - MIC*    CIPROFLOXACIN <=0.5 SENSITIVE Sensitive     ERYTHROMYCIN RESISTANT Resistant     GENTAMICIN <=0.5 SENSITIVE Sensitive  OXACILLIN <=0.25 SENSITIVE Sensitive     TETRACYCLINE <=1 SENSITIVE Sensitive     VANCOMYCIN <=0.5 SENSITIVE Sensitive     TRIMETH/SULFA <=10 SENSITIVE Sensitive     CLINDAMYCIN RESISTANT Resistant     RIFAMPIN <=0.5 SENSITIVE Sensitive     Inducible Clindamycin POSITIVE Resistant     * RARE STAPHYLOCOCCUS AUREUS      Pertinent Imaging   All pertinent labs/Imagings/notes reviewed. All pertinent plain films and CT images have been personally visualized and interpreted; radiology reports have been reviewed. Decision making  incorporated into the Impression / Recommendations.  I spent greater than 25 minutes with the patient including  review of prior medical records with greater than 50% of time in face to face counsel of the patient.    Electronically signed by:  Rosiland Oz, MD Infectious Disease Physician [facility] for Infectious Disease [address] [place], [place] [address] Phone: [phone]  Fax: [phone]

## 2020-09-23 DIAGNOSIS — Z5181 Encounter for therapeutic drug level monitoring: Secondary | ICD-10-CM | POA: Insufficient documentation

## 2020-09-23 HISTORY — DX: Encounter for therapeutic drug level monitoring: Z51.81

## 2020-09-28 DIAGNOSIS — M009 Pyogenic arthritis, unspecified: Secondary | ICD-10-CM | POA: Diagnosis not present

## 2020-09-28 DIAGNOSIS — A327 Listerial sepsis: Secondary | ICD-10-CM | POA: Diagnosis not present

## 2020-09-30 DIAGNOSIS — A327 Listerial sepsis: Secondary | ICD-10-CM | POA: Diagnosis not present

## 2020-09-30 DIAGNOSIS — M009 Pyogenic arthritis, unspecified: Secondary | ICD-10-CM | POA: Diagnosis not present

## 2020-09-30 DIAGNOSIS — M00251 Other streptococcal arthritis, right hip: Secondary | ICD-10-CM | POA: Diagnosis not present

## 2020-10-05 DIAGNOSIS — A327 Listerial sepsis: Secondary | ICD-10-CM | POA: Diagnosis not present

## 2020-10-05 DIAGNOSIS — M009 Pyogenic arthritis, unspecified: Secondary | ICD-10-CM | POA: Diagnosis not present

## 2020-10-12 DIAGNOSIS — A327 Listerial sepsis: Secondary | ICD-10-CM | POA: Diagnosis not present

## 2020-10-12 DIAGNOSIS — M009 Pyogenic arthritis, unspecified: Secondary | ICD-10-CM | POA: Diagnosis not present

## 2020-10-13 ENCOUNTER — Telehealth: Payer: Self-pay

## 2020-10-13 NOTE — Telephone Encounter (Signed)
Received call from patient, she states home health has not been out recently so she wasn't sure if she was supposed to be continuing with IV antibiotics. She still has plenty of medication. RN relayed per Dr. Leafy Half last note (09/22/20) that she is to continue with IV antibiotics until her appointment with Dr. Elinor Parkinson on 10/20/20. Patient verbalized understanding and has no further questions.   She states she will call home health ton find out why they have not been coming as frequently.   Sandie Ano, RN

## 2020-10-15 DIAGNOSIS — M009 Pyogenic arthritis, unspecified: Secondary | ICD-10-CM | POA: Diagnosis not present

## 2020-10-15 DIAGNOSIS — A327 Listerial sepsis: Secondary | ICD-10-CM | POA: Diagnosis not present

## 2020-10-20 ENCOUNTER — Telehealth: Payer: Self-pay

## 2020-10-20 ENCOUNTER — Ambulatory Visit (INDEPENDENT_AMBULATORY_CARE_PROVIDER_SITE_OTHER): Payer: BC Managed Care – PPO | Admitting: Infectious Diseases

## 2020-10-20 ENCOUNTER — Encounter: Payer: Self-pay | Admitting: Infectious Diseases

## 2020-10-20 ENCOUNTER — Other Ambulatory Visit: Payer: Self-pay

## 2020-10-20 VITALS — BP 119/76 | HR 103 | Temp 98.3°F | Ht 70.0 in | Wt 174.0 lb

## 2020-10-20 DIAGNOSIS — Z5181 Encounter for therapeutic drug level monitoring: Secondary | ICD-10-CM

## 2020-10-20 DIAGNOSIS — M00051 Staphylococcal arthritis, right hip: Secondary | ICD-10-CM

## 2020-10-20 NOTE — Progress Notes (Signed)
Per verbal order from Dr. Elinor Parkinson, 39 cm PICC removed from right basilic, tip intact. No sutures present. RN confirmed length per chart. Dressing was clean and dry. Petroleum dressing applied. Patient advised no heavy lifting with this arm and to leave dressing in place for 24 hours and not to shower affected arm for 24 hours. Advised patient to call office or seek emergency medical care if dressing becomes soaked with blood, swelling, or sharp pain presents. Advised patient to seek emergent care if develops neurological symptoms, chest pain, or shortness of breath. Patient verbalized understanding and agreement. RN answered patient's questions. Patient tolerated procedure well and RN walked patient to check out. RN notified Debbie at [facility] of removal.    Sandie Ano, RN

## 2020-10-20 NOTE — Telephone Encounter (Signed)
[facility] called asking if we decided to PULL PICC on patient while she was in office today. Advised Aundra Millet is working on the orders and will contact [facility] regarding Pull Picc dates.

## 2020-10-20 NOTE — Progress Notes (Signed)
[name] for Infectious Diseases                                                             [place], [place], [place], [address]                                                                  Phn. [phone]; Fax: [phone]                                                                             Date: 10/20/20  Reason for Follow Up: septic arthritis   Assessment Right Hip Septic Arthritis ( native joint)- MSSA Medication monitoring  HH labs 10/02/20  WBC 4.5, hb 10.7, platelets 267,  ESR 40, CRP 12.2, Cr 0.55 10/16/20 WBC 5.6, hb 11.8, platelets 299, Cr 0.65, ESR 19, CRP <3  Plan Will DC cefazolin given clinical improvement and down-trending inflammatory markers  PICC line to be removed in the office today Fu with Ortho Fu with me as needed   All questions and concerns were discussed and addressed. Patient verbalized understanding of the plan.  ___________________________________________________________________________________________________________________  HPI: Tiffany File Pattersonis a 35 year old female with no significant PMH who is here for follow up of Right Hip septic arthritis. She was recently directly admitted in the hospital on 1/21 for I and D of rt septic hip joint. She was having rt hip pain since October and was seen by [facility] and was given a cortisone shot, 2 days after she needed to be seen in the ED due to severe pain. She later started following up with Dr Maureen Ralphs on 08/25/2020.MRI rt hip showedseptic arthritis with a moderate effusion, a suspected abscess in the superior acetabulum, and periarticular soft tissue edema and inflammation per Ortho notes.Ajoint aspirate was performedon 08/31/2020 which was positive for MSSA and hence was admitted for I and D. Patient underwent Rt hip arthrotomy, irrigation and debridement on 1/24. OR cultures with MSSA.    Patient is here for follow up with her mother. She is grtting IV antibiotics from the PICC line. Denied any nausea, vomiting, abdominal pain, diarrhea and rashes. Denies any issues with PICC line. Rt hip pain is better. She just feels some soreness in her rt hip. She has a follow up coming with orthopedic soon. She says she might need a hip replacement by her ? Surgeon.  10/20/20 Here for follow up of RT hip septic arthritis. Denies any pain/tenderness/swelling at the rt hip. Surgical wound has nicely healed. Denies any N/d/d/rashes/abdominal pain. Denies any issues with the PICC line. Able to walk without any support although has some limp. She is following with Ortho next week.  ROS: Constitutional: Negative for fever, chills, activity change, appetite change, fatigue and unexpected  weight change.  HENT: Negative for congestion, sore throat, rhinorrhea, sneezing, trouble swallowing and sinus pressure.  Eyes: Negative for photophobia and visual disturbance.  Respiratory: Negative for cough, chest tightness, shortness of breath, wheezing and stridor.  Cardiovascular: Negative for chest pain, palpitations and leg swelling.  Gastrointestinal: Negative for nausea, vomiting, abdominal pain, diarrhea, constipation, blood in stool, abdominal distention and anal bleeding.  Genitourinary: Negative for dysuria, hematuria, flank pain and difficulty urinating.  Musculoskeletal: Negative for myalgias, back pain, joint swelling, arthralgias and gait problem.  Skin: Negative for color change, pallor, rash and wound.  Neurological: Negative for dizziness, tremors, weakness and light-headedness.  Hematological: Negative for adenopathy. Does not bruise/bleed easily.  Psychiatric/Behavioral: Negative for behavioral problems, confusion, sleep disturbance, dysphoric mood, decreased concentration and agitation.   Past Medical History:  Diagnosis Date  . Anxiety    Past Surgical History:  Procedure Laterality  Date  . broken pelvis  2014  . INCISION AND DRAINAGE HIP Right 09/11/2020   Procedure: Right hip arthrotomy, irrigation and debridement-anterior approach;  Surgeon: Tiffany Arabian, MD;  Location: [facility];  Service: Orthopedics;  Laterality: Right;  23mn  . SHOULDER SURGERY  2014   Current Outpatient Medications on File Prior to Visit  Medication Sig Dispense Refill  . sertraline (ZOLOFT) 100 MG tablet Take 100 mg by mouth daily.    .Marland KitchenALPRAZolam (XANAX) 0.25 MG tablet Take 0.25 mg by mouth at bedtime as needed for anxiety. (Patient not taking: Reported on 10/20/2020)    . HYDROcodone-acetaminophen (NORCO) 10-325 MG tablet Take 1 tablet by mouth every 6 (six) hours as needed for severe pain or moderate pain. (Patient not taking: Reported on 10/20/2020)    . methocarbamol (ROBAXIN) 500 MG tablet Take 1 tablet (500 mg total) by mouth every 6 (six) hours as needed for muscle spasms. (Patient not taking: Reported on 10/20/2020) 40 tablet 0   No current facility-administered medications on file prior to visit.   Allergies  Allergen Reactions  . Banana Itching    Pt reports itching in mouth and throat   Social History   Socioeconomic History  . Marital status: Married    Spouse name: Not on file  . Number of children: Not on file  . Years of education: Not on file  . Highest education level: Not on file  Occupational History  . Not on file  Tobacco Use  . Smoking status: Never Smoker  . Smokeless tobacco: Never Used  Vaping Use  . Vaping Use: Never used  Substance and Sexual Activity  . Alcohol use: Not Currently  . Drug use: Never  . Sexual activity: Yes    Birth control/protection: I.U.D.  Other Topics Concern  . Not on file  Social History Narrative  . Not on file   Social Determinants of Health   Financial Resource Strain: Not on file  Food Insecurity: Not on file  Transportation Needs: Not on file  Physical Activity: Not on file  Stress: Not on file  Social Connections: Not  on file  Intimate Partner Violence: Not on file     Vitals BP 119/76   Pulse (!) 103   Temp 98.3 F (36.8 C) (Oral)   Ht '5\' 10"'  (1.778 m)   Wt 174 lb (78.9 kg)   SpO2 100%   BMI 24.97 kg/m     Examination  General - not in acute distress, comfortably sitting in chair HEENT - PEERLA, no pallor and no icterus Chest - b/l clear air entry, no  additional sounds CVS- Normal s1s2, RRR Abdomen - Soft, Non tender , non distended Ext- no pedal edema Rt hip     Neuro: grossly normal Back - WNL Psych : calm and cooperative   Recent labs CBC Latest Ref Rng & Units 09/13/2020 09/12/2020 09/11/2020  WBC 4.0 - 10.5 K/uL 8.9 7.5 8.2  Hemoglobin 12.0 - 15.0 g/dL 10.4(L) 10.0(L) 10.3(L)  Hematocrit 36.0 - 46.0 % 33.7(L) 31.8(L) 32.6(L)  Platelets 150 - 400 K/uL 320 329 351   CMP Latest Ref Rng & Units 09/13/2020 09/12/2020 09/11/2020  Glucose 70 - 99 mg/dL 108(H) 109(H) 97  BUN 6 - 20 mg/dL '12 8 13  ' Creatinine 0.44 - 1.00 mg/dL 0.63 0.56 0.67  Sodium 135 - 145 mmol/L 138 136 138  Potassium 3.5 - 5.1 mmol/L 3.6 4.1 3.7  Chloride 98 - 111 mmol/L 99 101 100  CO2 22 - 32 mmol/L '25 24 24  ' Calcium 8.9 - 10.3 mg/dL 8.7(L) 9.0 9.5  Total Protein 6.5 - 8.1 g/dL - - -  Total Bilirubin 0.3 - 1.2 mg/dL - - -  Alkaline Phos 38 - 126 U/L - - -  AST 15 - 41 U/L - - -  ALT 0 - 44 U/L - - -     Pertinent Microbiology Results for orders placed or performed during the hospital encounter of 09/11/20  Surgical pcr screen     Status: None   Collection Time: 09/11/20 10:05 AM   Specimen: Nasal Mucosa; Nasal Swab  Result Value Ref Range Status   MRSA, PCR NEGATIVE NEGATIVE Final   Staphylococcus aureus NEGATIVE NEGATIVE Final    Comment: (NOTE) The Xpert SA Assay (FDA approved for NASAL specimens in patients 59 years of age and older), is one component of a comprehensive surveillance program. It is not intended to diagnose infection nor to guide or monitor treatment. Performed at [facility], [place] [address] [place], [place] [address]   Aerobic/Anaerobic Culture (surgical/deep wound)     Status: None   Collection Time: 09/11/20  1:25 PM   Specimen: Soft Tissue, Other  Result Value Ref Range Status   Specimen Description   Final    TISSUE RIGHT HIP Performed at [facility] [address], [place], [place] [address]    Special Requests   Final    NONE Performed at [facility], [place] [address], [place], [place] [address]    Gram Stain   Final    RARE WBC PRESENT,BOTH PMN AND MONONUCLEAR NO ORGANISMS SEEN    Culture   Final    RARE STAPHYLOCOCCUS AUREUS CRITICAL RESULT CALLED TO, READ BACK BY AND VERIFIED WITH: D,FRANKLIN RN '@[id number]'  09/12/20 EB NO ANAEROBES ISOLATED Performed at [place] [address], [place],  [address]    Report Status 09/16/2020 FINAL  Final   Organism ID, Bacteria STAPHYLOCOCCUS AUREUS  Final      Susceptibility   Staphylococcus aureus - MIC*    CIPROFLOXACIN <=0.5 SENSITIVE Sensitive     ERYTHROMYCIN RESISTANT Resistant     GENTAMICIN <=0.5 SENSITIVE Sensitive     OXACILLIN <=0.25 SENSITIVE Sensitive     TETRACYCLINE <=1 SENSITIVE Sensitive     VANCOMYCIN <=0.5 SENSITIVE Sensitive     TRIMETH/SULFA <=10 SENSITIVE Sensitive     CLINDAMYCIN RESISTANT Resistant     RIFAMPIN <=0.5 SENSITIVE Sensitive     Inducible Clindamycin POSITIVE Resistant     * RARE STAPHYLOCOCCUS AUREUS  Pertinent Imaging All pertinent labs/Imagings/notes reviewed. All pertinent plain films and CT images have been personally visualized and interpreted; radiology reports have been reviewed. Decision making incorporated into the Impression / Recommendations.  I spent 30 minutes with the patient including  review of prior medical records with greater than 50% of time in face to face counsel of the patient.    Electronically signed by:  Rosiland Oz, MD Infectious Disease  Physician [facility] for Infectious Disease [address] [place], [place] [address] Phone: [phone]  Fax: [phone]

## 2020-10-30 ENCOUNTER — Ambulatory Visit: Payer: BC Managed Care – PPO | Admitting: Neurology

## 2020-11-01 DIAGNOSIS — M25551 Pain in right hip: Secondary | ICD-10-CM | POA: Diagnosis not present

## 2020-12-27 DIAGNOSIS — M25551 Pain in right hip: Secondary | ICD-10-CM | POA: Diagnosis not present

## 2020-12-28 DIAGNOSIS — M25551 Pain in right hip: Secondary | ICD-10-CM | POA: Diagnosis not present

## 2020-12-29 ENCOUNTER — Encounter (HOSPITAL_COMMUNITY): Payer: Self-pay | Admitting: Orthopedic Surgery

## 2020-12-29 ENCOUNTER — Other Ambulatory Visit: Payer: Self-pay

## 2020-12-29 ENCOUNTER — Other Ambulatory Visit (HOSPITAL_COMMUNITY)
Admission: RE | Admit: 2020-12-29 | Discharge: 2020-12-29 | Disposition: A | Discharge status: Home / Self Care | Payer: BLUE CROSS/BLUE SHIELD | Source: Ambulatory Visit | Attending: Orthopedic Surgery | Admitting: Orthopedic Surgery

## 2020-12-29 DIAGNOSIS — Z471 Aftercare following joint replacement surgery: Secondary | ICD-10-CM | POA: Diagnosis not present

## 2020-12-29 DIAGNOSIS — Z01812 Encounter for preprocedural laboratory examination: Secondary | ICD-10-CM | POA: Insufficient documentation

## 2020-12-29 DIAGNOSIS — Z96641 Presence of right artificial hip joint: Secondary | ICD-10-CM | POA: Diagnosis not present

## 2020-12-29 DIAGNOSIS — Z20822 Contact with and (suspected) exposure to covid-19: Secondary | ICD-10-CM | POA: Diagnosis not present

## 2020-12-29 DIAGNOSIS — Z793 Long term (current) use of hormonal contraceptives: Secondary | ICD-10-CM | POA: Diagnosis not present

## 2020-12-29 DIAGNOSIS — M009 Pyogenic arthritis, unspecified: Secondary | ICD-10-CM | POA: Diagnosis not present

## 2020-12-29 DIAGNOSIS — M00051 Staphylococcal arthritis, right hip: Secondary | ICD-10-CM | POA: Diagnosis not present

## 2020-12-29 DIAGNOSIS — Z91018 Allergy to other foods: Secondary | ICD-10-CM | POA: Diagnosis not present

## 2020-12-29 DIAGNOSIS — M869 Osteomyelitis, unspecified: Secondary | ICD-10-CM | POA: Diagnosis not present

## 2020-12-29 DIAGNOSIS — A327 Listerial sepsis: Secondary | ICD-10-CM | POA: Diagnosis not present

## 2020-12-29 DIAGNOSIS — F419 Anxiety disorder, unspecified: Secondary | ICD-10-CM | POA: Diagnosis not present

## 2020-12-29 DIAGNOSIS — Z79899 Other long term (current) drug therapy: Secondary | ICD-10-CM | POA: Diagnosis not present

## 2020-12-29 NOTE — Progress Notes (Addendum)
COVID Vaccine Completed: No Date COVID Vaccine completed: Has received booster: COVID vaccine manufacturer: Pfizer    [employer]   Date of COVID positive in last 90 days: N/A  PCP - No PCP Cardiologist - N/A  Chest x-ray - N/A EKG - N/A Stress Test - N/A ECHO - N/A Cardiac Cath - N/A Pacemaker/ICD device last checked: Spinal Cord Stimulator:  Sleep Study - N/A CPAP -   Fasting Blood Sugar - N/A Checks Blood Sugar _____ times a day  Blood Thinner Instructions: N/A Aspirin Instructions: Last Dose:  Activity level:  Can go up a flight of stairs and perform activities of daily living without stopping and without symptoms of chest pain or shortness of breath.     Unable to exercise without symptoms of hip pain.    Anesthesia review:  N/A  Patient denies shortness of breath, fever, cough and chest pain at PAT appointment (completed over the phone)   Patient verbalized understanding of instructions that were given to them at the PAT appointment. Patient was also instructed that they will need to review over the PAT instructions again at home before surgery.

## 2020-12-30 LAB — SARS CORONAVIRUS 2 (TAT 6-24 HRS): SARS Coronavirus 2: NEGATIVE

## 2021-01-01 ENCOUNTER — Encounter (HOSPITAL_COMMUNITY): Payer: Self-pay | Admitting: Orthopedic Surgery

## 2021-01-01 ENCOUNTER — Ambulatory Visit (HOSPITAL_COMMUNITY): Payer: BLUE CROSS/BLUE SHIELD | Admitting: Anesthesiology

## 2021-01-01 ENCOUNTER — Encounter (HOSPITAL_COMMUNITY)
Admission: RE | Disposition: A | Discharge status: Home / Self Care | Payer: Self-pay | Source: Home / Self Care | Attending: Orthopedic Surgery

## 2021-01-01 ENCOUNTER — Ambulatory Visit (HOSPITAL_COMMUNITY): Payer: BLUE CROSS/BLUE SHIELD

## 2021-01-01 ENCOUNTER — Other Ambulatory Visit (HOSPITAL_COMMUNITY): Payer: Self-pay

## 2021-01-01 ENCOUNTER — Inpatient Hospital Stay (HOSPITAL_COMMUNITY): Payer: BLUE CROSS/BLUE SHIELD

## 2021-01-01 ENCOUNTER — Inpatient Hospital Stay (HOSPITAL_COMMUNITY)
Admission: RE | Admit: 2021-01-01 | Discharge: 2021-01-03 | DRG: 470 | Disposition: A | Discharge status: Home Health | Payer: BLUE CROSS/BLUE SHIELD | Attending: Orthopedic Surgery | Admitting: Orthopedic Surgery

## 2021-01-01 DIAGNOSIS — S72041A Displaced fracture of base of neck of right femur, initial encounter for closed fracture: Secondary | ICD-10-CM

## 2021-01-01 DIAGNOSIS — F419 Anxiety disorder, unspecified: Secondary | ICD-10-CM | POA: Diagnosis present

## 2021-01-01 DIAGNOSIS — Z793 Long term (current) use of hormonal contraceptives: Secondary | ICD-10-CM | POA: Diagnosis not present

## 2021-01-01 DIAGNOSIS — M869 Osteomyelitis, unspecified: Secondary | ICD-10-CM | POA: Diagnosis present

## 2021-01-01 DIAGNOSIS — Z20822 Contact with and (suspected) exposure to covid-19: Secondary | ICD-10-CM | POA: Diagnosis present

## 2021-01-01 DIAGNOSIS — Z91018 Allergy to other foods: Secondary | ICD-10-CM

## 2021-01-01 DIAGNOSIS — M009 Pyogenic arthritis, unspecified: Secondary | ICD-10-CM | POA: Diagnosis not present

## 2021-01-01 DIAGNOSIS — Z79899 Other long term (current) drug therapy: Secondary | ICD-10-CM | POA: Diagnosis not present

## 2021-01-01 DIAGNOSIS — Z96649 Presence of unspecified artificial hip joint: Secondary | ICD-10-CM

## 2021-01-01 DIAGNOSIS — M00051 Staphylococcal arthritis, right hip: Secondary | ICD-10-CM

## 2021-01-01 HISTORY — PX: EXCISIONAL TOTAL HIP ARTHROPLASTY WITH ANTIBIOTIC SPACERS: SHX5826

## 2021-01-01 HISTORY — DX: Unspecified osteoarthritis, unspecified site: M19.90

## 2021-01-01 LAB — PROTIME-INR
INR: 1.1 (ref 0.8–1.2)
Prothrombin Time: 14.4 seconds (ref 11.4–15.2)

## 2021-01-01 LAB — CBC
HCT: 39.7 % (ref 36.0–46.0)
Hemoglobin: 12.8 g/dL (ref 12.0–15.0)
MCH: 27.1 pg (ref 26.0–34.0)
MCHC: 32.2 g/dL (ref 30.0–36.0)
MCV: 84.1 fL (ref 80.0–100.0)
Platelets: 360 10*3/uL (ref 150–400)
RBC: 4.72 MIL/uL (ref 3.87–5.11)
RDW: 14.6 % (ref 11.5–15.5)
WBC: 8 10*3/uL (ref 4.0–10.5)
nRBC: 0 % (ref 0.0–0.2)

## 2021-01-01 LAB — SURGICAL PCR SCREEN
MRSA, PCR: NEGATIVE
Staphylococcus aureus: NEGATIVE

## 2021-01-01 LAB — TYPE AND SCREEN
ABO/RH(D): AB NEG
Antibody Screen: NEGATIVE

## 2021-01-01 LAB — PREGNANCY, URINE: Preg Test, Ur: NEGATIVE

## 2021-01-01 LAB — COMPREHENSIVE METABOLIC PANEL
ALT: 32 U/L (ref 0–44)
AST: 20 U/L (ref 15–41)
Albumin: 3.6 g/dL (ref 3.5–5.0)
Alkaline Phosphatase: 83 U/L (ref 38–126)
Anion gap: 9 (ref 5–15)
BUN: 11 mg/dL (ref 6–20)
CO2: 28 mmol/L (ref 22–32)
Calcium: 9.5 mg/dL (ref 8.9–10.3)
Chloride: 101 mmol/L (ref 98–111)
Creatinine, Ser: 0.64 mg/dL (ref 0.44–1.00)
GFR, Estimated: >60 mL/min (ref >60)
Glucose, Bld: 84 mg/dL (ref 70–99)
Potassium: 3.9 mmol/L (ref 3.5–5.1)
Sodium: 138 mmol/L (ref 135–145)
Total Bilirubin: <0.1 mg/dL — ABNORMAL LOW (ref 0.3–1.2)
Total Protein: 7.6 g/dL (ref 6.5–8.1)

## 2021-01-01 SURGERY — EXCISIONAL TOTAL HIP ARTHROPLASTY WITH ANTIBIOTIC SPACERS
Anesthesia: General | Site: Hip | Laterality: Right

## 2021-01-01 MED ORDER — LIDOCAINE 2% (20 MG/ML) 5 ML SYRINGE
INTRAMUSCULAR | Status: AC
  Filled 2021-01-01: qty 5

## 2021-01-01 MED ORDER — MIDAZOLAM HCL 2 MG/2ML IJ SOLN
INTRAMUSCULAR | Status: AC
  Filled 2021-01-01: qty 2

## 2021-01-01 MED ORDER — FENTANYL CITRATE (PF) 250 MCG/5ML IJ SOLN
INTRAMUSCULAR | Status: DC | PRN
  Administered 2021-01-01 (×2): 50 ug via INTRAVENOUS
  Administered 2021-01-01: 100 ug via INTRAVENOUS
  Administered 2021-01-01: 50 ug via INTRAVENOUS
  Administered 2021-01-01 (×2): 100 ug via INTRAVENOUS

## 2021-01-01 MED ORDER — MIDAZOLAM HCL 2 MG/2ML IJ SOLN
INTRAMUSCULAR | Status: DC | PRN
  Administered 2021-01-01: 2 mg via INTRAVENOUS

## 2021-01-01 MED ORDER — OXYCODONE HCL 5 MG PO TABS: 5.0000 mg | ORAL_TABLET | Freq: Once | ORAL | Status: DC | PRN

## 2021-01-01 MED ORDER — HYDROMORPHONE HCL 1 MG/ML IJ SOLN
INTRAMUSCULAR | Status: AC
  Filled 2021-01-01: qty 1

## 2021-01-01 MED ORDER — ACETAMINOPHEN 325 MG PO TABS: 325.0000 mg | ORAL_TABLET | Freq: Four times a day (QID) | ORAL | Status: DC | PRN

## 2021-01-01 MED ORDER — MEPERIDINE HCL 50 MG/ML IJ SOLN: 6.2500 mg | INTRAMUSCULAR | Status: DC | PRN

## 2021-01-01 MED ORDER — SUGAMMADEX SODIUM 200 MG/2ML IV SOLN
INTRAVENOUS | Status: DC | PRN
  Administered 2021-01-01: 200 mg via INTRAVENOUS

## 2021-01-01 MED ORDER — METOCLOPRAMIDE HCL 5 MG/ML IJ SOLN: 5.0000 mg | Freq: Three times a day (TID) | INTRAMUSCULAR | Status: DC | PRN

## 2021-01-01 MED ORDER — MORPHINE SULFATE (PF) 2 MG/ML IV SOLN: 0.5000 mg | INTRAVENOUS | Status: DC | PRN

## 2021-01-01 MED ORDER — LIDOCAINE 2% (20 MG/ML) 5 ML SYRINGE
INTRAMUSCULAR | Status: DC | PRN
  Administered 2021-01-01: 20 mg via INTRAVENOUS

## 2021-01-01 MED ORDER — ORAL CARE MOUTH RINSE: 15.0000 mL | Freq: Once | OROMUCOSAL | Status: AC

## 2021-01-01 MED ORDER — SODIUM CHLORIDE 0.9 % IV SOLN: INTRAVENOUS | Status: DC

## 2021-01-01 MED ORDER — DEXAMETHASONE SODIUM PHOSPHATE 10 MG/ML IJ SOLN
10.0000 mg | Freq: Once | INTRAMUSCULAR | Status: AC
  Administered 2021-01-02: 10 mg via INTRAVENOUS
  Filled 2021-01-01: qty 1

## 2021-01-01 MED ORDER — FENTANYL CITRATE (PF) 250 MCG/5ML IJ SOLN
INTRAMUSCULAR | Status: AC
  Filled 2021-01-01: qty 5

## 2021-01-01 MED ORDER — HYDROMORPHONE HCL 1 MG/ML IJ SOLN
0.2500 mg | INTRAMUSCULAR | Status: DC | PRN
  Administered 2021-01-01 (×3): 0.5 mg via INTRAVENOUS

## 2021-01-01 MED ORDER — METHOCARBAMOL 500 MG PO TABS
500.0000 mg | ORAL_TABLET | Freq: Four times a day (QID) | ORAL | Status: DC | PRN
  Administered 2021-01-02 – 2021-01-03 (×4): 500 mg via ORAL
  Filled 2021-01-01 (×4): qty 1

## 2021-01-01 MED ORDER — FENTANYL CITRATE (PF) 100 MCG/2ML IJ SOLN
INTRAMUSCULAR | Status: AC
  Filled 2021-01-01: qty 2

## 2021-01-01 MED ORDER — LACTATED RINGERS IV SOLN: INTRAVENOUS | Status: DC

## 2021-01-01 MED ORDER — OXYCODONE HCL 5 MG/5ML PO SOLN: 5.0000 mg | Freq: Once | ORAL | Status: DC | PRN

## 2021-01-01 MED ORDER — HYDROMORPHONE HCL 1 MG/ML IJ SOLN
INTRAMUSCULAR | Status: DC | PRN
  Administered 2021-01-01 (×2): 1 mg via INTRAVENOUS

## 2021-01-01 MED ORDER — DOCUSATE SODIUM 100 MG PO CAPS
100.0000 mg | ORAL_CAPSULE | Freq: Two times a day (BID) | ORAL | Status: DC
  Administered 2021-01-02 – 2021-01-03 (×3): 100 mg via ORAL
  Filled 2021-01-01 (×3): qty 1

## 2021-01-01 MED ORDER — SCOPOLAMINE 1 MG/3DAYS TD PT72
MEDICATED_PATCH | TRANSDERMAL | Status: AC
  Filled 2021-01-01: qty 1

## 2021-01-01 MED ORDER — METHOCARBAMOL 500 MG IVPB - SIMPLE MED
500.0000 mg | Freq: Four times a day (QID) | INTRAVENOUS | Status: DC | PRN
  Administered 2021-01-01: 500 mg via INTRAVENOUS
  Filled 2021-01-01: qty 50

## 2021-01-01 MED ORDER — CEFAZOLIN SODIUM-DEXTROSE 2-4 GM/100ML-% IV SOLN
2.0000 g | INTRAVENOUS | Status: AC
  Administered 2021-01-01: 2 g via INTRAVENOUS
  Filled 2021-01-01: qty 100

## 2021-01-01 MED ORDER — ONDANSETRON HCL 4 MG/2ML IJ SOLN
INTRAMUSCULAR | Status: DC | PRN
  Administered 2021-01-01: 4 mg via INTRAVENOUS

## 2021-01-01 MED ORDER — HYDROMORPHONE HCL 2 MG/ML IJ SOLN
INTRAMUSCULAR | Status: AC
  Filled 2021-01-01: qty 1

## 2021-01-01 MED ORDER — DEXAMETHASONE SODIUM PHOSPHATE 10 MG/ML IJ SOLN: 8.0000 mg | Freq: Once | INTRAMUSCULAR | Status: DC

## 2021-01-01 MED ORDER — ASPIRIN EC 325 MG PO TBEC
325.0000 mg | DELAYED_RELEASE_TABLET | Freq: Two times a day (BID) | ORAL | Status: DC
  Administered 2021-01-02 – 2021-01-03 (×3): 325 mg via ORAL
  Filled 2021-01-01 (×3): qty 1

## 2021-01-01 MED ORDER — PROMETHAZINE HCL 25 MG/ML IJ SOLN: 6.2500 mg | INTRAMUSCULAR | Status: DC | PRN

## 2021-01-01 MED ORDER — CHLORHEXIDINE GLUCONATE 0.12 % MT SOLN
15.0000 mL | Freq: Once | OROMUCOSAL | Status: AC
  Administered 2021-01-01: 15 mL via OROMUCOSAL

## 2021-01-01 MED ORDER — POLYETHYLENE GLYCOL 3350 17 G PO PACK: 17.0000 g | PACK | Freq: Every day | ORAL | Status: DC | PRN

## 2021-01-01 MED ORDER — SERTRALINE HCL 100 MG PO TABS
100.0000 mg | ORAL_TABLET | Freq: Every day | ORAL | Status: DC
  Administered 2021-01-02 – 2021-01-03 (×2): 100 mg via ORAL
  Filled 2021-01-01 (×2): qty 1

## 2021-01-01 MED ORDER — PROPOFOL 10 MG/ML IV BOLUS
INTRAVENOUS | Status: AC
  Filled 2021-01-01: qty 20

## 2021-01-01 MED ORDER — TRANEXAMIC ACID-NACL 1000-0.7 MG/100ML-% IV SOLN
1000.0000 mg | INTRAVENOUS | Status: AC
  Administered 2021-01-01: 1000 mg via INTRAVENOUS
  Filled 2021-01-01: qty 100

## 2021-01-01 MED ORDER — BUPIVACAINE-EPINEPHRINE 0.25% -1:200000 IJ SOLN
INTRAMUSCULAR | Status: AC
  Filled 2021-01-01: qty 1

## 2021-01-01 MED ORDER — ACETAMINOPHEN 10 MG/ML IV SOLN
1000.0000 mg | Freq: Four times a day (QID) | INTRAVENOUS | Status: DC
  Administered 2021-01-01: 1000 mg via INTRAVENOUS
  Filled 2021-01-01: qty 100

## 2021-01-01 MED ORDER — MAGNESIUM CITRATE PO SOLN: 1.0000 | Freq: Once | ORAL | Status: DC | PRN

## 2021-01-01 MED ORDER — SODIUM CHLORIDE 0.9 % IR SOLN
Status: DC | PRN
  Administered 2021-01-01: 1000 mL

## 2021-01-01 MED ORDER — MIDAZOLAM HCL 2 MG/2ML IJ SOLN: 0.5000 mg | Freq: Once | INTRAMUSCULAR | Status: DC | PRN

## 2021-01-01 MED ORDER — ONDANSETRON HCL 4 MG/2ML IJ SOLN: 4.0000 mg | Freq: Four times a day (QID) | INTRAMUSCULAR | Status: DC | PRN

## 2021-01-01 MED ORDER — METHOCARBAMOL 500 MG IVPB - SIMPLE MED
INTRAVENOUS | Status: AC
  Filled 2021-01-01: qty 50

## 2021-01-01 MED ORDER — DEXAMETHASONE SODIUM PHOSPHATE 10 MG/ML IJ SOLN
INTRAMUSCULAR | Status: DC | PRN
  Administered 2021-01-01: 10 mg via INTRAVENOUS

## 2021-01-01 MED ORDER — HYDROCODONE-ACETAMINOPHEN 5-325 MG PO TABS
1.0000 | ORAL_TABLET | ORAL | Status: DC | PRN
  Administered 2021-01-03: 1 via ORAL
  Filled 2021-01-01: qty 1

## 2021-01-01 MED ORDER — SCOPOLAMINE 1 MG/3DAYS TD PT72
1.0000 | MEDICATED_PATCH | TRANSDERMAL | Status: DC
  Administered 2021-01-01: 1.5 mg via TRANSDERMAL

## 2021-01-01 MED ORDER — PROPOFOL 10 MG/ML IV BOLUS
INTRAVENOUS | Status: DC | PRN
  Administered 2021-01-01: 200 mg via INTRAVENOUS
  Administered 2021-01-01: 50 mg via INTRAVENOUS

## 2021-01-01 MED ORDER — ONDANSETRON HCL 4 MG PO TABS: 4.0000 mg | ORAL_TABLET | Freq: Four times a day (QID) | ORAL | Status: DC | PRN

## 2021-01-01 MED ORDER — HYDROCODONE-ACETAMINOPHEN 7.5-325 MG PO TABS
1.0000 | ORAL_TABLET | ORAL | Status: DC | PRN
  Administered 2021-01-01 – 2021-01-02 (×3): 2 via ORAL
  Administered 2021-01-02: 1 via ORAL
  Administered 2021-01-02 (×3): 2 via ORAL
  Administered 2021-01-03: 1 via ORAL
  Filled 2021-01-01 (×2): qty 2
  Filled 2021-01-01: qty 1
  Filled 2021-01-01: qty 2
  Filled 2021-01-01: qty 1
  Filled 2021-01-01 (×3): qty 2

## 2021-01-01 MED ORDER — ROCURONIUM BROMIDE 10 MG/ML (PF) SYRINGE
PREFILLED_SYRINGE | INTRAVENOUS | Status: DC | PRN
  Administered 2021-01-01: 10 mg via INTRAVENOUS
  Administered 2021-01-01: 60 mg via INTRAVENOUS

## 2021-01-01 MED ORDER — BISACODYL 10 MG RE SUPP: 10.0000 mg | Freq: Every day | RECTAL | Status: DC | PRN

## 2021-01-01 MED ORDER — LABETALOL HCL 5 MG/ML IV SOLN
INTRAVENOUS | Status: DC | PRN
  Administered 2021-01-01: 2.5 mg via INTRAVENOUS
  Administered 2021-01-01: 5 mg via INTRAVENOUS

## 2021-01-01 MED ORDER — ACETAMINOPHEN 500 MG PO TABS: 1000.0000 mg | ORAL_TABLET | Freq: Once | ORAL | Status: DC

## 2021-01-01 MED ORDER — ALPRAZOLAM 0.25 MG PO TABS
0.2500 mg | ORAL_TABLET | Freq: Every evening | ORAL | Status: DC | PRN
  Administered 2021-01-02: 0.25 mg via ORAL
  Filled 2021-01-01: qty 1

## 2021-01-01 MED ORDER — METOCLOPRAMIDE HCL 5 MG PO TABS: 5.0000 mg | ORAL_TABLET | Freq: Three times a day (TID) | ORAL | Status: DC | PRN

## 2021-01-01 MED ORDER — POVIDONE-IODINE 10 % EX SWAB
2.0000 | Freq: Once | CUTANEOUS | Status: AC
Start: 2021-01-01 — End: 2021-01-01
  Administered 2021-01-01: 2 via TOPICAL

## 2021-01-01 MED ORDER — BUPIVACAINE-EPINEPHRINE (PF) 0.25% -1:200000 IJ SOLN
INTRAMUSCULAR | Status: DC | PRN
  Administered 2021-01-01: 35 mL via PERINEURAL

## 2021-01-01 MED ORDER — PHENOL 1.4 % MT LIQD: 1.0000 | OROMUCOSAL | Status: DC | PRN

## 2021-01-01 MED ORDER — MENTHOL 3 MG MT LOZG: 1.0000 | LOZENGE | OROMUCOSAL | Status: DC | PRN

## 2021-01-01 MED ORDER — CEFAZOLIN SODIUM-DEXTROSE 2-4 GM/100ML-% IV SOLN
2.0000 g | Freq: Four times a day (QID) | INTRAVENOUS | Status: AC
Start: 2021-01-02 — End: 2021-01-02
  Administered 2021-01-02 (×2): 2 g via INTRAVENOUS
  Filled 2021-01-01 (×2): qty 100

## 2021-01-01 SURGICAL SUPPLY — 49 items
BAG ZIPLOCK 12X15 (MISCELLANEOUS) ×2 IMPLANT
BLADE SAW SGTL 18X1.27X75 (BLADE) ×2 IMPLANT
BONE CEMENT GENTAMICIN (Cement) ×2 IMPLANT
CEMENT BONE GENTAMICIN 40 (Cement) ×1 IMPLANT
CLOTH BEACON ORANGE TIMEOUT ST (SAFETY) ×2 IMPLANT
CLSR STERI-STRIP ANTIMIC 1/2X4 (GAUZE/BANDAGES/DRESSINGS) ×2 IMPLANT
COVER PERINEAL POST (MISCELLANEOUS) ×2 IMPLANT
COVER SURGICAL LIGHT HANDLE (MISCELLANEOUS) ×2 IMPLANT
COVER WAND RF STERILE (DRAPES) IMPLANT
DECANTER SPIKE VIAL GLASS SM (MISCELLANEOUS) IMPLANT
DRAPE STERI IOBAN 125X83 (DRAPES) ×2 IMPLANT
DRAPE U-SHAPE 47X51 STRL (DRAPES) ×6 IMPLANT
DRESSING MEPILEX FLEX 4X4 (GAUZE/BANDAGES/DRESSINGS) IMPLANT
DRSG ADAPTIC 3X8 NADH LF (GAUZE/BANDAGES/DRESSINGS) ×2 IMPLANT
DRSG AQUACEL AG ADV 3.5X 6 (GAUZE/BANDAGES/DRESSINGS) ×2 IMPLANT
DRSG MEPILEX BORDER 4X8 (GAUZE/BANDAGES/DRESSINGS) IMPLANT
DRSG MEPILEX FLEX 4X4 (GAUZE/BANDAGES/DRESSINGS)
DURAPREP 26ML APPLICATOR (WOUND CARE) ×2 IMPLANT
ELECT REM PT RETURN 15FT ADLT (MISCELLANEOUS) ×2 IMPLANT
EVACUATOR 1/8 PVC DRAIN (DRAIN) IMPLANT
GAUZE SPONGE 4X4 12PLY STRL (GAUZE/BANDAGES/DRESSINGS) IMPLANT
GLOVE SRG 8 PF TXTR STRL LF DI (GLOVE) ×2 IMPLANT
GLOVE SURG ENC MOIS LTX SZ6.5 (GLOVE) ×4 IMPLANT
GLOVE SURG ENC MOIS LTX SZ7.5 (GLOVE) ×2 IMPLANT
GLOVE SURG ENC MOIS LTX SZ8 (GLOVE) ×6 IMPLANT
GLOVE SURG POLYISO LF SZ7 (GLOVE) ×2 IMPLANT
GLOVE SURG UNDER POLY LF SZ7 (GLOVE) ×4 IMPLANT
GLOVE SURG UNDER POLY LF SZ8 (GLOVE) ×2
GOWN STRL REUS W/TWL LRG LVL3 (GOWN DISPOSABLE) ×6 IMPLANT
HEAD FEM STD 32X+5 STRL (Hips) ×2 IMPLANT
KIT TURNOVER KIT A (KITS) ×2 IMPLANT
LINER ACET CUP 42MMX32MM (Hips) ×2 IMPLANT
NDL SAFETY ECLIPSE 18X1.5 (NEEDLE) ×1 IMPLANT
NEEDLE HYPO 18GX1.5 SHARP (NEEDLE) ×1
PACK ANTERIOR HIP CUSTOM (KITS) ×2 IMPLANT
PADDING CAST COTTON 6X4 STRL (CAST SUPPLIES) IMPLANT
PENCIL SMOKE EVACUATOR (MISCELLANEOUS) ×2 IMPLANT
STEM CEMENTED SUMMIT SZ2 (Stem) ×2 IMPLANT
STRIP CLOSURE SKIN 1/2X4 (GAUZE/BANDAGES/DRESSINGS) ×2 IMPLANT
SUT ETHIBOND NAB CT1 #1 30IN (SUTURE) ×2 IMPLANT
SUT MNCRL AB 4-0 PS2 18 (SUTURE) ×2 IMPLANT
SUT STRATAFIX 0 PDS 27 VIOLET (SUTURE) ×2
SUT VIC AB 2-0 CT1 27 (SUTURE) ×2
SUT VIC AB 2-0 CT1 TAPERPNT 27 (SUTURE) ×2 IMPLANT
SUTURE STRATFX 0 PDS 27 VIOLET (SUTURE) ×1 IMPLANT
SWAB COLLECTION DEVICE MRSA (MISCELLANEOUS) ×2 IMPLANT
SWAB CULTURE ESWAB REG 1ML (MISCELLANEOUS) ×2 IMPLANT
SYR 50ML LL SCALE MARK (SYRINGE) IMPLANT
TRAY FOLEY MTR SLVR 16FR STAT (SET/KITS/TRAYS/PACK) ×2 IMPLANT

## 2021-01-01 NOTE — Discharge Instructions (Signed)
Tiffany Gross, MD [facility] [address], [address] [place], [place] [address] [phone]  POSTOPERATIVE DIRECTIONS  BLOOD CLOT PREVENTION . Take a 325 mg Aspirin two times a day for three weeks following surgery. Then take an 81 mg Aspirin once a day for three weeks. Then discontinue Aspirin. Bonita Quin may resume your vitamins/supplements upon discharge from the hospital. . Do not take any NSAIDs (Advil, Aleve, Ibuprofen, Meloxicam, etc.) until you have discontinued the 325 mg Aspirin.  HOME CARE INSTRUCTIONS  . Remove items at home which could result in a fall. This includes throw rugs or furniture in walking pathways.   ICE to the affected hip as frequently as 20-30 minutes an hour and then as needed for pain and swelling. Continue to use ice on the hip for pain and swelling from surgery. You may notice swelling that will progress down to the foot and ankle. This is normal after surgery. Elevate the leg when you are not up walking on it.    Continue to use the breathing machine which will help keep your temperature down.  It is common for your temperature to cycle up and down following surgery, especially at night when you are not up moving around and exerting yourself.  The breathing machine keeps your lungs expanded and your temperature down.  DIET You may resume your previous home diet once your are discharged from the hospital.  DRESSING / WOUND CARE / SHOWERING . You have an adhesive waterproof bandage over the incision. Leave this in place until your first follow-up appointment. Once you remove this you will not need to place another bandage.  . You may begin showering 3 days following surgery, but do not submerge the incision under water.  ACTIVITY . For the first 3-5 days, it is important to rest and keep the operative leg elevated. You should, as a general rule, rest for 50 minutes and walk/stretch for 10 minutes per hour. After 5  days, you may slowly increase activity as tolerated.  Marland Kitchen Perform the exercises you were provided twice a day for about 15-20 minutes each session. Begin these 2 days following surgery. . Walk with your walker as instructed. Use the walker until you are comfortable transitioning to a cane. Walk with the cane in the opposite hand of the operative leg. You may discontinue the cane once you are comfortable and walking steadily. . Avoid periods of inactivity such as sitting longer than an hour when not asleep. This helps prevent blood clots.  . Do not drive a car for 6 weeks or until released by your surgeon.  . Do not drive while taking narcotics.  TED HOSE STOCKINGS Wear the elastic stockings on both legs for three weeks following surgery during the day. You may remove them at night while sleeping.  WEIGHT BEARING Weight bearing as tolerated with assist device (walker, cane, etc) as directed, use it as long as suggested by your surgeon or therapist, typically at least 4-6 weeks.  POSTOPERATIVE CONSTIPATION PROTOCOL Constipation - defined medically as fewer than three stools per week and severe constipation as less than one stool per week.  One of the most common issues patients have following surgery is constipation.  Even if you have a regular bowel pattern at home, your normal regimen is likely to be disrupted due to multiple reasons following surgery.  Combination of anesthesia, postoperative narcotics, change in appetite and fluid intake all can affect your bowels.  In order to  avoid complications following surgery, here are some recommendations in order to help you during your recovery period.  . Colace (docusate) - Pick up an over-the-counter form of Colace or another stool softener and take twice a day as long as you are requiring postoperative pain medications.  Take with a full glass of water daily.  If you experience loose stools or diarrhea, hold the colace until you stool forms back up.  If  your symptoms do not get better within 1 week or if they get worse, check with your doctor. . Dulcolax (bisacodyl) - Pick up over-the-counter and take as directed by the product packaging as needed to assist with the movement of your bowels.  Take with a full glass of water.  Use this product as needed if not relieved by Colace only.  . MiraLax (polyethylene glycol) - Pick up over-the-counter to have on hand.  MiraLax is a solution that will increase the amount of water in your bowels to assist with bowel movements.  Take as directed and can mix with a glass of water, juice, soda, coffee, or tea.  Take if you go more than two days without a movement.Do not use MiraLax more than once per day. Call your doctor if you are still constipated or irregular after using this medication for 7 days in a row.  If you continue to have problems with postoperative constipation, please contact the office for further assistance and recommendations.  If you experience "the worst abdominal pain ever" or develop nausea or vomiting, please contact the office immediatly for further recommendations for treatment.  ITCHING  If you experience itching with your medications, try taking only a single pain pill, or even half a pain pill at a time.  You can also use Benadryl over the counter for itching or also to help with sleep.   MEDICATIONS See your medication summary on the "After Visit Summary" that the nursing staff will review with you prior to discharge.  You may have some home medications which will be placed on hold until you complete the course of blood thinner medication.  It is important for you to complete the blood thinner medication as prescribed by your surgeon.  Continue your approved medications as instructed at time of discharge.  PRECAUTIONS If you experience chest pain or shortness of breath - call 911 immediately for transfer to the hospital emergency department.  If you develop a fever greater that 101 F,  purulent drainage from wound, increased redness or drainage from wound, foul odor from the wound/dressing, or calf pain - CONTACT YOUR SURGEON.                                                   FOLLOW-UP APPOINTMENTS Make sure you keep all of your appointments after your operation with your surgeon and caregivers. You should call the office at the above phone number and make an appointment for approximately two weeks after the date of your surgery or on the date instructed by your surgeon outlined in the "After Visit Summary".  RANGE OF MOTION AND STRENGTHENING EXERCISES  These exercises are designed to help you keep full movement of your hip joint. Follow your caregiver's or physical therapist's instructions. Perform all exercises about fifteen times, three times per day or as directed. Exercise both hips, even if you have had only  one joint replacement. These exercises can be done on a training (exercise) mat, on the floor, on a table or on a bed. Use whatever works the best and is most comfortable for you. Use music or television while you are exercising so that the exercises are a pleasant break in your day. This will make your life better with the exercises acting as a break in routine you can look forward to.  . Lying on your back, slowly slide your foot toward your buttocks, raising your knee up off the floor. Then slowly slide your foot back down until your leg is straight again.  . Lying on your back spread your legs as far apart as you can without causing discomfort.  . Lying on your side, raise your upper leg and foot straight up from the floor as far as is comfortable. Slowly lower the leg and repeat.  . Lying on your back, tighten up the muscle in the front of your thigh (quadriceps muscles). You can do this by keeping your leg straight and trying to raise your heel off the floor. This helps strengthen the largest muscle supporting your knee.  . Lying on your back, tighten up the muscles of  your buttocks both with the legs straight and with the knee bent at a comfortable angle while keeping your heel on the floor.   POST-OPERATIVE OPIOID TAPER INSTRUCTIONS: . It is important to wean off of your opioid medication as soon as possible. If you do not need pain medication after your surgery it is ok to stop day one. Marland Kitchen. Opioids include: o Codeine, Hydrocodone(Norco, Vicodin), Oxycodone(Percocet, oxycontin) and hydromorphone amongst others.  . Long term and even short term use of opiods can cause: o Increased pain response o Dependence o Constipation o Depression o Respiratory depression o And more.  . Withdrawal symptoms can include o Flu like symptoms o Nausea, vomiting o And more . Techniques to manage these symptoms o Hydrate well o Eat regular healthy meals o Stay active o Use relaxation techniques(deep breathing, meditating, yoga) . Do Not substitute Alcohol to help with tapering . If you have been on opioids for less than two weeks and do not have pain than it is ok to stop all together.  . Plan to wean off of opioids o This plan should start within one week post op of your joint replacement. o Maintain the same interval or time between taking each dose and first decrease the dose.  o Cut the total daily intake of opioids by one tablet each day o Next start to increase the time between doses. o The last dose that should be eliminated is the evening dose.     IF YOU ARE TRANSFERRED TO A SKILLED REHAB FACILITY If the patient is transferred to a skilled rehab facility following release from the hospital, a list of the current medications will be sent to the facility for the patient to continue.  When discharged from the skilled rehab facility, please have the facility set up the patient's Home Health Physical Therapy prior to being released. Also, the skilled facility will be responsible for providing the patient with their medications at time of release from the facility  to include their pain medication, the muscle relaxants, and their blood thinner medication. If the patient is still at the rehab facility at time of the two week follow up appointment, the skilled rehab facility will also need to assist the patient in arranging follow up appointment in our office  and any transportation needs.  MAKE SURE YOU:  . Understand these instructions.  . Get help right away if you are not doing well or get worse.   Pick up stool softner and laxative for home use following surgery while on pain medications. Do not submerge incision under water. Please use good hand washing techniques while changing dressing each day. May shower starting three days after surgery. Please use a clean towel to pat the incision dry following showers. Continue to use ice for pain and swelling after surgery. Do not use any lotions or creams on the incision until instructed by your surgeon.

## 2021-01-01 NOTE — Anesthesia Postprocedure Evaluation (Signed)
Anesthesia Post Note  Patient: Tiffany Leonard  Procedure(s) Performed: Right hip resection arthroplasty with antibiotic spacer (Right Hip)     Patient location during evaluation: PACU Anesthesia Type: General Level of consciousness: awake and alert, patient cooperative and oriented Pain management: pain level controlled Vital Signs Assessment: post-procedure vital signs reviewed and stable Respiratory status: spontaneous breathing, nonlabored ventilation, respiratory function stable and patient connected to nasal cannula oxygen Cardiovascular status: blood pressure returned to baseline and stable Postop Assessment: no apparent nausea or vomiting Anesthetic complications: no   No complications documented.  Last Vitals:  Vitals:   01/01/21 2102 01/01/21 2115  BP:  124/76  Pulse: 89   Resp: 13 15  Temp: (P) 36.9 C   SpO2: 100% 100%    Last Pain:  Vitals:   01/01/21 2102  TempSrc:   PainSc: 4                  Aayansh Codispoti,E. Salwa Bai

## 2021-01-01 NOTE — Anesthesia Preprocedure Evaluation (Addendum)
Anesthesia Evaluation  Patient identified by MRN, date of birth, ID band Patient awake    Reviewed: Allergy & Precautions, NPO status , Patient's Chart, lab work & pertinent test results  History of Anesthesia Complications Negative for: history of anesthetic complications  Airway Mallampati: II  TM Distance: >3 FB Neck ROM: Full    Dental  (+) Dental Advisory Given, Teeth Intact   Pulmonary  12/29/2020 SARS coronavirus NEG   breath sounds clear to auscultation       Cardiovascular negative cardio ROS   Rhythm:Regular Rate:Normal     Neuro/Psych Anxiety negative neurological ROS     GI/Hepatic negative GI ROS, Neg liver ROS,   Endo/Other  negative endocrine ROS  Renal/GU negative Renal ROS     Musculoskeletal Septic arthritis hip   Abdominal   Peds  Hematology negative hematology ROS (+)   Anesthesia Other Findings   Reproductive/Obstetrics                            Anesthesia Physical Anesthesia Plan  ASA: II  Anesthesia Plan: General   Post-op Pain Management:    Induction: Intravenous  PONV Risk Score and Plan: 3 and Ondansetron, Treatment may vary due to age or medical condition, Scopolamine patch - Pre-op and Dexamethasone  Airway Management Planned: LMA  Additional Equipment: None  Intra-op Plan:   Post-operative Plan:   Informed Consent: I have reviewed the patients History and Physical, chart, labs and discussed the procedure including the risks, benefits and alternatives for the proposed anesthesia with the patient or authorized representative who has indicated his/her understanding and acceptance.     Dental advisory given  Plan Discussed with: CRNA and Surgeon  Anesthesia Plan Comments:        Anesthesia Quick Evaluation

## 2021-01-01 NOTE — Brief Op Note (Signed)
01/01/2021  7:52 PM  PATIENT:  Tiffany Leonard  35 y.o. female  PRE-OPERATIVE DIAGNOSIS:  septic arthritis right hip  POST-OPERATIVE DIAGNOSIS:  septic arthritis right hip  PROCEDURE:  Procedure(s) with comments: Right hip resection arthroplasty with antibiotic spacer (Right) -  SURGEON:  Surgeon(s) and Role:    Ollen Gross, MD - Primary  PHYSICIAN ASSISTANT:   ASSISTANTS: Leilani Able, PA-C   ANESTHESIA:   general  EBL:  700 mL   BLOOD ADMINISTERED:none  DRAINS: none   LOCAL MEDICATIONS USED:  MARCAINE     COUNTS:  YES  TOURNIQUET:  * No tourniquets in log *  DICTATION: .Other Dictation: Dictation Number [id number]  PLAN OF CARE: Admit to inpatient   PATIENT DISPOSITION:  PACU - hemodynamically stable.

## 2021-01-01 NOTE — Transfer of Care (Signed)
Immediate Anesthesia Transfer of Care Note  Patient: Tiffany Leonard  Procedure(s) Performed: Right hip resection arthroplasty with antibiotic spacer (Right Hip)  Patient Location: PACU  Anesthesia Type:General  Level of Consciousness: awake, alert , oriented and patient cooperative  Airway & Oxygen Therapy: Patient Spontanous Breathing and Patient connected to face mask oxygen  Post-op Assessment: Report given to RN, Post -op Vital signs reviewed and stable and Patient moving all extremities X 4  Post vital signs: stable  Last Vitals:  Vitals Value Taken Time  BP 167/85 01/01/21 2015  Temp    Pulse 98 01/01/21 2019  Resp 20 01/01/21 2019  SpO2 97 % 01/01/21 2019  Vitals shown include unvalidated device data.  Last Pain:  Vitals:   01/01/21 1442  TempSrc: Oral  PainSc: 0-No pain      Patients Stated Pain Goal: 3 (01/01/21 1442)  Complications: No complications documented.

## 2021-01-01 NOTE — Anesthesia Procedure Notes (Signed)
Procedure Name: Intubation Performed by: Leeana Creer H, CRNA Pre-anesthesia Checklist: Patient identified, Emergency Drugs available, Suction available and Patient being monitored Patient Re-evaluated:Patient Re-evaluated prior to induction Oxygen Delivery Method: Circle System Utilized Preoxygenation: Pre-oxygenation with 100% oxygen Induction Type: IV induction Ventilation: Mask ventilation without difficulty Laryngoscope Size: Miller and 2 Grade View: Grade I Tube type: Oral Tube size: 7.0 mm Number of attempts: 1 Airway Equipment and Method: Stylet Placement Confirmation: ETT inserted through vocal cords under direct vision,  positive ETCO2 and breath sounds checked- equal and bilateral Secured at: 21 cm Tube secured with: Tape Dental Injury: Teeth and Oropharynx as per pre-operative assessment        

## 2021-01-01 NOTE — H&P (Signed)
ADMISSION H&P  Subjective:  Chief Complaint: Right hip pain   HPI: Tiffany Leonard, 35 y.o. female presented to our clinic on 12/27/2020 for a recheck of her right hip. History significant for septic arthritis of the right hip, s/p irrigation and debridement with PICC line antibiotic therapy x 6 weeks. Has been off antibiotics for several weeks. Had an increase in pain to the point she required narcotic medication and use of a walker. Denied injury or increased activity. Obtained labs, which showed a markedly elevated ESR/CRP. Due to this, a stat MRI was ordered which showed a joint effusion and osteomyelitis.   Patient Active Problem List   Diagnosis Date Noted  . Medication monitoring encounter 09/23/2020  . Septic arthritis of hip ([place]) 09/11/2020  . Pregnancy 12/16/2019  . Encounter for supervision of normal first pregnancy in third trimester 12/15/2019  . Active labor 07/11/2015    Past Medical History:  Diagnosis Date  . Anxiety   . Arthritis     Past Surgical History:  Procedure Laterality Date  . broken pelvis  2014  . INCISION AND DRAINAGE HIP Right 09/11/2020   Procedure: Right hip arthrotomy, irrigation and debridement-anterior approach;  Surgeon: Gaynelle Arabian, MD;  Location: [facility];  Service: Orthopedics;  Laterality: Right;  51mn  . SHOULDER SURGERY  2014    Prior to Admission medications   Medication Sig Start Date End Date Taking? Authorizing Provider  ALPRAZolam (Duanne Moron 0.25 MG tablet Take 0.25 mg by mouth at bedtime as needed for anxiety.   Yes [provider]  HYDROcodone-acetaminophen (NORCO/VICODIN) 5-325 MG tablet Take 1 tablet by mouth at bedtime as needed for severe pain or moderate pain. 09/05/20  Yes [provider]  methocarbamol (ROBAXIN) 500 MG tablet Take 1 tablet (500 mg total) by mouth every 6 (six) hours as needed for muscle spasms. Patient taking differently: Take 500 mg by mouth at bedtime as needed for muscle  spasms. 09/13/20  Yes Linsey Hirota L, PA  sertraline (ZOLOFT) 100 MG tablet Take 100 mg by mouth daily. 11/12/19  Yes [provider]  levonorgestrel (MIRENA, 52 MG,) 20 MCG/24HR IUD 1 each by Intrauterine route once.    [provider]    Allergies  Allergen Reactions  . Banana Itching    Pt reports itching in mouth and throat    Social History   Socioeconomic History  . Marital status: Married    Spouse name: Not on file  . Number of children: Not on file  . Years of education: Not on file  . Highest education level: Not on file  Occupational History  . Not on file  Tobacco Use  . Smoking status: Never Smoker  . Smokeless tobacco: Never Used  Vaping Use  . Vaping Use: Never used  Substance and Sexual Activity  . Alcohol use: Not Currently  . Drug use: Never  . Sexual activity: Yes    Birth control/protection: I.U.D.  Other Topics Concern  . Not on file  Social History Narrative  . Not on file   Social Determinants of Health   Financial Resource Strain: Not on file  Food Insecurity: Not on file  Transportation Needs: Not on file  Physical Activity: Not on file  Stress: Not on file  Social Connections: Not on file  Intimate Partner Violence: Not on file    Tobacco Use: Low Risk   . Smoking Tobacco Use: Never Smoker  . Smokeless Tobacco Use: Never Used   Social History  Substance and Sexual Activity  Alcohol Use Not Currently    History reviewed. No pertinent family history.  Review of Systems  Constitutional: Negative for chills and fever.  HENT: Negative for congestion, sore throat and tinnitus.   Eyes: Negative for double vision, photophobia and pain.  Respiratory: Negative for cough, shortness of breath and wheezing.   Cardiovascular: Negative for chest pain, palpitations and orthopnea.  Gastrointestinal: Negative for heartburn, nausea and vomiting.  Genitourinary: Negative for dysuria, frequency and urgency.   Musculoskeletal: Positive for joint pain.  Neurological: Negative for dizziness, weakness and headaches.     Objective:  Physical Exam: Alert and oriented, no acute distress  Antalgic gait pattern, ambulating with a walker  Right hip exam: Pain with passive range of motion ROM 110 degrees flexion, 10 IR, 10 degrees ER, 20 degrees abduction  Sensation to light touch and motor function intact in LE. Distal pulses 2+. Calves soft and nontender.  Vital signs in last 24 hours: Temp:  [98.9 F (37.2 C)] 98.9 F (37.2 C) (05/16 1442) Pulse Rate:  [79] 79 (05/16 1442) Resp:  [16] 16 (05/16 1442) BP: (119)/(70) 119/70 (05/16 1442) SpO2:  [94 %] 94 % (05/16 1442)   Imaging Review: MRI of the right hip shows probable persistent septic arthritis and osteomyelitis with large hip joint effusion extending through the capsule and extending superiorly along the iliopsoas tendon and deep to the iliacus muscle. New flattening of the femoral head. Hypointense marrow edema present in the femur with unchanged cystic change in the superior acetabulum.  Pertinent Labs (12/27/20): ESR: 68 from 11 on 3/16 CRP: 171.4 from 3.7 on 3/16   Assessment/Plan:  Septic arthritis, right hip  Stat MRI of [name]'s right hip came back on Thursday afternoon. This showed a large effusion with osteomyelitis in the femoral head. Called and spoke with Dr. Wynelle Link, we will plan for right hip resection arthroplasty with antibiotic spacer on Monday afternoon. She will need a cemented stem, will use Gentamycin cement as her previous cultures showed better susceptibility with this opposed to vancomycin. I called Ms. Gallaway and spoke with her in detail about her lab results, MRI, and need for surgery on Friday afternoon. Patient is aware of the risks and elects to proceed. We will consult ID for assistance with management.   Theresa Duty, PA-C Orthopedic Surgery [facility]

## 2021-01-02 ENCOUNTER — Inpatient Hospital Stay: Payer: Self-pay

## 2021-01-02 ENCOUNTER — Other Ambulatory Visit: Payer: Self-pay

## 2021-01-02 DIAGNOSIS — M00051 Staphylococcal arthritis, right hip: Secondary | ICD-10-CM | POA: Diagnosis not present

## 2021-01-02 LAB — CBC
HCT: 34.1 % — ABNORMAL LOW (ref 36.0–46.0)
Hemoglobin: 10.7 g/dL — ABNORMAL LOW (ref 12.0–15.0)
MCH: 27.3 pg (ref 26.0–34.0)
MCHC: 31.4 g/dL (ref 30.0–36.0)
MCV: 87 fL (ref 80.0–100.0)
Platelets: 352 10*3/uL (ref 150–400)
RBC: 3.92 MIL/uL (ref 3.87–5.11)
RDW: 14.6 % (ref 11.5–15.5)
WBC: 10.8 10*3/uL — ABNORMAL HIGH (ref 4.0–10.5)
nRBC: 0 % (ref 0.0–0.2)

## 2021-01-02 LAB — BASIC METABOLIC PANEL
Anion gap: 7 (ref 5–15)
BUN: 8 mg/dL (ref 6–20)
CO2: 26 mmol/L (ref 22–32)
Calcium: 8.7 mg/dL — ABNORMAL LOW (ref 8.9–10.3)
Chloride: 101 mmol/L (ref 98–111)
Creatinine, Ser: 0.69 mg/dL (ref 0.44–1.00)
GFR, Estimated: >60 mL/min (ref >60)
Glucose, Bld: 158 mg/dL — ABNORMAL HIGH (ref 70–99)
Potassium: 3.9 mmol/L (ref 3.5–5.1)
Sodium: 134 mmol/L — ABNORMAL LOW (ref 135–145)

## 2021-01-02 LAB — ACID FAST SMEAR (AFB, MYCOBACTERIA): Acid Fast Smear: NEGATIVE

## 2021-01-02 MED ORDER — SODIUM CHLORIDE 0.9% FLUSH: 10.0000 mL | INTRAVENOUS | Status: DC | PRN

## 2021-01-02 MED ORDER — CEFAZOLIN SODIUM-DEXTROSE 2-4 GM/100ML-% IV SOLN
2.0000 g | Freq: Three times a day (TID) | INTRAVENOUS | Status: DC
  Administered 2021-01-02 – 2021-01-03 (×4): 2 g via INTRAVENOUS
  Filled 2021-01-02 (×4): qty 100

## 2021-01-02 MED ORDER — CHLORHEXIDINE GLUCONATE CLOTH 2 % EX PADS
6.0000 | MEDICATED_PAD | Freq: Every day | CUTANEOUS | Status: DC
  Administered 2021-01-03: 6 via TOPICAL

## 2021-01-02 NOTE — Progress Notes (Signed)
Pharmacy Antibiotic Note  Tiffany Leonard is a 35 y.o. female admitted on 01/01/2021 with septic arthritis of the R hip s/p resection arthroplasty with antibiotic spacer 5/16.  Pharmacy has been consulted for cefazolin dosing.  Plan: Cefazolin 2g IV q8h Follow up renal function & cultures  Height: 5\' 10"  (177.8 cm) Weight: 79.4 kg (175 lb) IBW/kg (Calculated) : 68.5  Temp (24hrs), Avg:98.3 F (36.8 C), Min:97.9 F (36.6 C), Max:98.4 F (36.9 C)  Recent Labs  Lab 01/01/21 1510 01/02/21 0336  WBC 8.0 10.8*  CREATININE 0.64 0.69    Estimated Creatinine Clearance: 108.2 mL/min (by C-G formula based on SCr of 0.69 mg/dL).    Allergies  Allergen Reactions  . Banana Itching    Pt reports itching in mouth and throat    Antimicrobials this admission:  5/17 Cefazolin >>  Dose adjustments this admission:   Microbiology results:  5/16 R hip synovial fluid: ngtd 5/16 R hip AFB:  Thank you for allowing pharmacy to be a part of this patient's care.  6/16, PharmD, BCPS Pharmacy: [phone] 01/02/2021 4:01 PM

## 2021-01-02 NOTE — Plan of Care (Signed)
Plan of care initiated and discussed with the patient. 

## 2021-01-02 NOTE — Progress Notes (Signed)
   Subjective: 1 Day Post-Op Procedure(s) (LRB): Right hip resection arthroplasty with antibiotic spacer (Right) Patient reports pain as mild.   Patient seen in rounds by Dr. Lequita Halt. Patient reports pain is much improved from preop. Was up several times last night to void. Denies chest pain or SOB. We will begin therapy today.   Objective: Vital signs in last 24 hours: Temp:  [97.9 F (36.6 C)-98.9 F (37.2 C)] 98.2 F (36.8 C) (05/17 0527) Pulse Rate:  [75-110] 85 (05/17 0527) Resp:  [12-25] 16 (05/17 0527) BP: (88-167)/(56-100) 111/92 (05/17 0527) SpO2:  [94 %-100 %] 100 % (05/17 0527)  Intake/Output from previous day:  Intake/Output Summary (Last 24 hours) at 01/02/2021 0807 Last data filed at 01/02/2021 0600 Gross per 24 hour  Intake 4331.31 ml  Output 700 ml  Net 3631.31 ml     Intake/Output this shift: No intake/output data recorded.  Labs: Recent Labs    01/01/21 1510 01/02/21 0336  HGB 12.8 10.7*   Recent Labs    01/01/21 1510 01/02/21 0336  WBC 8.0 10.8*  RBC 4.72 3.92  HCT 39.7 34.1*  PLT 360 352   Recent Labs    01/01/21 1510 01/02/21 0336  NA 138 134*  K 3.9 3.9  CL 101 101  CO2 28 26  BUN 11 8  CREATININE 0.64 0.69  GLUCOSE 84 158*  CALCIUM 9.5 8.7*   Recent Labs    01/01/21 1510  INR 1.1    Exam: General - Patient is Alert and Oriented Extremity - Neurologically intact Neurovascular intact Sensation intact distally Dorsiflexion/Plantar flexion intact Dressing - dressing C/D/I Motor Function - intact, moving foot and toes well on exam.   Past Medical History:  Diagnosis Date  . Anxiety   . Arthritis     Assessment/Plan: 1 Day Post-Op Procedure(s) (LRB): Right hip resection arthroplasty with antibiotic spacer (Right) Active Problems:   Septic arthritis of hip (HCC)  Estimated body mass index is 25.11 kg/m as calculated from the following:   Height as of this encounter: 5\' 10"  (1.778 m).   Weight as of this  encounter: 79.4 kg. Advance diet Up with therapy  DVT Prophylaxis - Aspirin Weight bearing as tolerated. Begin therapy.  Plan is to go Home after hospital stay.  Introp cultures pending, previous infection was S. aureus with IV cefazolin via PICC Will consult infectious disease today for antibiotic management Order for PICC line placed  Possible discharge tomorrow if PICC line and ID recs in  , PA-C Orthopedic Surgery [phone] 01/02/2021, 8:07 AM

## 2021-01-02 NOTE — Plan of Care (Signed)
  Problem: Clinical Measurements: Goal: Diagnostic test results will improve Outcome: Progressing   Problem: Clinical Measurements: Goal: Respiratory complications will improve Outcome: Progressing   Problem: Clinical Measurements: Goal: Cardiovascular complication will be avoided Outcome: Progressing   Problem: Elimination: Goal: Will not experience complications related to bowel motility Outcome: Progressing   Problem: Education: Goal: Understanding of discharge needs will improve Outcome: Progressing   Problem: Pain Management: Goal: Pain level will decrease with appropriate interventions Outcome: Progressing

## 2021-01-02 NOTE — Consult Note (Signed)
[facility] for Infectious Disease       Reason for Consult: septic arthritis    Referring Physician: Dr. Maureen Ralphs  Active Problems:   Septic arthritis of hip ([place])   . aspirin EC  325 mg Oral BID  . docusate sodium  100 mg Oral BID  . sertraline  100 mg Oral Daily    Recommendations: cefazolin through at least June 27th picc line Will place OPAT  Assessment: She has a native hip septic arthritis and relapsed after treatment and now with osteomyelitis and s/p resection arthroplasty.    Antibiotics: Cefazolin perioperatively   HPI: Tiffany Leonard is a 35 y.o. female with a history of a right native septic arthritis with MSSA in January 2022 comes in with a reoccurence of infection.  She initially developed a hip effusion and WBCs of 3,047 and cultures MSSA from aspiration and underwent debridement of the hip on 09/11/20.  She had about 3 months of hip pain prior to that and underwent intra-articular hip injection but did not improve.  MRI noted a hip effusion and findings as above with aspiration.  MRI also noted significant erosion of the hip.  She completed 6 weeks of IV antibiotics on 10/20/20 and picc line was removed.  She returned to Dr. Maureen Ralphs on 5/11 and was having more pain and particularly weakness.  ESR and CRP were significantly elevated and a repeat MRI noted a joint effusion now and osteomyelitis, by report.  She went to the OR today and underwent resection arthroplasty with removal of the femoral head and debridement of the acetabulum.  The femur was also cemented with antibiotic-impregnated cement.     Review of Systems:  Constitutional: negative for fevers and chills Gastrointestinal: negative for nausea and diarrhea Integument/breast: negative for rash All other systems reviewed and are negative    Past Medical History:  Diagnosis Date  . Anxiety   . Arthritis     Social History   Tobacco Use  . Smoking status: Never Smoker  .  Smokeless tobacco: Never Used  Vaping Use  . Vaping Use: Never used  Substance Use Topics  . Alcohol use: Not Currently  . Drug use: Never    [place]: + cardiac disease  Allergies  Allergen Reactions  . Banana Itching    Pt reports itching in mouth and throat    Physical Exam: Constitutional: in no apparent distress  Vitals:   01/02/21 0527 01/02/21 0926  BP: (!) 111/92 (!) 148/86  Pulse: 85 (!) 59  Resp: 16 18  Temp: 98.2 F (36.8 C) 98.4 F (36.9 C)  SpO2: 100% 99%   EYES: anicteric Cardiovascular: Cor RRR Respiratory: clear; Skin: negatives: no rash Neuro: non-focal  Lab Results  Component Value Date   WBC 10.8 (H) 01/02/2021   HGB 10.7 (L) 01/02/2021   HCT 34.1 (L) 01/02/2021   MCV 87.0 01/02/2021   PLT 352 01/02/2021    Lab Results  Component Value Date   CREATININE 0.69 01/02/2021   BUN 8 01/02/2021   NA 134 (L) 01/02/2021   K 3.9 01/02/2021   CL 101 01/02/2021   CO2 26 01/02/2021    Lab Results  Component Value Date   ALT 32 01/01/2021   AST 20 01/01/2021   ALKPHOS 83 01/01/2021     Microbiology: Recent Results (from the past 240 hour(s))  SARS CORONAVIRUS 2 (TAT 6-24 HRS) Nasopharyngeal Nasopharyngeal Swab     Status: None   Collection Time: 12/29/20  2:37  PM   Specimen: Nasopharyngeal Swab  Result Value Ref Range Status   SARS Coronavirus 2 NEGATIVE NEGATIVE Final    Comment: (NOTE) SARS-CoV-2 target nucleic acids are NOT DETECTED.  The SARS-CoV-2 RNA is generally detectable in upper and lower respiratory specimens during the acute phase of infection. Negative results do not preclude SARS-CoV-2 infection, do not rule out co-infections with other pathogens, and should not be used as the sole basis for treatment or other patient management decisions. Negative results must be combined with clinical observations, patient history, and epidemiological information. The expected result is Negative.  Fact Sheet for  Patients: SugarRoll.be  Fact Sheet for Healthcare Providers: https://www.woods-mathews.com/  This test is not yet approved or cleared by the [place] FDA and  has been authorized for detection and/or diagnosis of SARS-CoV-2 by FDA under an Emergency Use Authorization (EUA). This EUA will remain  in effect (meaning this test can be used) for the duration of the COVID-19 declaration under Se ction 564(b)(1) of the Act, 21 U.S.C. section 360bbb-3(b)(1), unless the authorization is terminated or revoked sooner.  Performed at [facility], [place] [address], [place], [place] [address]   Surgical pcr screen     Status: None   Collection Time: 01/01/21  2:07 PM   Specimen: Nasal Mucosa; Nasal Swab  Result Value Ref Range Status   MRSA, PCR NEGATIVE NEGATIVE Final   Staphylococcus aureus NEGATIVE NEGATIVE Final    Comment: (NOTE) The Xpert SA Assay (FDA approved for NASAL specimens in patients 78 years of age and older), is one component of a comprehensive surveillance program. It is not intended to diagnose infection nor to guide or monitor treatment. Performed at [facility], [place] [address], [place], [place] [address]   Aerobic/Anaerobic Culture w Gram Stain (surgical/deep wound)     Status: None (Preliminary result)   Collection Time: 01/01/21  6:44 PM   Specimen: Synovial, Right Hip; Body Fluid  Result Value Ref Range Status   Specimen Description   Final    HIP RIGHT Performed at [facility] [address], [place], [place] [address]    Special Requests   Final    NONE Performed at [facility], [place] [address], [place], [place] [address]    Gram Stain NO WBC SEEN NO ORGANISMS SEEN   Final   Culture   Final    NO GROWTH < 12 HOURS Performed at [facility], [place] [address], [place],  [address]    Report Status PENDING  Incomplete    Thayer Headings,  [facility] for Infectious Disease [place] [email or web address] 01/02/2021, 3:23 PM

## 2021-01-02 NOTE — Progress Notes (Signed)
Peripherally Inserted Central Catheter Placement  The IV Nurse has discussed with the patient and/or persons authorized to consent for the patient, the purpose of this procedure and the potential benefits and risks involved with this procedure.  The benefits include less needle sticks, lab draws from the catheter, and the patient may be discharged home with the catheter. Risks include, but not limited to, infection, bleeding, blood clot (thrombus formation), and puncture of an artery; nerve damage and irregular heartbeat and possibility to perform a PICC exchange if needed/ordered by physician.  Alternatives to this procedure were also discussed.  Bard Power PICC patient education guide, fact sheet on infection prevention and patient information card has been provided to patient /or left at bedside.  PICC placed by Norlen Apolinario RN. PICC Placement Documentation  PICC Single Lumen 01/02/21 Right Basilic 42 cm 0 cm (Active)  Indication for Insertion or Continuance of Line Home intravenous therapies (PICC only) 01/02/21 2147  Exposed Catheter (cm) 0 cm 01/02/21 2147  Site Assessment Clean;Dry;Intact 01/02/21 2147  Line Status Blood return noted;Flushed;Saline locked 01/02/21 2147  Dressing Type Transparent;Occlusive;Securing device 01/02/21 2147  Dressing Status Clean;Dry;Intact 01/02/21 2147  Antimicrobial disc in place? Yes 01/02/21 2147  Safety Lock Not Applicable 01/02/21 2147  Line Adjustment (NICU/IV Team Only) No 01/02/21 2147  Dressing Intervention New dressing 01/02/21 2147  Dressing Change Due 01/09/21 01/02/21 2147       Christeen Douglas 01/02/2021, 9:52 PM

## 2021-01-02 NOTE — Evaluation (Signed)
Physical Therapy One Time Evaluation Patient Details Name: Tiffany Leonard MRN: [id number] DOB: [birth year] Today's Date: 01/02/2021   History of Present Illness  Pt is a 35 year female with hx of arthrotomy and I&D right hip on 09/12/20.  Pt with increased pain in right leg and unable to bear weight recently.  MRI showed a large effusion with osteomyelitis in the femoral head. Pt s/p right hip resection arthroplasty with antibiotic spacer on 01/01/21.  Clinical Impression  Patient evaluated by Physical Therapy with no further acute PT needs identified. All education has been completed and the patient has no further questions.  Pt mobilizing well and ambulated and performed steps without physical assist.  Pt performed exercises and provided with HEP.  Pt declines any further acute PT needs at this time. See below for any follow-up Physical Therapy or equipment needs. PT is signing off. Thank you for this referral.     Follow Up Recommendations Follow surgeon's recommendation for DC plan and follow-up therapies (provided HEP)    Equipment Recommendations  None recommended by PT    Recommendations for Other Services       Precautions / Restrictions Precautions Precautions: None Restrictions Weight Bearing Restrictions: No      Mobility  Bed Mobility               General bed mobility comments: pt sitting in window seat area on arrival    Transfers Overall transfer level: Needs assistance Equipment used: Rolling walker (2 wheeled) Transfers: Sit to/from Stand Sit to Stand: Supervision            Ambulation/Gait Ambulation/Gait assistance: Supervision Gait Distance (Feet): 250 Feet Assistive device: Rolling walker (2 wheeled) Gait Pattern/deviations: Step-to pattern;Decreased stance time - right;Antalgic     General Gait Details: verbal cues for foot placement and neutral alignment (pt reports knee has been rolling inwards)  Stairs Stairs: Yes Stairs  assistance: Supervision Stair Management: Step to pattern;Forwards;Two rails Number of Stairs: 3 General stair comments: verbal cues for sequence  Wheelchair Mobility    Modified Rankin (Stroke Patients Only)       Balance                                             Pertinent Vitals/Pain Pain Assessment: 0-10 Pain Score: 2  Pain Location: right hip Pain Descriptors / Indicators: Sore Pain Intervention(s): Repositioned;Monitored during session    Home Living Family/patient expects to be discharged to:: Private residence Living Arrangements: Spouse/significant other;Children;Other relatives Available Help at Discharge: Family;Available 24 hours/day Type of Home: House Home Access: Stairs to enter Entrance Stairs-Rails: Right;Left;Can reach both Entrance Stairs-Number of Steps: 6 Home Layout: One level Home Equipment: Walker - 4 wheels      Prior Function Level of Independence: Independent               Hand Dominance        Extremity/Trunk Assessment        Lower Extremity Assessment Lower Extremity Assessment: RLE deficits/detail RLE Deficits / Details: grossly 2+/5 hip strength       Communication   Communication: No difficulties  Cognition Arousal/Alertness: Awake/alert Behavior During Therapy: WFL for tasks assessed/performed Overall Cognitive Status: Within Functional Limits for tasks assessed  General Comments      Exercises Total Joint Exercises Ankle Circles/Pumps: AROM;Both;10 reps Quad Sets: AROM;10 reps;Right Hip ABduction/ADduction: AROM;10 reps;Right;Standing;Supine Long Arc Quad: AROM;Right;10 reps;Seated Knee Flexion: AROM;Standing;Right;10 reps Marching in Standing: AROM;Right;Standing;10 reps Standing Hip Extension: AROM;Right;10 reps;Standing   Assessment/Plan    PT Assessment Patent does not need any further PT services  PT Problem List          PT Treatment Interventions      PT Goals (Current goals can be found in the Care Plan section)  Acute Rehab PT Goals PT Goal Formulation: All assessment and education complete, DC therapy    Frequency     Barriers to discharge        Co-evaluation               AM-PAC PT "6 Clicks" Mobility  Outcome Measure Help needed turning from your back to your side while in a flat bed without using bedrails?: None Help needed moving from lying on your back to sitting on the side of a flat bed without using bedrails?: A Little Help needed moving to and from a bed to a chair (including a wheelchair)?: A Little Help needed standing up from a chair using your arms (e.g., wheelchair or bedside chair)?: A Little Help needed to walk in hospital room?: A Little Help needed climbing 3-5 steps with a railing? : A Little 6 Click Score: 19    End of Session Equipment Utilized During Treatment: Gait belt Activity Tolerance: Patient tolerated treatment well Patient left: with call bell/phone within reach;Other (comment) (returned to sitting on window seat)   PT Visit Diagnosis: Other abnormalities of gait and mobility (R26.89)    Time: 8115-7262 PT Time Calculation (min) (ACUTE ONLY): 19 min   Charges:   PT Evaluation $PT Eval Low Complexity: 1 Low         Kati PT, DPT Acute Rehabilitation Services Pager: [phone] Office: [phone]  Maida Sale E 01/02/2021, 1:01 PM

## 2021-01-02 NOTE — TOC Initial Note (Signed)
Transition of Care [facility]) - Initial/Assessment Note   Patient Details  Name: Tiffany Leonard MRN: [id number] Date of Birth: [birth year]  Transition of [facility]) CM/SW Contact:    Sherie Don, LCSW Phone Number: 01/02/2021, 10:30 AM  Clinical Narrative: Patient is a 35 year old female who was admitted for septic arthritis of hip. CSW met with patient to complete assessment. Per patient, she already has a rollator and cane at home so there are no DME needs at this time. PT to see patient. TOC awaiting recommendations.  Expected Discharge Plan: Home/Self Care Barriers to Discharge: Continued Medical Work up  Patient Goals and CMS Choice Patient states their goals for this hospitalization and ongoing recovery are:: Return home Choice offered to / list presented to : NA  Expected Discharge Plan and Services Expected Discharge Plan: Home/Self Care In-house Referral: Clinical Social Work Post Acute Care Choice: NA Living arrangements for the past 2 months: Single Family Home              DME Arranged: N/A DME Agency: NA  Prior Living Arrangements/Services Living arrangements for the past 2 months: Single Family Home Lives with:: Spouse Patient language and need for interpreter reviewed:: Yes Do you feel safe going back to the place where you live?: Yes      Need for Family Participation in Patient Care: No (Comment) Care giver support system in place?: Yes (comment) Current home services: DME (Rollator, cane) Criminal Activity/Legal Involvement Pertinent to Current Situation/Hospitalization: No - Comment as needed  Activities of Daily Living Home Assistive Devices/Equipment: Walker (specify type),Cane (specify quad or straight),Built-in shower seat ADL Screening (condition at time of admission) Patient's cognitive ability adequate to safely complete daily activities?: Yes Is the patient deaf or have difficulty hearing?: No Does the patient have difficulty seeing, even  when wearing glasses/contacts?: No Does the patient have difficulty concentrating, remembering, or making decisions?: No Patient able to express need for assistance with ADLs?: Yes Does the patient have difficulty dressing or bathing?: No Independently performs ADLs?: Yes (appropriate for developmental age) Does the patient have difficulty walking or climbing stairs?: Yes Weakness of Legs: Right Weakness of Arms/Hands: None  Emotional Assessment Appearance:: Appears stated age Attitude/Demeanor/Rapport: Engaged Affect (typically observed): Accepting Orientation: : Oriented to Self,Oriented to Place,Oriented to  Time,Oriented to Situation Alcohol / Substance Use: Not Applicable Psych Involvement: No (comment)  Admission diagnosis:  Septic arthritis of hip ([place]) [M00.9] Patient Active Problem List   Diagnosis Date Noted  . Medication monitoring encounter 09/23/2020  . Septic arthritis of hip ([place]) 09/11/2020  . Pregnancy 12/16/2019  . Encounter for supervision of normal first pregnancy in third trimester 12/15/2019  . Active labor 07/11/2015   PCP:  Patient, No Pcp Per (Inactive) Pharmacy:   [id number] [id number]: [phone]: [phone] Readmission Risk Interventions Readmission Risk Prevention Plan 09/12/2020  Post Dischage Appt Complete  Medication Screening Complete  Transportation Screening Complete  Some recent data might be hidden

## 2021-01-02 NOTE — Op Note (Signed)
NAME: Tiffany Leonard, Tiffany Leonard MEDICAL RECORD NO: [id number] ACCOUNT NO: [id number] DATE OF BIRTH: 1985-08-28 FACILITY: [facility]ATION: [id number] PHYSICIAN: Gus Rankin. Kamarah Bilotta, MD  Operative Report   DATE OF PROCEDURE: 01/01/2021  PREOPERATIVE DIAGNOSIS:  Septic arthritis, right hip.  POSTOPERATIVE DIAGNOSIS:  Septic arthritis, right hip.  PROCEDURE:  Right hip resection arthroplasty with antibiotic spacer placement.  SURGEON:  Ollen Gross, MD  ASSISTANT:  Leilani Able, PA-C  ANESTHESIA:  General.  ESTIMATED BLOOD LOSS:  700 mL  DRAIN: None.  COMPLICATIONS:  None.  CONDITION:  Stable to recovery.  BRIEF CLINICAL NOTE:  The patient is a 35 year old female who developed a right hip septic arthritis in close proximity to an intra-articular hip injection.  She was treated with irrigation and debridement with an open arthrotomy and then followed up  with IV antibiotics.  She did well for approximately 4 months and then started having increasing right hip pain last week.  Inflammatory markers were greatly elevated and an MRI showed septic changes in the hip joint and proximal femur and acetabulum.   She presents now for resection arthroplasty and placement of an antibiotic-impregnated spacer.  DESCRIPTION OF PROCEDURE:  After successful administration of general anesthetic, the patient was placed on the [place] bed with the boots clamped into the bed.  Right lower extremity was isolated from the perineum with drapes and prepped and draped in the  usual sterile fashion.  We utilized her previous anterior incision and skin cut with a 10 blade through subcutaneous tissue to the level of the fascia overlying the tensor fascia lata, which was incised and then the interval between the tensor fascia  lata and rectus is identified.  There  was a lot of scarring in this area, but I was able to develop that interval and get down to the hip capsule.  Capsulotomy was performed.  There was some fluid there,  which was sent for Gram stain, culture and  sensitivity, aerobic and anaerobic cultures.  The capsule was excised as it was involved with the infection.  The retractors were placed around the femoral neck and an osteotomy was made with an oscillating saw.  The femoral head was removed and shown to  be eroded superiorly from the infection.  Acetabular retractors were placed and there was a tremendous amount of tissue present on the acetabular rim and in the acetabulum, which was removed and debrided.  I then reamed the acetabulum starting at 43 mm going up to 47 mm.  We thoroughly irrigated  the acetabulum and placed a sponge in to dry it.  I mixed one batch of gentamicin-impregnated cement and placed in the acetabulum and we cemented the Prostalac all polyethylene acetabular component, which is a 42.  It is placed in about 45 degrees of  abduction and 20 degrees of anteversion, matching her native anatomy.  An impactor was placed and it was held in position until the cement hardened.  All extruded cement was removed.  The cup was found to be stable.  We then addressed the femur.  The femoral lift was placed.  Leg externally rotated to 100 degrees and then placed into the appropriate position for preparation of the femur.  We then used a broach for the [place] cemented stem just up to a size 1.  The  size 1 stem is opened.  We mixed another batch of antibiotic-impregnated cement and I placed it around the stem to coat the stem and then we placed some proximally in the canal  of the femur and then impacted the stem and matching the native anteversion.   When the cement fully hardened, then we did a trial with a 32+1 head.  It needed a little more tension, so went to 32+5, which restored her appropriate leg lengths and appropriate tension.  The permanent 32+5 metal head was placed and hips reduced and  snapped into position into the locking constrained liner.  The x-ray shows the components in good position  and shows the leg lengths to be equal.  The wound was then copiously irrigated with saline solution with pulsatile lavage.  A 30 mL of 0.25%  Marcaine were injected into the subcutaneous tissues.  The fascia over the tensor fascia lata was closed with a running 0 Stratafix suture.  Subcutaneous was closed with interrupted 2-0 Vicryl, subcuticular running 4-0 Monocryl.  Incision was cleaned and  dried and a sterile dressing applied.  She was subsequently awakened and transported to recovery in stable condition.     PAA D: 01/01/2021 7:58:10 pm T: 01/02/2021 7:50:00 am  JOB: [id number]/ [id number]

## 2021-01-03 ENCOUNTER — Other Ambulatory Visit (HOSPITAL_COMMUNITY): Payer: Self-pay

## 2021-01-03 DIAGNOSIS — M009 Pyogenic arthritis, unspecified: Secondary | ICD-10-CM | POA: Diagnosis not present

## 2021-01-03 DIAGNOSIS — A327 Listerial sepsis: Secondary | ICD-10-CM | POA: Diagnosis not present

## 2021-01-03 LAB — CBC
HCT: 31.5 % — ABNORMAL LOW (ref 36.0–46.0)
Hemoglobin: 10.2 g/dL — ABNORMAL LOW (ref 12.0–15.0)
MCH: 27.5 pg (ref 26.0–34.0)
MCHC: 32.4 g/dL (ref 30.0–36.0)
MCV: 84.9 fL (ref 80.0–100.0)
Platelets: 340 10*3/uL (ref 150–400)
RBC: 3.71 MIL/uL — ABNORMAL LOW (ref 3.87–5.11)
RDW: 14.6 % (ref 11.5–15.5)
WBC: 9.2 10*3/uL (ref 4.0–10.5)
nRBC: 0 % (ref 0.0–0.2)

## 2021-01-03 LAB — BASIC METABOLIC PANEL
Anion gap: 7 (ref 5–15)
BUN: 8 mg/dL (ref 6–20)
CO2: 29 mmol/L (ref 22–32)
Calcium: 9.1 mg/dL (ref 8.9–10.3)
Chloride: 101 mmol/L (ref 98–111)
Creatinine, Ser: 0.57 mg/dL (ref 0.44–1.00)
GFR, Estimated: >60 mL/min (ref >60)
Glucose, Bld: 133 mg/dL — ABNORMAL HIGH (ref 70–99)
Potassium: 4.2 mmol/L (ref 3.5–5.1)
Sodium: 137 mmol/L (ref 135–145)

## 2021-01-03 MED ORDER — CEFAZOLIN IV (FOR PTA / DISCHARGE USE ONLY): 2.0000 g | Freq: Three times a day (TID) | INTRAVENOUS | 0 refills | Status: DC

## 2021-01-03 MED ORDER — HYDROCODONE-ACETAMINOPHEN 5-325 MG PO TABS: 1.0000 | ORAL_TABLET | Freq: Four times a day (QID) | ORAL | 0 refills | Status: DC | PRN

## 2021-01-03 MED ORDER — ASPIRIN 325 MG PO TBEC: 325.0000 mg | DELAYED_RELEASE_TABLET | Freq: Two times a day (BID) | ORAL | 0 refills | Status: AC

## 2021-01-03 MED ORDER — METHOCARBAMOL 500 MG PO TABS: 500.0000 mg | ORAL_TABLET | Freq: Four times a day (QID) | ORAL | 0 refills | Status: DC | PRN

## 2021-01-03 MED ORDER — HEPARIN SOD (PORK) LOCK FLUSH 100 UNIT/ML IV SOLN
250.0000 [IU] | INTRAVENOUS | Status: AC | PRN
  Administered 2021-01-03: 250 [IU]
  Filled 2021-01-03: qty 2.5

## 2021-01-03 NOTE — Progress Notes (Signed)
[place] for Infectious Disease   Reason for visit: Follow up on septic arthritis  Interval History: picc in place, discharging today  Physical Exam: Constitutional:  Vitals:   01/02/21 2211 01/03/21 0538  BP: 135/79 117/62  Pulse: (!) 57 (!) 54  Resp: 16 16  Temp: 98.6 F (37 C) 97.6 F (36.4 C)  SpO2: 100% 99%   patient appears in NAD  Impression: native septic arthritis, s/p resection arthroplasty of native joint/bones and spacer placement.   Previous culture with MSSA, culture now ngtd.   Plan: 1. 6 weeks of cefazoli  Diagnosis: Septic arthritis  Culture Result: previous MSSA, no growth to date now  Allergies  Allergen Reactions  . Banana Itching    Pt reports itching in mouth and throat    OPAT Orders Discharge antibiotics to be given via PICC line Discharge antibiotics: cefazolin 2 grams every 8 hours Per pharmacy protocol yes Duration: 6 weeks End Date: June 27th, 2022  [place] Per Protocol: yes  Home health RN for IV administration and teaching; PICC line care and labs.    Labs weekly while on IV antibiotics: _x_ CBC with differential __ BMP _x_ CMP _x_ CRP _x_ ESR __ Vancomycin trough __ CK  __ Please pull PIC at completion of IV antibiotics _x_ Please leave PIC in place until doctor has seen patient or been notified  Fax weekly labs to [phone]  Clinic Follow Up Appt: January 22, 2021  _0  am with Dr. West Bali

## 2021-01-03 NOTE — Progress Notes (Signed)
   Subjective: 2 Days Post-Op Procedure(s) (LRB): Right hip resection arthroplasty with antibiotic spacer (Right) Patient reports pain as mild.   Patient seen in rounds by Dr. Lequita Halt. Patient is well, and has had no acute complaints or problems. States she is ready to go home. Plan is to go Home after hospital stay.  Objective: Vital signs in last 24 hours: Temp:  [97.6 F (36.4 C)-98.6 F (37 C)] 97.6 F (36.4 C) (05/18 0538) Pulse Rate:  [54-59] 54 (05/18 0538) Resp:  [16-18] 16 (05/18 0538) BP: (117-148)/(62-86) 117/62 (05/18 0538) SpO2:  [99 %-100 %] 99 % (05/18 0538)  Intake/Output from previous day:  Intake/Output Summary (Last 24 hours) at 01/03/2021 0745 Last data filed at 01/03/2021 0556 Gross per 24 hour  Intake 2632.5 ml  Output --  Net 2632.5 ml    Intake/Output this shift: No intake/output data recorded.  Labs: Recent Labs    01/01/21 1510 01/02/21 0336 01/03/21 0333  HGB 12.8 10.7* 10.2*   Recent Labs    01/02/21 0336 01/03/21 0333  WBC 10.8* 9.2  RBC 3.92 3.71*  HCT 34.1* 31.5*  PLT 352 340   Recent Labs    01/02/21 0336 01/03/21 0333  NA 134* 137  K 3.9 4.2  CL 101 101  CO2 26 29  BUN 8 8  CREATININE 0.69 0.57  GLUCOSE 158* 133*  CALCIUM 8.7* 9.1   Recent Labs    01/01/21 1510  INR 1.1    Exam: General - Patient is Alert and Oriented Extremity - Neurologically intact Neurovascular intact Sensation intact distally Dorsiflexion/Plantar flexion intact Dressing/Incision - clean, dry, no drainage Motor Function - intact, moving foot and toes well on exam.   Past Medical History:  Diagnosis Date  . Anxiety   . Arthritis     Assessment/Plan: 2 Days Post-Op Procedure(s) (LRB): Right hip resection arthroplasty with antibiotic spacer (Right) Active Problems:   Septic arthritis of hip (HCC)  Estimated body mass index is 25.11 kg/m as calculated from the following:   Height as of this encounter: 5\' 10"  (1.778 m).   Weight  as of this encounter: 79.4 kg.  DVT Prophylaxis - Aspirin Weight-bearing as tolerated  PICC line placed last night.  Per ID recs, patient will require cefazolin x 6 weeks. Very appreciative of ID's assistance with this patient.  Ready for discharge, follow-up in the office in 2 weeks.   The PDMP database was reviewed today prior to any opioid medications being prescribed to this patient.  , PA-C Orthopedic Surgery [phone] 01/03/2021, 7:45 AM

## 2021-01-03 NOTE — Progress Notes (Signed)
PHARMACY CONSULT NOTE FOR:  OUTPATIENT  PARENTERAL ANTIBIOTIC THERAPY (OPAT)  Indication: septic arthritis/osteomyelitis  Regimen: cefazolin 2g IV q8h End date: 02/12/2021  IV antibiotic discharge orders are pended. To discharging provider:  please sign these orders via discharge navigator,  Select New Orders & click on the button choice - Manage This Unsigned Work.     Thank you for allowing pharmacy to be a part of this patient's care.  Tiffany Leonard 01/03/2021, 12:17 PM

## 2021-01-03 NOTE — TOC Transition Note (Signed)
Transition of Care [facility]) - CM/SW Discharge Note  Patient Details  Name: Tiffany Leonard MRN: [id number] Date of Birth: 07-22-1986  Transition of Care [facility]) CM/SW Contact:  Ewing Schlein, LCSW Phone Number: 01/03/2021, 12:54 PM  Clinical Narrative: Patient has a PICC line and will need to discharge home with several weeks of antibiotics, so she will need [facility] and a referral for IV/infusions. CSW spoke with patient, who is agreeable to referral for IV antibiotics through [facility]. Patient has previously used this provider. CSW made referral to [facility] with [facility]. Pam to assist with setting up [facility] through [facility], which patient has also previously used. OPat orders are in. Patient expected to discharge after receiving her afternoon dose. TOC signing off.  Final next level of care: Home w Home Health Services Barriers to Discharge: Barriers Resolved  Patient Goals and CMS Choice Patient states their goals for this hospitalization and ongoing recovery are:: Return home CMS Medicare.gov Compare Post Acute Care list provided to:: Patient Choice offered to / list presented to : Patient  Discharge Plan and Services In-house Referral: Clinical Social Work Post Acute Care Choice: NA          DME Arranged: N/A DME Agency: NA HH Arranged: RN HH Agency: [facility] Date HH Agency Contacted: 01/03/21 Representative spoke with at [facility] Agency: Pam  Readmission Risk Interventions Readmission Risk Prevention Plan 09/12/2020  Post Dischage Appt Complete  Medication Screening Complete  Transportation Screening Complete  Some recent data might be hidden

## 2021-01-04 ENCOUNTER — Encounter (HOSPITAL_COMMUNITY): Payer: Self-pay | Admitting: Orthopedic Surgery

## 2021-01-05 ENCOUNTER — Other Ambulatory Visit (HOSPITAL_COMMUNITY): Payer: Self-pay

## 2021-01-07 LAB — AEROBIC/ANAEROBIC CULTURE W GRAM STAIN (SURGICAL/DEEP WOUND)
Culture: NO GROWTH
Gram Stain: NONE SEEN

## 2021-01-08 NOTE — Discharge Summary (Signed)
Physician Discharge Summary   Patient ID: Tiffany Leonard MRN: [id number] DOB/AGE: 12/27/1985 35 y.o.  Admit date: 01/01/2021 Discharge date: 01/03/2021  Primary Diagnosis: Septic arthritis, right hip   Admission Diagnoses:  Past Medical History:  Diagnosis Date  . Anxiety   . Arthritis    Discharge Diagnoses:   Active Problems:   Septic arthritis of hip (HCC)  Estimated body mass index is 25.11 kg/m as calculated from the following:   Height as of this encounter: '5\' 10"'  (1.778 m).   Weight as of this encounter: 79.4 kg.  Procedure:  Procedure(s) (LRB): Right hip resection arthroplasty with antibiotic spacer (Right)   Consults: ID  HPI: The patient is a 35 year old female who developed a right hip septic arthritis in close proximity to an intra-articular hip injection.  She was treated with irrigation and debridement with an open arthrotomy and then followed up with IV antibiotics.  She did well for approximately 4 months and then started having increasing right hip pain last week.  Inflammatory markers were greatly elevated and an MRI showed septic changes in the hip joint and proximal femur and acetabulum. She presents now for resection arthroplasty and placement of an antibiotic-impregnated spacer.  Laboratory Data: Admission on 01/01/2021, Discharged on 01/03/2021  Component Date Value Ref Range Status  . WBC 01/01/2021 8.0  4.0 - 10.5 K/uL Final  . RBC 01/01/2021 4.72  3.87 - 5.11 MIL/uL Final  . Hemoglobin 01/01/2021 12.8  12.0 - 15.0 g/dL Final  . HCT 01/01/2021 39.7  36.0 - 46.0 % Final  . MCV 01/01/2021 84.1  80.0 - 100.0 fL Final  . MCH 01/01/2021 27.1  26.0 - 34.0 pg Final  . MCHC 01/01/2021 32.2  30.0 - 36.0 g/dL Final  . RDW 01/01/2021 14.6  11.5 - 15.5 % Final  . Platelets 01/01/2021 360  150 - 400 K/uL Final  . nRBC 01/01/2021 0.0  0.0 - 0.2 % Final   Performed at [place] [address] [place], [place] [address]  .  Sodium 01/01/2021 138  135 - 145 mmol/L Final  . Potassium 01/01/2021 3.9  3.5 - 5.1 mmol/L Final  . Chloride 01/01/2021 101  98 - 111 mmol/L Final  . CO2 01/01/2021 28  22 - 32 mmol/L Final  . Glucose, Bld 01/01/2021 84  70 - 99 mg/dL Final   Glucose reference range applies only to samples taken after fasting for at least 8 hours.  . BUN 01/01/2021 11  6 - 20 mg/dL Final  . Creatinine, Ser 01/01/2021 0.64  0.44 - 1.00 mg/dL Final  . Calcium 01/01/2021 9.5  8.9 - 10.3 mg/dL Final  . Total Protein 01/01/2021 7.6  6.5 - 8.1 g/dL Final  . Albumin 01/01/2021 3.6  3.5 - 5.0 g/dL Final  . AST 01/01/2021 20  15 - 41 U/L Final  . ALT 01/01/2021 32  0 - 44 U/L Final  . Alkaline Phosphatase 01/01/2021 83  38 - 126 U/L Final  . Total Bilirubin 01/01/2021 <0.1* 0.3 - 1.2 mg/dL Final  . GFR, Estimated 01/01/2021 >60  >60 mL/min Final   Comment: (NOTE) Calculated using the CKD-EPI Creatinine Equation (2021)   . Anion gap 01/01/2021 9  5 - 15 Final   Performed at [facility], [place] [address] [place], [place] [address]  . Prothrombin Time 01/01/2021 14.4  11.4 - 15.2 seconds Final  . INR 01/01/2021 1.1  0.8 - 1.2 Final   Comment: (NOTE) INR goal  varies based on device and disease states. Performed at [facility], [place] [address] [place], [place] [address]   . ABO/RH(D) 01/01/2021 AB NEG   Final  . Antibody Screen 01/01/2021 NEG   Final  . Sample Expiration 01/01/2021    Final                   Value:01/04/2021,2359 Performed at [facility], [place] [address] [place], [place] [address]   . Preg Test, Ur 01/01/2021 NEGATIVE  NEGATIVE Final   Comment:        THE SENSITIVITY OF THIS METHODOLOGY IS >20 mIU/mL. Performed at [facility], [place] [address] [place], [place] [address]   . MRSA, PCR 01/01/2021 NEGATIVE  NEGATIVE Final  . Staphylococcus aureus 01/01/2021 NEGATIVE  NEGATIVE Final   Comment: (NOTE) The Xpert SA  Assay (FDA approved for NASAL specimens in patients 47 years of age and older), is one component of a comprehensive surveillance program. It is not intended to diagnose infection nor to guide or monitor treatment. Performed at [facility], [place] [address], [place], [place] [address]   . Specimen Description 01/01/2021    Final                   Value:HIP RIGHT Performed at [facility], [place] [address], [place], [place] [address]   . Special Requests 01/01/2021    Final                   Value:NONE Performed at [facility], [place] [address], [place], [place] [address]   . Gram Stain 01/01/2021    Final                   Value:NO WBC SEEN NO ORGANISMS SEEN   . Culture 01/01/2021    Final                   Value:No growth aerobically or anaerobically. Performed at [facility], [place] [address], [place], [place] [address]   . Report Status 01/01/2021 01/07/2021 FINAL   Final  . AFB Specimen Processing 01/01/2021 Concentration   Final  . Acid Fast Smear 01/01/2021 Negative   Final   Comment: (NOTE) Performed At: [facility] [place], [place] [id number] Rush Farmer MD PJ:[id number]   . Source (AFB) 01/01/2021 HIP   Final   Comment: RIGHT Performed at [facility], [place] [address] [place], [place] [address]   . WBC 01/02/2021 10.8* 4.0 - 10.5 K/uL Final  . RBC 01/02/2021 3.92  3.87 - 5.11 MIL/uL Final  . Hemoglobin 01/02/2021 10.7* 12.0 - 15.0 g/dL Final  . HCT 01/02/2021 34.1* 36.0 - 46.0 % Final  . MCV 01/02/2021 87.0  80.0 - 100.0 fL Final  . MCH 01/02/2021 27.3  26.0 - 34.0 pg Final  . MCHC 01/02/2021 31.4  30.0 - 36.0 g/dL Final  . RDW 01/02/2021 14.6  11.5 - 15.5 % Final  . Platelets 01/02/2021 352  150 - 400 K/uL Final  . nRBC 01/02/2021 0.0  0.0 - 0.2 % Final   Performed at [facility], [place] [address], [place], [place] [address]  . Sodium  01/02/2021 134* 135 - 145 mmol/L Final  . Potassium 01/02/2021 3.9  3.5 - 5.1 mmol/L Final  . Chloride 01/02/2021 101  98 - 111 mmol/L Final  . CO2 01/02/2021 26  22 - 32 mmol/L Final  . Glucose, Bld 01/02/2021 158* 70 - 99 mg/dL Final   Glucose reference range applies only to samples taken after fasting for at least 8 hours.  . BUN 01/02/2021 8  6 - 20 mg/dL Final  . Creatinine, Ser 01/02/2021 0.69  0.44 - 1.00 mg/dL Final  . Calcium 01/02/2021 8.7* 8.9 - 10.3 mg/dL Final  . GFR, Estimated 01/02/2021 >60  >60 mL/min Final   Comment: (NOTE) Calculated using the CKD-EPI Creatinine Equation (2021)   . Anion gap 01/02/2021 7  5 - 15 Final   Performed at [facility], [place] [address], [place], [place] [address]  . WBC 01/03/2021 9.2  4.0 - 10.5 K/uL Final  . RBC 01/03/2021 3.71* 3.87 - 5.11 MIL/uL Final  . Hemoglobin 01/03/2021 10.2* 12.0 - 15.0 g/dL Final  . HCT 01/03/2021 31.5* 36.0 - 46.0 % Final  . MCV 01/03/2021 84.9  80.0 - 100.0 fL Final  . MCH 01/03/2021 27.5  26.0 - 34.0 pg Final  . MCHC 01/03/2021 32.4  30.0 - 36.0 g/dL Final  . RDW 01/03/2021 14.6  11.5 - 15.5 % Final  . Platelets 01/03/2021 340  150 - 400 K/uL Final  . nRBC 01/03/2021 0.0  0.0 - 0.2 % Final   Performed at [facility], [place] [address], [place], [place] [address]  . Sodium 01/03/2021 137  135 - 145 mmol/L Final  . Potassium 01/03/2021 4.2  3.5 - 5.1 mmol/L Final  . Chloride 01/03/2021 101  98 - 111 mmol/L Final  . CO2 01/03/2021 29  22 - 32 mmol/L Final  . Glucose, Bld 01/03/2021 133* 70 - 99 mg/dL Final   Glucose reference range applies only to samples taken after fasting for at least 8 hours.  . BUN 01/03/2021 8  6 - 20 mg/dL Final  . Creatinine, Ser 01/03/2021 0.57  0.44 - 1.00 mg/dL Final  . Calcium 01/03/2021 9.1  8.9 - 10.3 mg/dL Final  . GFR, Estimated 01/03/2021 >60  >60 mL/min Final   Comment: (NOTE) Calculated using the CKD-EPI Creatinine Equation (2021)   .  Anion gap 01/03/2021 7  5 - 15 Final   Performed at [facility], [place] [address], [place], [place] [address]  Hospital Outpatient Visit on 12/29/2020  Component Date Value Ref Range Status  . SARS Coronavirus 2 12/29/2020 NEGATIVE  NEGATIVE Final   Comment: (NOTE) SARS-CoV-2 target nucleic acids are NOT DETECTED.  The SARS-CoV-2 RNA is generally detectable in upper and lower respiratory specimens during the acute phase of infection. Negative results do not preclude SARS-CoV-2 infection, do not rule out co-infections with other pathogens, and should not be used as the sole basis for treatment or other patient management decisions. Negative results must be combined with clinical observations, patient history, and epidemiological information. The expected result is Negative.  Fact Sheet for Patients: SugarRoll.be  Fact Sheet for Healthcare Providers: https://www.woods-mathews.com/  This test is not yet approved or cleared by the [place] FDA and  has been authorized for detection and/or diagnosis of SARS-CoV-2 by FDA under an Emergency Use Authorization (EUA). This EUA will remain  in effect (meaning this test can be used) for the duration of the COVID-19 declaration under Se                          ction 564(b)(1) of the Act, 21 U.S.C. section 360bbb-3(b)(1), unless the authorization is terminated  or revoked sooner.  Performed at [facility], [place] [address] [place], [place] [address]      X-Rays:DG Pelvis Portable  Result Date: 01/01/2021 CLINICAL DATA:  Status post right arthroplasty EXAM: PORTABLE PELVIS 1-2 VIEWS COMPARISON:  Intraop films from earlier in the same day. FINDINGS: Fixation screws noted traversing the right sacroiliac joint into the sacrum. Right hip prosthesis is noted similar to that seen on intraoperative films. No acute fracture is noted. Old bilateral pubic rami fractures are noted. IUD  is noted in place. IMPRESSION: Status post right hip arthroplasty Electronically Signed   By: Inez Catalina M.D.   On: 01/01/2021 22:34   DG C-Arm 1-60 Min-No Report  Result Date: 01/01/2021 Fluoroscopy was utilized by the requesting physician.  No radiographic interpretation.   DG HIP OPERATIVE UNILAT W OR W/O PELVIS RIGHT  Result Date: 01/01/2021 CLINICAL DATA:  Right hip resection arthroplasty EXAM: OPERATIVE right HIP (WITH PELVIS IF PERFORMED) 5 VIEWS TECHNIQUE: Fluoroscopic spot image(s) were submitted for interpretation post-operatively. COMPARISON:  None. FINDINGS: Five spot images were obtained during right hip arthroplasty. Alignment is normal. No unexpected metallic foreign body on the final image. IMPRESSION: Fluoroscopic images during right hip arthroplasty. Electronically Signed   By: Ulyses Jarred M.D.   On: 01/01/2021 20:25   [place] EKG SITE RITE  Result Date: 01/02/2021 If Site Rite image not attached, placement could not be confirmed due to current cardiac rhythm.   EKG:No orders found for this or any previous visit.   Hospital Course: Joei Frangos is a 35 y.o. who was admitted to [facility]. They were brought to the operating room on 01/01/2021 and underwent Procedure(s): Right hip resection arthroplasty with antibiotic spacer.  Patient tolerated the procedure well and was later transferred to the recovery room and then to the orthopaedic floor for postoperative care. They were given PO and IV analgesics for pain control following their surgery. They were given 24 hours of postoperative antibiotics of  Anti-infectives (From admission, onward)   Start     Dose/Rate Route Frequency Ordered Stop   01/03/21 0000  ceFAZolin (ANCEF) IVPB        2 g Intravenous Every 8 hours 01/03/21 0743     01/02/21 1700  ceFAZolin (ANCEF) IVPB 2g/100 mL premix  Status:  Discontinued        2 g 200 mL/hr over 30 Minutes Intravenous Every 8 hours 01/02/21 1555 01/03/21 2302    01/02/21 0600  ceFAZolin (ANCEF) IVPB 2g/100 mL premix        2 g 200 mL/hr over 30 Minutes Intravenous On call to O.R. 01/01/21 1406 01/01/21 1817   01/02/21 0030  ceFAZolin (ANCEF) IVPB 2g/100 mL premix        2 g 200 mL/hr over 30 Minutes Intravenous Every 6 hours 01/01/21 2132 01/02/21 0712     and started on DVT prophylaxis in the form of Aspirin.   PT and OT were ordered for total joint protocol. Discharge planning consulted to help with postop disposition and equipment needs. Patient had a good night on the evening of surgery. Infectious disease was consulted for antibiotic management. They got up out of bed and ambulated with minimal pain on POD #1. PICC line was placed and infectious disease recommended 6 weeks of cefazolin. She was discharged later that day in stable condition.  Diet: Regular diet Activity: WBAT Follow-up: in 2 weeks Disposition: Home Discharged Condition: stable   Discharge Instructions    [facility] pharmacist to adjust dose for Vancomycin, Aminoglycosides and other anti-infective therapies as requested by physician.   Complete by: As directed    [facility] to provide Cath Flo 35m   Complete by: As directed    Administer for PICC line occlusion and as ordered by physician for other access device issues.   Anaphylaxis Kit: Provided to treat any anaphylactic reaction to the medication being provided to the patient if First Dose or when requested by physician   Complete by: As directed    Epinephrine 151mml vial / amp: Administer 0.67m52m0.67ml34mubcutaneously once for moderate to severe anaphylaxis, nurse to call physician and pharmacy when reaction occurs and call 911 if needed for immediate care   Diphenhydramine 50mg6mIV vial: Administer 25-50mg 26mM PRN for first dose reaction, rash, itching, mild reaction, nurse to call physician and pharmacy when reaction occurs   Sodium Chloride 0.9% NS 500ml I40mdminister if needed for  hypovolemic blood pressure drop or as ordered by physician after call to physician with anaphylactic reaction   Call MD / Call 911   Complete by: As directed    If you experience chest pain or shortness of breath, CALL 911 and be transported to the hospital emergency room.  If you develope a fever above 101 F, pus (white drainage) or increased drainage or redness at the wound, or calf pain, call your surgeon's office.   Change dressing   Complete by: As directed    You have an adhesive waterproof bandage over the incision. Leave this in place until your first follow-up appointment. Once you remove this you will not need to place another bandage.   Change dressing on IV access line weekly and PRN   Complete by: As directed    Constipation Prevention   Complete by: As directed    Drink plenty of fluids.  Prune juice may be helpful.  You may use a stool softener, such as Colace (over the counter) 100 mg twice a day.  Use MiraLax (over the counter) for constipation as needed.   Diet - low sodium heart healthy   Complete by: As directed    Do not sit on low chairs, stoools or toilet seats, as it may be difficult to get up from low surfaces   Complete by: As directed    Driving restrictions   Complete by: As directed    No driving for two weeks   Flush IV access with Sodium Chloride 0.9% and Heparin 10 units/ml or 100 units/ml   Complete by: As directed    Home infusion instructions - [facility]   Complete by: As directed    Instructions: Flush IV access with Sodium Chloride 0.9% and Heparin 10units/ml or 100units/ml   Change dressing on IV access line: Weekly and PRN   Instructions Cath Flo 2mg: Ad25mister for PICC Line occlusion and as ordered by physician for other access device   [facility] pharmacist to adjust dose for: Vancomycin, Aminoglycosides and other anti-infective therapies as requested by physician   Method of administration may be changed at the discretion  of home infusion pharmacist based upon assessment of the patient and/or caregiver's ability to self-administer the medication ordered   Complete by: As directed    Post-operative opioid taper instructions:   Complete by: As directed    POST-OPERATIVE OPIOID TAPER INSTRUCTIONS: It is important to wean off of your opioid medication as soon as possible. If you do not need pain medication after  your surgery it is ok to stop day one. Opioids include: Codeine, Hydrocodone(Norco, Vicodin), Oxycodone(Percocet, oxycontin) and hydromorphone amongst others.  Long term and even short term use of opiods can cause: Increased pain response Dependence Constipation Depression Respiratory depression And more.  Withdrawal symptoms can include Flu like symptoms Nausea, vomiting And more Techniques to manage these symptoms Hydrate well Eat regular healthy meals Stay active Use relaxation techniques(deep breathing, meditating, yoga) Do Not substitute Alcohol to help with tapering If you have been on opioids for less than two weeks and do not have pain than it is ok to stop all together.  Plan to wean off of opioids This plan should start within one week post op of your joint replacement. Maintain the same interval or time between taking each dose and first decrease the dose.  Cut the total daily intake of opioids by one tablet each day Next start to increase the time between doses. The last dose that should be eliminated is the evening dose.      TED hose   Complete by: As directed    Use stockings (TED hose) for three weeks on both leg(s).  You may remove them at night for sleeping.   Weight bearing as tolerated   Complete by: As directed      Allergies as of 01/03/2021      Reactions   Banana Itching   Pt reports itching in mouth and throat      Medication List    TAKE these medications   ALPRAZolam 0.25 MG tablet Commonly known as: XANAX Take 0.25 mg by mouth at bedtime as needed for  anxiety.   aspirin 325 MG EC tablet Take 1 tablet (325 mg total) by mouth 2 (two) times daily for 19 days. Then take one 81 mg aspirin once a day for three weeks. Then discontinue aspirin.   ceFAZolin  IVPB Commonly known as: ANCEF Inject 2 g into the vein every 8 (eight) hours. Indication:  Septic arthritis and osteomyelitis First Dose: Yes Last Day of Therapy:  02/12/21 Labs - Once weekly:  CBC/D and BMP, Labs - Every other week:  ESR and CRP Method of administration: IV Push Method of administration may be changed at the discretion of home infusion pharmacist based upon assessment of the patient and/or caregiver's ability to self-administer the medication ordered.   HYDROcodone-acetaminophen 5-325 MG tablet Commonly known as: NORCO/VICODIN Take 1-2 tablets by mouth every 6 (six) hours as needed for severe pain. What changed:   how much to take  when to take this  reasons to take this   methocarbamol 500 MG tablet Commonly known as: ROBAXIN Take 1 tablet (500 mg total) by mouth every 6 (six) hours as needed for muscle spasms. What changed: when to take this   Mirena (52 MG) 20 MCG/DAY Iud Generic drug: levonorgestrel 1 each by Intrauterine route once.   sertraline 100 MG tablet Commonly known as: ZOLOFT Take 100 mg by mouth daily.            Discharge Care Instructions  (From admission, onward)         Start     Ordered   01/03/21 0000  Change dressing on IV access line weekly and PRN  (Home infusion instructions - [facility] )        01/03/21 0743   01/03/21 0000  Weight bearing as tolerated        01/03/21 0743   01/03/21 0000  Change dressing  Comments: You have an adhesive waterproof bandage over the incision. Leave this in place until your first follow-up appointment. Once you remove this you will not need to place another bandage.   01/03/21 0743          Follow-up Information    Gaynelle Arabian, MD. Schedule an appointment as soon  as possible for a visit in 2 week(s).   Specialty: Orthopedic Surgery Contact information: [address] [place] [address] [phone]        [facility] Follow up.   Why: 6 weeks of IV antibiotics              Signed: Theresa Duty, PA-C Orthopedic Surgery 01/08/2021, 7:35 AM

## 2021-01-10 DIAGNOSIS — Z3490 Encounter for supervision of normal pregnancy, unspecified, unspecified trimester: Secondary | ICD-10-CM | POA: Diagnosis not present

## 2021-01-10 DIAGNOSIS — M009 Pyogenic arthritis, unspecified: Secondary | ICD-10-CM | POA: Diagnosis not present

## 2021-01-10 DIAGNOSIS — A327 Listerial sepsis: Secondary | ICD-10-CM | POA: Diagnosis not present

## 2021-01-10 DIAGNOSIS — Z5181 Encounter for therapeutic drug level monitoring: Secondary | ICD-10-CM | POA: Diagnosis not present

## 2021-01-11 DIAGNOSIS — M009 Pyogenic arthritis, unspecified: Secondary | ICD-10-CM | POA: Diagnosis not present

## 2021-01-11 DIAGNOSIS — A327 Listerial sepsis: Secondary | ICD-10-CM | POA: Diagnosis not present

## 2021-01-16 DIAGNOSIS — M009 Pyogenic arthritis, unspecified: Secondary | ICD-10-CM | POA: Diagnosis not present

## 2021-01-16 DIAGNOSIS — A327 Listerial sepsis: Secondary | ICD-10-CM | POA: Diagnosis not present

## 2021-01-16 DIAGNOSIS — Z5181 Encounter for therapeutic drug level monitoring: Secondary | ICD-10-CM | POA: Diagnosis not present

## 2021-01-18 DIAGNOSIS — M009 Pyogenic arthritis, unspecified: Secondary | ICD-10-CM | POA: Diagnosis not present

## 2021-01-18 DIAGNOSIS — A327 Listerial sepsis: Secondary | ICD-10-CM | POA: Diagnosis not present

## 2021-01-21 DIAGNOSIS — M009 Pyogenic arthritis, unspecified: Secondary | ICD-10-CM | POA: Diagnosis not present

## 2021-01-21 DIAGNOSIS — Z5181 Encounter for therapeutic drug level monitoring: Secondary | ICD-10-CM | POA: Diagnosis not present

## 2021-01-21 DIAGNOSIS — A327 Listerial sepsis: Secondary | ICD-10-CM | POA: Diagnosis not present

## 2021-01-23 ENCOUNTER — Ambulatory Visit: Payer: BLUE CROSS/BLUE SHIELD | Admitting: Infectious Diseases

## 2021-01-26 DIAGNOSIS — A327 Listerial sepsis: Secondary | ICD-10-CM | POA: Diagnosis not present

## 2021-01-26 DIAGNOSIS — M009 Pyogenic arthritis, unspecified: Secondary | ICD-10-CM | POA: Diagnosis not present

## 2021-01-29 DIAGNOSIS — M009 Pyogenic arthritis, unspecified: Secondary | ICD-10-CM | POA: Diagnosis not present

## 2021-01-29 DIAGNOSIS — A327 Listerial sepsis: Secondary | ICD-10-CM | POA: Diagnosis not present

## 2021-01-30 DIAGNOSIS — Z5181 Encounter for therapeutic drug level monitoring: Secondary | ICD-10-CM | POA: Diagnosis not present

## 2021-01-30 DIAGNOSIS — M009 Pyogenic arthritis, unspecified: Secondary | ICD-10-CM | POA: Diagnosis not present

## 2021-01-30 DIAGNOSIS — A327 Listerial sepsis: Secondary | ICD-10-CM | POA: Diagnosis not present

## 2021-01-30 DIAGNOSIS — Z3490 Encounter for supervision of normal pregnancy, unspecified, unspecified trimester: Secondary | ICD-10-CM | POA: Diagnosis not present

## 2021-02-05 DIAGNOSIS — M009 Pyogenic arthritis, unspecified: Secondary | ICD-10-CM | POA: Diagnosis not present

## 2021-02-05 DIAGNOSIS — A327 Listerial sepsis: Secondary | ICD-10-CM | POA: Diagnosis not present

## 2021-02-05 DIAGNOSIS — Z4789 Encounter for other orthopedic aftercare: Secondary | ICD-10-CM | POA: Diagnosis not present

## 2021-02-09 DIAGNOSIS — Z5181 Encounter for therapeutic drug level monitoring: Secondary | ICD-10-CM | POA: Diagnosis not present

## 2021-02-09 DIAGNOSIS — A327 Listerial sepsis: Secondary | ICD-10-CM | POA: Diagnosis not present

## 2021-02-09 DIAGNOSIS — M009 Pyogenic arthritis, unspecified: Secondary | ICD-10-CM | POA: Diagnosis not present

## 2021-02-12 ENCOUNTER — Telehealth: Payer: Self-pay

## 2021-02-12 DIAGNOSIS — M009 Pyogenic arthritis, unspecified: Secondary | ICD-10-CM | POA: Diagnosis not present

## 2021-02-12 DIAGNOSIS — A327 Listerial sepsis: Secondary | ICD-10-CM | POA: Diagnosis not present

## 2021-02-12 NOTE — Telephone Encounter (Signed)
Patient had called to check on IV picc 3 x a day finishing on 02/13/21, needs to know if picc should be removed at office or by home health nurse. Patient had missed appointment with Dr. Elinor Parkinson and has rescheduled for 02/14/21 at 9:30 with Dr. Earlene Plater, appointment ok by Dr. Earlene Plater

## 2021-02-14 ENCOUNTER — Other Ambulatory Visit: Payer: Self-pay

## 2021-02-14 ENCOUNTER — Ambulatory Visit (INDEPENDENT_AMBULATORY_CARE_PROVIDER_SITE_OTHER): Payer: BLUE CROSS/BLUE SHIELD | Admitting: Internal Medicine

## 2021-02-14 ENCOUNTER — Encounter: Payer: Self-pay | Admitting: Internal Medicine

## 2021-02-14 ENCOUNTER — Telehealth: Payer: Self-pay

## 2021-02-14 VITALS — BP 126/85 | HR 88 | Temp 97.7°F | Wt 167.0 lb

## 2021-02-14 DIAGNOSIS — M869 Osteomyelitis, unspecified: Secondary | ICD-10-CM | POA: Insufficient documentation

## 2021-02-14 DIAGNOSIS — Z5181 Encounter for therapeutic drug level monitoring: Secondary | ICD-10-CM

## 2021-02-14 DIAGNOSIS — M00051 Staphylococcal arthritis, right hip: Secondary | ICD-10-CM | POA: Diagnosis not present

## 2021-02-14 HISTORY — DX: Osteomyelitis, unspecified: M86.9

## 2021-02-14 MED ORDER — CEFADROXIL 500 MG PO CAPS: 500.0000 mg | ORAL_CAPSULE | Freq: Two times a day (BID) | ORAL | 2 refills | Status: AC

## 2021-02-14 NOTE — Telephone Encounter (Signed)
Mary with [facility] notified of PICC removal today in office. IV abx completed.   Johnna Bollier Loyola Mast, RN

## 2021-02-14 NOTE — Assessment & Plan Note (Signed)
She has completed 6 weeks of cefazolin for recurrent MSSA right hip native joint septic arthritis and osteomyelitis s/p resection arthroplasty with antibiotic spacer on 01/01/21 with Dr Lequita Halt.  Her PICC line remains in place and will remove today.  Her inflammatory markers have normalized and she follows up with orthopedics in about 1 week to discuss next steps.  She reports placement of a prosthetic joint will likely be deferred for about 6 months. She has no signs or symptoms of infection currently and her pain is much improved.  Will have her follow up in about 6 weeks to ensure she is still doing okay and start her on cefadroxil 500mg  BID today for suppression given the severity of her infection and history of relapse.  Discussed that the true test of cure would be to stop antibiotics.

## 2021-02-14 NOTE — Progress Notes (Signed)
Per verbal order from Dr Earlene Plater, 42 cm Single Lumen Peripherally Inserted Central Catheter removed from right basilic, tip intact. No sutures present. RN confirmed length per chart. Dressing was clean and dry. Petroleum dressing applied. Pt advised no heavy lifting with this arm, leave dressing for 24 hours and call the office or seek emergent care if dressing becomes soaked with blood or swelling or sharp pain presents. Patient verbalized understanding and agreement.  Patient's questions answered to their satisfaction. Patient tolerated procedure well, RN walked patient to check out. Pharmacy notified.  Leveon Pelzer Loyola Mast, RN

## 2021-02-14 NOTE — Assessment & Plan Note (Signed)
Lab from 02/09/21 show normalization of her inflammatory markers, BMP and CBC unremarkable.

## 2021-02-14 NOTE — Patient Instructions (Signed)
Thank you for coming to see me today. It was a pleasure seeing you.  To Do: We removed your picc line today and prescribed an oral antibiotic called Cefadroxil 500mg  twice daily for suppression Follow up in about 6 weeks  If you have any questions or concerns, please do not hesitate to call the office at [phone].  Take Care,   [phone]

## 2021-02-14 NOTE — Progress Notes (Signed)
[facility]  Reason for Consult:septic arthritis  Referring Provider: Dr Linus Salmons   HPI:    Tiffany Leonard is a 35 y.o. female with PMHx as below who presents to the clinic for further evaluation of septic arthritis of the right hip.   Patient has previously been followed by my partner, Dr.Manandhar, earlier this year for right hip native joint septic arthritis due to MSSA that was treated with ~6 weeks of cefazolin that concluded in early March 2022.  She was subsequently readmitted to the hospital in May 2022 with recurrence of infection after returning to her orthopedic surgeon with more pain, weakness, and inflammatory markers significantly elevated.  A repeat MRI noted a joint effusion and osteomyelitis by report.  She went to the OR on 5/17 for resection arthroplasty with removal of the femoral head and debridement of the acetabulum.  The femur was also cemented with an antibiotic impregnated spacer.  She had a follow up appointment scheduled with Dr West Bali on 01/23/21 that was not attended and presents today for follow up.  Her IV antibiotics concluded 6/27 and her PICC line remains in place.   She saw surgery on 6/20 and will follow up again with them in 2 weeks from that time period.   She was told by her surgeon that any joint replacement probably would not happen for about 6 months at least.  They have discussed suppressive antibiotics and she would like to proceed with this route.  Labs from 6/24 show CRP and ESR now normalized.  Patient's Medications  New Prescriptions   CEFADROXIL (DURICEF) 500 MG CAPSULE    Take 1 capsule (500 mg total) by mouth 2 (two) times daily.  Previous Medications   ALPRAZOLAM (XANAX) 0.25 MG TABLET    Take 0.25 mg by mouth at bedtime as needed for anxiety.   HYDROCODONE-ACETAMINOPHEN (NORCO/VICODIN) 5-325 MG TABLET    Take 1-2 tablets by mouth every 6 (six) hours as needed for severe pain.    LEVONORGESTREL (MIRENA, 52 MG,) 20 MCG/24HR IUD    1 each by Intrauterine route once.   METHOCARBAMOL (ROBAXIN) 500 MG TABLET    Take 1 tablet (500 mg total) by mouth every 6 (six) hours as needed for muscle spasms.   SERTRALINE (ZOLOFT) 100 MG TABLET    Take 100 mg by mouth daily.  Modified Medications   No medications on file  Discontinued Medications   CEFAZOLIN (ANCEF) IVPB    Inject 2 g into the vein every 8 (eight) hours. Indication:  Septic arthritis and osteomyelitis First Dose: Yes Last Day of Therapy:  02/12/21 Labs - Once weekly:  CBC/D and BMP, Labs - Every other week:  ESR and CRP Method of administration: IV Push Method of administration may be changed at the discretion of home infusion pharmacist based upon assessment of the patient and/or caregiver's ability to self-administer the medication ordered.      Past Medical History:  Diagnosis Date   Anxiety    Arthritis     Social History   Tobacco Use   Smoking status: Never   Smokeless tobacco: Never  Vaping Use   Vaping Use: Never used  Substance Use Topics   Alcohol use: Not Currently   Drug use: Never    No family history on file.  Allergies  Allergen Reactions   Banana Itching    Pt reports itching in mouth and throat    Review of Systems  Constitutional:  Negative  for chills and fever.  Respiratory: Negative.    Cardiovascular: Negative.   Musculoskeletal: Negative.      OBJECTIVE:    Vitals:   02/14/21 0945  BP: 126/85  Pulse: 88  Temp: 97.7 F (36.5 C)  TempSrc: Oral  Weight: 167 lb (75.8 kg)     Body mass index is 23.96 kg/m.  Physical Exam Constitutional:      General: She is not in acute distress.    Appearance: Normal appearance.  HENT:     Head: Normocephalic and atraumatic.  Pulmonary:     Effort: Pulmonary effort is normal. No respiratory distress.  Musculoskeletal:     Comments: Right UE picc in place.  Skin:    General: Skin is warm and dry.  Neurological:      General: No focal deficit present.     Mental Status: She is alert and oriented to person, place, and time.  Psychiatric:        Mood and Affect: Mood normal.        Behavior: Behavior normal.     Labs and Microbiology:  CBC Latest Ref Rng & Units 01/03/2021 01/02/2021 01/01/2021  WBC 4.0 - 10.5 K/uL 9.2 10.8(H) 8.0  Hemoglobin 12.0 - 15.0 g/dL 10.2(L) 10.7(L) 12.8  Hematocrit 36.0 - 46.0 % 31.5(L) 34.1(L) 39.7  Platelets 150 - 400 K/uL 340 352 360   CMP Latest Ref Rng & Units 01/03/2021 01/02/2021 01/01/2021  Glucose 70 - 99 mg/dL 133(H) 158(H) 84  BUN 6 - 20 mg/dL '8 8 11  ' Creatinine 0.44 - 1.00 mg/dL 0.57 0.69 0.64  Sodium 135 - 145 mmol/L 137 134(L) 138  Potassium 3.5 - 5.1 mmol/L 4.2 3.9 3.9  Chloride 98 - 111 mmol/L 101 101 101  CO2 22 - 32 mmol/L '29 26 28  ' Calcium 8.9 - 10.3 mg/dL 9.1 8.7(L) 9.5  Total Protein 6.5 - 8.1 g/dL - - 7.6  Total Bilirubin 0.3 - 1.2 mg/dL - - <0.1(L)  Alkaline Phos 38 - 126 U/L - - 83  AST 15 - 41 U/L - - 20  ALT 0 - 44 U/L - - 32       ASSESSMENT & PLAN:    Septic arthritis of hip (HCC) She has completed 6 weeks of cefazolin for recurrent MSSA right hip native joint septic arthritis and osteomyelitis s/p resection arthroplasty with antibiotic spacer on 01/01/21 with Dr Wynelle Link.  Her PICC line remains in place and will remove today.  Her inflammatory markers have normalized and she follows up with orthopedics in about 1 week to discuss next steps.  She reports placement of a prosthetic joint will likely be deferred for about 6 months. She has no signs or symptoms of infection currently and her pain is much improved.  Will have her follow up in about 6 weeks to ensure she is still doing okay and start her on cefadroxil 542m BID today for suppression given the severity of her infection and history of relapse.  Discussed that the true test of cure would be to stop antibiotics.  Medication monitoring encounter Lab from 02/09/21 show normalization of her  inflammatory markers, BMP and CBC unremarkable.      ARaynelle Highlandfor Infectious Disease [facility] 02/14/2021, 10:18 AM  I spent 30 minutes dedicated to the care of this patient on the date of this encounter to include pre-visit review of records, face-to-face time with the patient discussing septic arthritis, medication monitoring, and OM, and post-visit  ordering of testing.

## 2021-02-15 LAB — ACID FAST CULTURE WITH REFLEXED SENSITIVITIES (MYCOBACTERIA): Acid Fast Culture: NEGATIVE

## 2021-03-27 DIAGNOSIS — Z4789 Encounter for other orthopedic aftercare: Secondary | ICD-10-CM | POA: Diagnosis not present

## 2021-05-02 DIAGNOSIS — Z96649 Presence of unspecified artificial hip joint: Secondary | ICD-10-CM | POA: Diagnosis not present

## 2021-07-18 DIAGNOSIS — D539 Nutritional anemia, unspecified: Secondary | ICD-10-CM | POA: Diagnosis not present

## 2021-07-18 DIAGNOSIS — D892 Hypergammaglobulinemia, unspecified: Secondary | ICD-10-CM | POA: Diagnosis not present

## 2021-07-26 DIAGNOSIS — Z96641 Presence of right artificial hip joint: Secondary | ICD-10-CM | POA: Diagnosis not present

## 2021-10-03 DIAGNOSIS — Z8739 Personal history of other diseases of the musculoskeletal system and connective tissue: Secondary | ICD-10-CM | POA: Diagnosis not present

## 2021-10-03 DIAGNOSIS — Z01818 Encounter for other preprocedural examination: Secondary | ICD-10-CM | POA: Diagnosis not present

## 2021-10-03 DIAGNOSIS — M00051 Staphylococcal arthritis, right hip: Secondary | ICD-10-CM | POA: Diagnosis not present

## 2021-10-31 NOTE — H&P (Signed)
TOTAL HIP ADMISSION H&P ? ?Patient is admitted for reimplantation of right total hip arthroplasty. ? ?Subjective: ? ?Chief Complaint: Right hip pain ? ?HPI: Tiffany Leonard, 36 y.o. female, is nine months s/p right hip resection with antibiotic spacer. She has completed her course of oral antibiotics and is currently doing well. She reports minimal pain in the hip. Labs taken last December were within normal limits, and patient is now at the point where she is no longer fighting an active infection, and we will proceed with planned reimplantation of right THA.  ? ?Patient Active Problem List  ? Diagnosis Date Noted  ? Osteomyelitis of right femur (HCC) 02/14/2021  ? Medication monitoring encounter 09/23/2020  ? Septic arthritis of hip (HCC) 09/11/2020  ? Pregnancy 12/16/2019  ? Encounter for supervision of normal first pregnancy in third trimester 12/15/2019  ? Active labor 07/11/2015  ? ? ?Past Medical History:  ?Diagnosis Date  ? Anxiety   ? Arthritis   ? ? ?Past Surgical History:  ?Procedure Laterality Date  ? broken pelvis  2014  ? EXCISIONAL TOTAL HIP ARTHROPLASTY WITH ANTIBIOTIC SPACERS Right 01/01/2021  ? Procedure: Right hip resection arthroplasty with antibiotic spacer;  Surgeon: Ollen Gross, MD;  Location: [facility];  Service: Orthopedics;  Laterality: Right;   ? INCISION AND DRAINAGE HIP Right 09/11/2020  ? Procedure: Right hip arthrotomy, irrigation and debridement-anterior approach;  Surgeon: Ollen Gross, MD;  Location: [facility];  Service: Orthopedics;  Laterality: Right;   ? SHOULDER SURGERY  2014  ? ? ?Prior to Admission medications   ?Medication Sig Start Date End Date Taking? Authorizing Provider  ?ALPRAZolam (XANAX) 0.25 MG tablet Take 0.25 mg by mouth at bedtime as needed for anxiety. ?Patient not taking: Reported on 02/14/2021    [provider]  ?HYDROcodone-acetaminophen (NORCO/VICODIN) 5-325 MG tablet Take 1-2 tablets by mouth every 6 (six) hours as needed for  severe pain. 01/03/21   Edmisten, Lyn Hollingshead, PA  ?levonorgestrel (MIRENA, 52 MG,) 20 MCG/24HR IUD 1 each by Intrauterine route once.    [provider]  ?methocarbamol (ROBAXIN) 500 MG tablet Take 1 tablet (500 mg total) by mouth every 6 (six) hours as needed for muscle spasms. 01/03/21   Edmisten, Lyn Hollingshead, PA  ?sertraline (ZOLOFT) 100 MG tablet Take 100 mg by mouth daily. ?Patient not taking: Reported on 02/14/2021 11/12/19   [provider]  ? ? ?Allergies  ?Allergen Reactions  ? Banana Itching  ?  Pt reports itching in mouth and throat  ? ? ?Social History  ? ?Socioeconomic History  ? Marital status: Married  ?  Spouse name: Not on file  ? Number of children: Not on file  ? Years of education: Not on file  ? Highest education level: Not on file  ?Occupational History  ? Not on file  ?Tobacco Use  ? Smoking status: Never  ? Smokeless tobacco: Never  ?Vaping Use  ? Vaping Use: Never used  ?Substance and Sexual Activity  ? Alcohol use: Not Currently  ? Drug use: Never  ? Sexual activity: Yes  ?  Birth control/protection: I.U.D.  ?Other Topics Concern  ? Not on file  ?Social History Narrative  ? Not on file  ? ?Social Determinants of Health  ? ?Financial Resource Strain: Not on file  ?Food Insecurity: Not on file  ?Transportation Needs: Not on file  ?Physical Activity: Not on file  ?Stress: Not on file  ?Social Connections: Not on file  ?Intimate Partner Violence:  Not on file  ? ? ?Tobacco Use: Low Risk   ? Smoking Tobacco Use: Never  ? Smokeless Tobacco Use: Never  ? Passive Exposure: Not on file  ? ?Social History  ? ?Substance and Sexual Activity  ?Alcohol Use Not Currently  ? ? ?No family history on file. ? ?ROS: ?Constitutional: no fever, no chills, no night sweats, no significant weight loss ?Cardiovascular: no chest pain, no palpitations ?Respiratory: no cough, no shortness of breath, No COPD ?Gastrointestinal: no vomiting, no nausea ?Musculoskeletal: no swelling in Joints, Joint  Pain ?Neurologic: no numbness, no tingling, no difficulty with balance ? ? ? ?Objective: ? ?Physical Exam: ??? ?Well nourished and well developed.  ?General: Alert and oriented x3, cooperative and pleasant, no acute distress.  ?Head: normocephalic, atraumatic, neck supple.  ?Eyes: EOMI.  ?Respiratory: breath sounds clear in all fields, no wheezing, rales, or rhonchi. ?Cardiovascular: Regular rate and rhythm, no murmurs, gallops or rubs.  ?Abdomen: non-tender to palpation and soft, normoactive bowel sounds. ?Musculoskeletal: ??? ?  ?  ? Right hip exam:  ? There is no swelling.  ? The range of motion: Flexion is 110 degrees, internal rotation is 20 degrees, external rotation is 30 degrees, and abduction is 30 degrees without discomfort. ??? ?Calves soft and nontender. Motor function intact in LE. Strength 5/5 LE bilaterally. ?Neuro: Distal pulses 2+. Sensation to light touch intact in LE. ??? ? ?Vital signs in last 24 hours: ?BP: ()/()  ?Arterial Line BP: ()/()  ? ?Imaging Review ?AP pelvis, AP and lateral of the right hip from last visit demonstrate the spacer is in excellent position with no abnormalities. ? ?Assessment/Plan: ? ?End stage arthritis, right hip ? ?The patient history, physical examination, clinical judgement of the provider and imaging studies are consistent with end stage degenerative joint disease of the right hip and total hip arthroplasty is deemed medically necessary. The treatment options including medical management, injection therapy, arthroscopy and arthroplasty were discussed at length. The risks and benefits of total hip arthroplasty were presented and reviewed. The risks due to aseptic loosening, infection, stiffness, dislocation/subluxation, thromboembolic complications and other imponderables were discussed. The patient acknowledged the explanation, agreed to proceed with the plan and consent was signed. Patient is being admitted for inpatient treatment for surgery, pain control, PT, OT,  prophylactic antibiotics, VTE prophylaxis, progressive ambulation and ADLs and discharge planning.The patient is planning to be discharged  home . ? ? ?Patient's anticipated LOS is less than 2 midnights, meeting these requirements: ?- Younger than 65 ?- Lives within 1 hour of care ?- Has a competent adult at home to recover with post-op recover ?- NO history of ? - Chronic pain requiring opiods ? - Diabetes ? - Coronary Artery Disease ? - Heart failure ? - Heart attack ? - Stroke ? - DVT/VTE ? - Cardiac arrhythmia ? - Respiratory Failure/COPD ? - Renal failure ? - Anemia ? - Advanced Liver disease ? ? ? ?Therapy Plans: HEP ?Disposition: Home with Husband ?Planned DVT Prophylaxis: Aspirin 325mg  ?DME Needed: None ?PCP: , PA-C (clearance received) ?TXA: IV ?Allergies: Banana (itching) ?Anesthesia Concerns: None ?BMI: 27.3 ?Last HgbA1c: n/a ? ?Ph[facility]: [facility] [address] in [place] ? ? ?- Patient was instructed on what medications to stop prior to surgery. ?- Follow-up visit in 2 weeks with Dr. Towanda ?- Begin physical therapy following surgery ?- Pre-operative lab work as pre-surgical testing ?- Prescriptions will be provided in hospital at time of discharge ? ?Lequita Halt, MBA, PA-C ?Orthopedic  Surgery ?[facility] ?  ?

## 2021-11-07 NOTE — Patient Instructions (Signed)
DUE TO COVID-19 ONLY ONE VISITOR  (aged 35 and older)  IS ALLOWED TO COME WITH YOU AND STAY IN THE WAITING ROOM ONLY DURING PRE OP AND PROCEDURE.   ?**NO VISITORS ARE ALLOWED IN THE SHORT STAY AREA OR RECOVERY ROOM!!** ? ?IF YOU WILL BE ADMITTED INTO THE HOSPITAL YOU ARE ALLOWED ONLY TWO SUPPORT PEOPLE DURING VISITATION HOURS ONLY (7 AM -8PM)   ?The support person(s) must pass our screening, gel in and out, and wear a mask at all times, including in the patient?s room. ?Patients must also wear a mask when staff or their support person are in the room. ?Visitors GUEST BADGE MUST BE WORN VISIBLY  ?One adult visitor may remain with you overnight and MUST be in the room by 8 P.M. ?  ? ? Your procedure is scheduled on: 11/21/21 ? ? Report to [facility] Main Entrance ? ?  Report to admitting at : 9:15 AM ? ? Call this number if you have problems the morning of surgery [phone] ? ? Do not eat food :After Midnight. ? ? After Midnight you may have the following liquids until : 9:00 AM  DAY OF SURGERY ? ?Water ?Black Coffee (sugar ok, NO MILK/CREAM OR CREAMERS)  ?Tea (sugar ok, NO MILK/CREAM OR CREAMERS) regular and decaf                             ?Plain Jell-O (NO RED)                                           ?Fruit ices (not with fruit pulp, NO RED)                                     ?Popsicles (NO RED)                                                                  ?Juice: apple, WHITE grape, WHITE cranberry ?Sports drinks like Gatorade (NO RED) ?Clear broth(vegetable,chicken,beef) ?  ?Drink  Ensure drink AT :9:00 AM the day of surgery.   ?  ?The day of surgery:  ?Drink ONE (1) Pre-Surgery Clear Ensure or G2 at AM the morning of surgery. Drink in one sitting. Do not sip.  ?This drink was given to you during your hospital  ?pre-op appointment visit. ?Nothing else to drink after completing the  ?Pre-Surgery Clear Ensure or G2. ?  ?       If you have questions, please contact your surgeon?s office.  ?  ?Oral  Hygiene is also important to reduce your risk of infection.                                    ?Remember - BRUSH YOUR TEETH THE MORNING OF SURGERY WITH YOUR REGULAR TOOTHPASTE ? ? Do NOT smoke after Midnight ? ? Take these medicines the morning of surgery with A SIP OF WATER: sertraline.Tylenol as needed. ? ?DO  NOT TAKE ANY ORAL DIABETIC MEDICATIONS DAY OF YOUR SURGERY ? ?Bring CPAP mask and tubing day of surgery. ?                  ?           You may not have any metal on your body including hair pins, jewelry, and body piercing ? ?           Do not wear make-up, lotions, powders, perfumes/cologne, or deodorant ? ?Do not wear nail polish including gel and S&S, artificial/acrylic nails, or any other type of covering on natural nails including finger and toenails. If you have artificial nails, gel coating, etc. that needs to be removed by a nail salon please have this removed prior to surgery or surgery may need to be canceled/ delayed if the surgeon/ anesthesia feels like they are unable to be safely monitored.  ? ?Do not shave  48 hours prior to surgery.  ? ? Do not bring valuables to the hospital. [facility] NOT ?            RESPONSIBLE   FOR VALUABLES. ? ? Contacts, dentures or bridgework may not be worn into surgery. ? ? Bring small overnight bag day of surgery. ?  ? Patients discharged on the day of surgery will not be allowed to drive home.  Someone NEEDS to stay with you for the first 24 hours after anesthesia. ? ? Special Instructions: Bring a copy of your healthcare power of attorney and living will documents         the day of surgery if you haven't scanned them before. ? ?            Please read over the following fact sheets you were given: IF [place] [phone] ? ?   Council - Preparing for Surgery ?Before surgery, you can play an important role.  Because skin is not sterile, your skin needs to be as free of germs as possible.  You can  reduce the number of germs on your skin by washing with CHG (chlorahexidine gluconate) soap before surgery.  CHG is an antiseptic cleaner which kills germs and bonds with the skin to continue killing germs even after washing. ?Please DO NOT use if you have an allergy to CHG or antibacterial soaps.  If your skin becomes reddened/irritated stop using the CHG and inform your nurse when you arrive at Short Stay. ?Do not shave (including legs and underarms) for at least 48 hours prior to the first CHG shower.  You may shave your face/neck. ?Please follow these instructions carefully: ? 1.  Shower with CHG Soap the night before surgery and the  morning of Surgery. ? 2.  If you choose to wash your hair, wash your hair first as usual with your  normal  shampoo. ? 3.  After you shampoo, rinse your hair and body thoroughly to remove the  shampoo.                           4.  Use CHG as you would any other liquid soap.  You can apply chg directly  to the skin and wash  ?                     Gently with a scrungie or clean washcloth. ? 5.  Apply the CHG Soap to  your body ONLY FROM THE NECK DOWN.   Do not use on face/ open      ?                     Wound or open sores. Avoid contact with eyes, ears mouth and genitals (private parts).  ?                     Production manager,  Genitals (private parts) with your normal soap. ?            6.  Wash thoroughly, paying special attention to the area where your surgery  will be performed. ? 7.  Thoroughly rinse your body with warm water from the neck down. ? 8.  DO NOT shower/wash with your normal soap after using and rinsing off  the CHG Soap. ?               9.  Pat yourself dry with a clean towel. ?           10.  Wear clean pajamas. ?           11.  Place clean sheets on your bed the night of your first shower and do not  sleep with pets. ?Day of Surgery : ?Do not apply any lotions/deodorants the morning of surgery.  Please wear clean clothes to the hospital/surgery center. ? ?FAILURE TO  FOLLOW THESE INSTRUCTIONS MAY RESULT IN THE CANCELLATION OF YOUR SURGERY ?PATIENT SIGNATURE_________________________________ ? ?NURSE SIGNATURE__________________________________ ? ?________________________________________________________________________  ? ?Incentive Spirometer ? ?An incentive spirometer is a tool that can help keep your lungs clear and active. This tool measures how well you are filling your lungs with each breath. Taking long deep breaths may help reverse or decrease the chance of developing breathing (pulmonary) problems (especially infection) following: ?A long period of time when you are unable to move or be active. ?BEFORE THE PROCEDURE  ?If the spirometer includes an indicator to show your best effort, your nurse or respiratory therapist will set it to a desired goal. ?If possible, sit up straight or lean slightly forward. Try not to slouch. ?Hold the incentive spirometer in an upright position. ?INSTRUCTIONS FOR USE  ?Sit on the edge of your bed if possible, or sit up as far as you can in bed or on a chair. ?Hold the incentive spirometer in an upright position. ?Breathe out normally. ?Place the mouthpiece in your mouth and seal your lips tightly around it. ?Breathe in slowly and as deeply as possible, raising the piston or the ball toward the top of the column. ?Hold your breath for 3-5 seconds or for as long as possible. Allow the piston or ball to fall to the bottom of the column. ?Remove the mouthpiece from your mouth and breathe out normally. ?Rest for a few seconds and repeat Steps 1 through 7 at least 10 times every 1-2 hours when you are awake. Take your time and take a few normal breaths between deep breaths. ?The spirometer may include an indicator to show your best effort. Use the indicator as a goal to work toward during each repetition. ?After each set of 10 deep breaths, practice coughing to be sure your lungs are clear. If you have an incision (the cut made at the time of  surgery), support your incision when coughing by placing a pillow or rolled up towels firmly against it. ?Once you are able to get out of bed, walk around indoors  and cough well. You may stop using the incent

## 2021-11-08 ENCOUNTER — Other Ambulatory Visit: Payer: Self-pay

## 2021-11-08 ENCOUNTER — Encounter (HOSPITAL_COMMUNITY): Payer: Self-pay

## 2021-11-08 ENCOUNTER — Encounter (HOSPITAL_COMMUNITY)
Admission: RE | Admit: 2021-11-08 | Discharge: 2021-11-08 | Disposition: A | Discharge status: Home / Self Care | Payer: BC Managed Care – PPO | Source: Ambulatory Visit | Attending: Orthopedic Surgery | Admitting: Orthopedic Surgery

## 2021-11-08 VITALS — BP 138/75 | HR 83 | Temp 98.9°F | Resp 14 | Ht 70.0 in

## 2021-11-08 DIAGNOSIS — Z01818 Encounter for other preprocedural examination: Secondary | ICD-10-CM

## 2021-11-08 DIAGNOSIS — Z01812 Encounter for preprocedural laboratory examination: Secondary | ICD-10-CM | POA: Diagnosis not present

## 2021-11-08 DIAGNOSIS — M00051 Staphylococcal arthritis, right hip: Secondary | ICD-10-CM | POA: Insufficient documentation

## 2021-11-08 LAB — CBC
HCT: 41.1 % (ref 36.0–46.0)
Hemoglobin: 13.6 g/dL (ref 12.0–15.0)
MCH: 30 pg (ref 26.0–34.0)
MCHC: 33.1 g/dL (ref 30.0–36.0)
MCV: 90.7 fL (ref 80.0–100.0)
Platelets: 198 10*3/uL (ref 150–400)
RBC: 4.53 MIL/uL (ref 3.87–5.11)
RDW: 13.7 % (ref 11.5–15.5)
WBC: 7.7 10*3/uL (ref 4.0–10.5)
nRBC: 0 % (ref 0.0–0.2)

## 2021-11-08 LAB — COMPREHENSIVE METABOLIC PANEL
ALT: 22 U/L (ref 0–44)
AST: 22 U/L (ref 15–41)
Albumin: 4.1 g/dL (ref 3.5–5.0)
Alkaline Phosphatase: 49 U/L (ref 38–126)
Anion gap: 6 (ref 5–15)
BUN: 20 mg/dL (ref 6–20)
CO2: 26 mmol/L (ref 22–32)
Calcium: 9 mg/dL (ref 8.9–10.3)
Chloride: 104 mmol/L (ref 98–111)
Creatinine, Ser: 0.68 mg/dL (ref 0.44–1.00)
GFR, Estimated: >60 mL/min (ref >60)
Glucose, Bld: 120 mg/dL — ABNORMAL HIGH (ref 70–99)
Potassium: 3.7 mmol/L (ref 3.5–5.1)
Sodium: 136 mmol/L (ref 135–145)
Total Bilirubin: 0.5 mg/dL (ref 0.3–1.2)
Total Protein: 7.4 g/dL (ref 6.5–8.1)

## 2021-11-08 LAB — SURGICAL PCR SCREEN
MRSA, PCR: NEGATIVE
Staphylococcus aureus: NEGATIVE

## 2021-11-08 LAB — TYPE AND SCREEN
ABO/RH(D): AB NEG
Antibody Screen: NEGATIVE

## 2021-11-08 LAB — PROTIME-INR
INR: 1.1 (ref 0.8–1.2)
Prothrombin Time: 14.1 seconds (ref 11.4–15.2)

## 2021-11-08 NOTE — Progress Notes (Addendum)
For Short Stay: ?COVID SWAB appointment date: N/A ?Date of COVID positive in last 90 days: N/A ?COVID Vaccine: NO ?Bowel Prep reminder: N/A ? ? ?For Anesthesia: ?PCP - Jarrett Soho, PA-C ?Cardiologist -  ? ?Chest x-ray -  ?EKG -  ?Stress Test -  ?ECHO -  ?Cardiac Cath -  ?Pacemaker/ICD device last checked: ?Pacemaker orders received: ?Device Rep notified: ? ?Spinal Cord Stimulator: ? ?Sleep Study -  ?CPAP -  ? ?Fasting Blood Sugar -  ?Checks Blood Sugar _____ times a day ?Date and result of last Hgb A1c- ? ?Blood Thinner Instructions: ?Aspirin Instructions: ?Last Dose: ? ?Activity level: Can go up a flight of stairs and activities of daily living without stopping and without chest pain and/or shortness of breath ?  Able to exercise without chest pain and/or shortness of breath ?  Unable to go up a flight of stairs without chest pain and/or shortness of breath ?   ? ?Anesthesia review:  ? ?Patient denies shortness of breath, fever, cough and chest pain at PAT appointment ? ? ?Patient verbalized understanding of instructions that were given to them at the PAT appointment. Patient was also instructed that they will need to review over the PAT instructions again at home before surgery.  ?

## 2021-11-13 ENCOUNTER — Ambulatory Visit: Payer: BC Managed Care – PPO | Admitting: Radiology

## 2021-11-13 ENCOUNTER — Other Ambulatory Visit: Payer: Self-pay

## 2021-11-13 ENCOUNTER — Encounter: Payer: Self-pay | Admitting: Radiology

## 2021-11-13 VITALS — BP 188/76

## 2021-11-13 DIAGNOSIS — Z30018 Encounter for initial prescription of other contraceptives: Secondary | ICD-10-CM

## 2021-11-13 DIAGNOSIS — Z30432 Encounter for removal of intrauterine contraceptive device: Secondary | ICD-10-CM

## 2021-11-13 MED ORDER — PHEXXI 1.8-1-0.4 % VA GEL: 5.0000 g | VAGINAL | 11 refills | Status: DC

## 2021-11-13 NOTE — Progress Notes (Signed)
? ?  Tiffany Leonard Mar 10, 1986 [id number] ? ? ?History:  36 y.o. G2P2 presents for removal of Mirena IUD.  Reason for discontinuation: acne Pt has been counseled about risks and benefits as well as complications.  Consent is obtained today. ? ?Patient's last menstrual period was 11/11/2021. ? ? ?Past medical history, past surgical history, family history and social history were all reviewed and documented in the EPIC chart. ? ?ROS:  A ROS was performed and pertinent positives and negatives are included. ? ?Exam: ?Vitals:  ? 11/13/21 1104  ?BP: (!) 188/76  ? ?There is no height or weight on file to calculate BMI. ? ?Pelvic exam: ?Vulva:  normal female genitalia ?Vagina:  normal vagina, no discharge, exudate, lesion, or erythema ?Cervix:  Non-tender, Negative CMT, no lesions or redness. IUD strings visualized. ?Uterus:  normal shape, position and consistency  ?  ?Procedure:  Speculum inserted.   ?Cervix visualized, IUD grasps with forceps and removed without difficulty. Pt tolerated procedure well.  ? ?  ?Assessment/Plan: ?1. Encounter for IUD removal ? ?2. Encounter for initial prescription of other contraceptives ? ?- Lactic Ac-Citric Ac-Pot Bitart (PHEXXI) 1.8-1-0.4 % GEL; Place 5 g vaginally as directed.  Dispense: 60 g; Refill: 11  ? ?Removal of Mirena IUD ?            New contraceptive: phexxi ? Planning to begin trying for pregnancy in 3 mos or so ? Begin pnv ?  ? ?Pt aware to call for any concerns, return to office as schedule for annual exam or as needed. ? ? ?Tanda Rockers WHNP-BC, 11:32 AM 11/13/2021  ?

## 2021-11-19 ENCOUNTER — Ambulatory Visit: Payer: Self-pay | Admitting: Radiology

## 2021-11-20 NOTE — Anesthesia Preprocedure Evaluation (Addendum)
Anesthesia Evaluation  ?Patient identified by MRN, date of birth, ID band ?Patient awake ? ? ? ?Reviewed: ?Allergy & Precautions, NPO status , Patient's Chart, lab work & pertinent test results ? ?History of Anesthesia Complications ?Negative for: history of anesthetic complications ? ?Airway ?Mallampati: II ? ?TM Distance: >3 FB ?Neck ROM: Full ? ? ? Dental ?no notable dental hx. ?(+) Dental Advisory Given ?  ?Pulmonary ? ?12/29/2020 SARS coronavirus NEG ?  ?Pulmonary exam normal ? ? ? ? ? ? ? Cardiovascular ?negative cardio ROS ?Normal cardiovascular exam ? ? ?  ?Neuro/Psych ?Anxiety negative neurological ROS ?   ? GI/Hepatic ?negative GI ROS, Neg liver ROS,   ?Endo/Other  ?negative endocrine ROS ? Renal/GU ?negative Renal ROS  ? ?  ?Musculoskeletal ?Septic arthritis hip  ? Abdominal ?  ?Peds ? Hematology ?negative hematology ROS ?(+)   ?Anesthesia Other Findings ? ? Reproductive/Obstetrics ? ?  ? ? ? ? ? ? ? ? ? ? ? ? ? ?  ?  ? ? ? ? ? ? ? ?Anesthesia Physical ? ?Anesthesia Plan ? ?ASA: 2 ? ?Anesthesia Plan: General  ? ?Post-op Pain Management: Ofirmev IV (intra-op)*  ? ?Induction: Intravenous ? ?PONV Risk Score and Plan: 3 and Ondansetron, Treatment may vary due to age or medical condition, Scopolamine patch - Pre-op and Dexamethasone ? ?Airway Management Planned: LMA ? ?Additional Equipment: None ? ?Intra-op Plan:  ? ?Post-operative Plan: Extubation in OR ? ?Informed Consent: I have reviewed the patients History and Physical, chart, labs and discussed the procedure including the risks, benefits and alternatives for the proposed anesthesia with the patient or authorized representative who has indicated his/her understanding and acceptance.  ? ? ? ?Dental advisory given ? ?Plan Discussed with: Anesthesiologist and CRNA ? ?Anesthesia Plan Comments:   ? ? ? ? ? ?Anesthesia Quick Evaluation ? ?

## 2021-11-21 ENCOUNTER — Encounter (HOSPITAL_COMMUNITY): Payer: Self-pay | Admitting: Orthopedic Surgery

## 2021-11-21 ENCOUNTER — Other Ambulatory Visit: Payer: Self-pay

## 2021-11-21 ENCOUNTER — Ambulatory Visit (HOSPITAL_COMMUNITY): Payer: BC Managed Care – PPO | Admitting: Anesthesiology

## 2021-11-21 ENCOUNTER — Inpatient Hospital Stay (HOSPITAL_COMMUNITY)
Admission: AD | Admit: 2021-11-21 | Discharge: 2021-11-23 | DRG: 468 | Disposition: A | Discharge status: Home Health | Payer: BC Managed Care – PPO | Attending: Orthopedic Surgery | Admitting: Orthopedic Surgery

## 2021-11-21 ENCOUNTER — Inpatient Hospital Stay (HOSPITAL_COMMUNITY): Payer: BC Managed Care – PPO

## 2021-11-21 ENCOUNTER — Encounter (HOSPITAL_COMMUNITY)
Admission: AD | Disposition: A | Discharge status: Home Health | Payer: Self-pay | Source: Home / Self Care | Attending: Orthopedic Surgery

## 2021-11-21 DIAGNOSIS — M00859 Arthritis due to other bacteria, unspecified hip: Secondary | ICD-10-CM | POA: Diagnosis not present

## 2021-11-21 DIAGNOSIS — Z01818 Encounter for other preprocedural examination: Secondary | ICD-10-CM

## 2021-11-21 DIAGNOSIS — M00051 Staphylococcal arthritis, right hip: Principal | ICD-10-CM

## 2021-11-21 DIAGNOSIS — M009 Pyogenic arthritis, unspecified: Principal | ICD-10-CM | POA: Diagnosis present

## 2021-11-21 DIAGNOSIS — M00251 Other streptococcal arthritis, right hip: Secondary | ICD-10-CM | POA: Diagnosis not present

## 2021-11-21 DIAGNOSIS — M1611 Unilateral primary osteoarthritis, right hip: Secondary | ICD-10-CM | POA: Diagnosis not present

## 2021-11-21 DIAGNOSIS — Z9889 Other specified postprocedural states: Secondary | ICD-10-CM | POA: Diagnosis not present

## 2021-11-21 DIAGNOSIS — Z471 Aftercare following joint replacement surgery: Secondary | ICD-10-CM | POA: Diagnosis not present

## 2021-11-21 DIAGNOSIS — Z91018 Allergy to other foods: Secondary | ICD-10-CM

## 2021-11-21 DIAGNOSIS — Z975 Presence of (intrauterine) contraceptive device: Secondary | ICD-10-CM

## 2021-11-21 DIAGNOSIS — Z79899 Other long term (current) drug therapy: Secondary | ICD-10-CM

## 2021-11-21 DIAGNOSIS — M1990 Unspecified osteoarthritis, unspecified site: Secondary | ICD-10-CM | POA: Diagnosis not present

## 2021-11-21 DIAGNOSIS — Z793 Long term (current) use of hormonal contraceptives: Secondary | ICD-10-CM | POA: Diagnosis not present

## 2021-11-21 DIAGNOSIS — Z96649 Presence of unspecified artificial hip joint: Secondary | ICD-10-CM | POA: Diagnosis not present

## 2021-11-21 DIAGNOSIS — F419 Anxiety disorder, unspecified: Secondary | ICD-10-CM | POA: Diagnosis not present

## 2021-11-21 DIAGNOSIS — T84090A Other mechanical complication of internal right hip prosthesis, initial encounter: Secondary | ICD-10-CM | POA: Diagnosis not present

## 2021-11-21 DIAGNOSIS — M25551 Pain in right hip: Secondary | ICD-10-CM | POA: Diagnosis not present

## 2021-11-21 DIAGNOSIS — Z96641 Presence of right artificial hip joint: Secondary | ICD-10-CM | POA: Diagnosis present

## 2021-11-21 HISTORY — DX: Presence of unspecified artificial hip joint: Z96.649

## 2021-11-21 HISTORY — PX: REIMPLANTATION OF TOTAL HIP: SHX6051

## 2021-11-21 LAB — PREGNANCY, URINE: Preg Test, Ur: NEGATIVE

## 2021-11-21 SURGERY — REIMPLANTATION OF TOTAL HIP
Anesthesia: General | Site: Hip | Laterality: Right

## 2021-11-21 MED ORDER — BUPIVACAINE-EPINEPHRINE (PF) 0.25% -1:200000 IJ SOLN
INTRAMUSCULAR | Status: AC
  Filled 2021-11-21: qty 30

## 2021-11-21 MED ORDER — LACTATED RINGERS IV SOLN: INTRAVENOUS | Status: DC

## 2021-11-21 MED ORDER — EPHEDRINE SULFATE-NACL 50-0.9 MG/10ML-% IV SOSY
PREFILLED_SYRINGE | INTRAVENOUS | Status: DC | PRN
  Administered 2021-11-21: 5 mg via INTRAVENOUS

## 2021-11-21 MED ORDER — ONDANSETRON HCL 4 MG/2ML IJ SOLN
4.0000 mg | Freq: Four times a day (QID) | INTRAMUSCULAR | Status: DC | PRN
  Administered 2021-11-21: 4 mg via INTRAVENOUS
  Filled 2021-11-21 (×2): qty 2

## 2021-11-21 MED ORDER — METOCLOPRAMIDE HCL 5 MG/ML IJ SOLN: 5.0000 mg | Freq: Three times a day (TID) | INTRAMUSCULAR | Status: DC | PRN

## 2021-11-21 MED ORDER — ONDANSETRON HCL 4 MG/2ML IJ SOLN
INTRAMUSCULAR | Status: DC | PRN
  Administered 2021-11-21: 4 mg via INTRAVENOUS

## 2021-11-21 MED ORDER — SERTRALINE HCL 50 MG PO TABS
50.0000 mg | ORAL_TABLET | Freq: Every day | ORAL | Status: DC
  Administered 2021-11-21 – 2021-11-23 (×3): 50 mg via ORAL
  Filled 2021-11-21 (×3): qty 1

## 2021-11-21 MED ORDER — MORPHINE SULFATE (PF) 2 MG/ML IV SOLN
0.5000 mg | INTRAVENOUS | Status: DC | PRN
  Administered 2021-11-21: 1 mg via INTRAVENOUS
  Filled 2021-11-21: qty 1

## 2021-11-21 MED ORDER — HYDROMORPHONE HCL 1 MG/ML IJ SOLN
INTRAMUSCULAR | Status: DC | PRN
  Administered 2021-11-21 (×4): .5 mg via INTRAVENOUS

## 2021-11-21 MED ORDER — FENTANYL CITRATE (PF) 250 MCG/5ML IJ SOLN
INTRAMUSCULAR | Status: AC
  Filled 2021-11-21: qty 5

## 2021-11-21 MED ORDER — ONDANSETRON HCL 4 MG PO TABS: 4.0000 mg | ORAL_TABLET | Freq: Four times a day (QID) | ORAL | Status: DC | PRN

## 2021-11-21 MED ORDER — CEFAZOLIN SODIUM-DEXTROSE 2-4 GM/100ML-% IV SOLN
2.0000 g | Freq: Four times a day (QID) | INTRAVENOUS | Status: AC
  Administered 2021-11-21 – 2021-11-22 (×2): 2 g via INTRAVENOUS
  Filled 2021-11-21 (×2): qty 100

## 2021-11-21 MED ORDER — HYDROCODONE-ACETAMINOPHEN 5-325 MG PO TABS
ORAL_TABLET | ORAL | Status: AC
  Filled 2021-11-21: qty 2

## 2021-11-21 MED ORDER — FENTANYL CITRATE PF 50 MCG/ML IJ SOSY
25.0000 ug | PREFILLED_SYRINGE | INTRAMUSCULAR | Status: DC | PRN
  Administered 2021-11-21 (×3): 50 ug via INTRAVENOUS

## 2021-11-21 MED ORDER — SUGAMMADEX SODIUM 200 MG/2ML IV SOLN
INTRAVENOUS | Status: DC | PRN
  Administered 2021-11-21: 200 mg via INTRAVENOUS

## 2021-11-21 MED ORDER — HYDROCODONE-ACETAMINOPHEN 5-325 MG PO TABS
1.0000 | ORAL_TABLET | ORAL | Status: DC | PRN
  Administered 2021-11-21: 2 via ORAL
  Filled 2021-11-21: qty 2

## 2021-11-21 MED ORDER — TRANEXAMIC ACID-NACL 1000-0.7 MG/100ML-% IV SOLN
1000.0000 mg | INTRAVENOUS | Status: AC
  Administered 2021-11-21: 1000 mg via INTRAVENOUS
  Filled 2021-11-21: qty 100

## 2021-11-21 MED ORDER — CEFAZOLIN SODIUM-DEXTROSE 2-4 GM/100ML-% IV SOLN
2.0000 g | INTRAVENOUS | Status: AC
  Administered 2021-11-21: 2 g via INTRAVENOUS
  Filled 2021-11-21: qty 100

## 2021-11-21 MED ORDER — LIDOCAINE 2% (20 MG/ML) 5 ML SYRINGE
INTRAMUSCULAR | Status: DC | PRN
Start: 2021-11-21 — End: 2021-11-21
  Administered 2021-11-21: 100 mg via INTRAVENOUS

## 2021-11-21 MED ORDER — ACETAMINOPHEN 10 MG/ML IV SOLN
1000.0000 mg | Freq: Four times a day (QID) | INTRAVENOUS | Status: DC
  Administered 2021-11-21: 1000 mg via INTRAVENOUS
  Filled 2021-11-21: qty 100

## 2021-11-21 MED ORDER — HYDROCODONE-ACETAMINOPHEN 7.5-325 MG PO TABS
1.0000 | ORAL_TABLET | ORAL | Status: DC | PRN
  Administered 2021-11-21 – 2021-11-23 (×9): 2 via ORAL
  Filled 2021-11-21 (×9): qty 2

## 2021-11-21 MED ORDER — SODIUM CHLORIDE 0.9 % IV SOLN: INTRAVENOUS | Status: DC

## 2021-11-21 MED ORDER — SODIUM CHLORIDE 0.9 % IR SOLN
Status: DC | PRN
  Administered 2021-11-21: 1000 mL

## 2021-11-21 MED ORDER — AMISULPRIDE (ANTIEMETIC) 5 MG/2ML IV SOLN
10.0000 mg | Freq: Once | INTRAVENOUS | Status: AC | PRN
  Administered 2021-11-21: 10 mg via INTRAVENOUS

## 2021-11-21 MED ORDER — POLYETHYLENE GLYCOL 3350 17 G PO PACK: 17.0000 g | PACK | Freq: Every day | ORAL | Status: DC | PRN

## 2021-11-21 MED ORDER — CHLORHEXIDINE GLUCONATE 0.12 % MT SOLN
15.0000 mL | Freq: Once | OROMUCOSAL | Status: AC
  Administered 2021-11-21: 15 mL via OROMUCOSAL

## 2021-11-21 MED ORDER — EPHEDRINE 5 MG/ML INJ
INTRAVENOUS | Status: AC
  Filled 2021-11-21: qty 5

## 2021-11-21 MED ORDER — METOCLOPRAMIDE HCL 5 MG PO TABS: 5.0000 mg | ORAL_TABLET | Freq: Three times a day (TID) | ORAL | Status: DC | PRN

## 2021-11-21 MED ORDER — ORAL CARE MOUTH RINSE: 15.0000 mL | Freq: Once | OROMUCOSAL | Status: AC

## 2021-11-21 MED ORDER — DOCUSATE SODIUM 100 MG PO CAPS
100.0000 mg | ORAL_CAPSULE | Freq: Two times a day (BID) | ORAL | Status: DC
  Administered 2021-11-21 – 2021-11-23 (×4): 100 mg via ORAL
  Filled 2021-11-21 (×4): qty 1

## 2021-11-21 MED ORDER — SODIUM CHLORIDE 0.9 % IR SOLN
Status: DC | PRN
  Administered 2021-11-21: 3000 mL

## 2021-11-21 MED ORDER — DEXAMETHASONE SODIUM PHOSPHATE 10 MG/ML IJ SOLN
8.0000 mg | Freq: Once | INTRAMUSCULAR | Status: AC
  Administered 2021-11-21: 8 mg via INTRAVENOUS

## 2021-11-21 MED ORDER — MIDAZOLAM HCL 5 MG/5ML IJ SOLN
INTRAMUSCULAR | Status: DC | PRN
  Administered 2021-11-21: 2 mg via INTRAVENOUS

## 2021-11-21 MED ORDER — METHOCARBAMOL 500 MG PO TABS
500.0000 mg | ORAL_TABLET | Freq: Four times a day (QID) | ORAL | Status: DC | PRN
  Administered 2021-11-21 – 2021-11-22 (×3): 500 mg via ORAL
  Filled 2021-11-21 (×3): qty 1

## 2021-11-21 MED ORDER — MIDAZOLAM HCL 2 MG/2ML IJ SOLN
INTRAMUSCULAR | Status: AC
Start: 2021-11-21 — End: ?
  Filled 2021-11-21: qty 2

## 2021-11-21 MED ORDER — FENTANYL CITRATE PF 50 MCG/ML IJ SOSY
PREFILLED_SYRINGE | INTRAMUSCULAR | Status: AC
  Filled 2021-11-21: qty 1

## 2021-11-21 MED ORDER — FENTANYL CITRATE PF 50 MCG/ML IJ SOSY
PREFILLED_SYRINGE | INTRAMUSCULAR | Status: AC
  Filled 2021-11-21: qty 2

## 2021-11-21 MED ORDER — BISACODYL 10 MG RE SUPP: 10.0000 mg | Freq: Every day | RECTAL | Status: DC | PRN

## 2021-11-21 MED ORDER — PROPOFOL 10 MG/ML IV BOLUS
INTRAVENOUS | Status: DC | PRN
  Administered 2021-11-21: 180 mg via INTRAVENOUS

## 2021-11-21 MED ORDER — DEXAMETHASONE SODIUM PHOSPHATE 10 MG/ML IJ SOLN
10.0000 mg | Freq: Once | INTRAMUSCULAR | Status: AC
  Administered 2021-11-22: 10 mg via INTRAVENOUS
  Filled 2021-11-21: qty 1

## 2021-11-21 MED ORDER — METHOCARBAMOL 500 MG IVPB - SIMPLE MED
500.0000 mg | Freq: Four times a day (QID) | INTRAVENOUS | Status: DC | PRN
  Administered 2021-11-21: 500 mg via INTRAVENOUS
  Filled 2021-11-21: qty 50

## 2021-11-21 MED ORDER — PHENYLEPHRINE HCL-NACL 20-0.9 MG/250ML-% IV SOLN
INTRAVENOUS | Status: AC
  Filled 2021-11-21: qty 250

## 2021-11-21 MED ORDER — ACETAMINOPHEN 325 MG PO TABS: 325.0000 mg | ORAL_TABLET | Freq: Four times a day (QID) | ORAL | Status: DC | PRN

## 2021-11-21 MED ORDER — AMISULPRIDE (ANTIEMETIC) 5 MG/2ML IV SOLN
INTRAVENOUS | Status: AC
  Filled 2021-11-21: qty 4

## 2021-11-21 MED ORDER — PHENOL 1.4 % MT LIQD: 1.0000 | OROMUCOSAL | Status: DC | PRN

## 2021-11-21 MED ORDER — ASPIRIN EC 325 MG PO TBEC
325.0000 mg | DELAYED_RELEASE_TABLET | Freq: Two times a day (BID) | ORAL | Status: DC
  Administered 2021-11-22 – 2021-11-23 (×3): 325 mg via ORAL
  Filled 2021-11-21 (×3): qty 1

## 2021-11-21 MED ORDER — HYDROMORPHONE HCL 2 MG/ML IJ SOLN
INTRAMUSCULAR | Status: AC
  Filled 2021-11-21: qty 1

## 2021-11-21 MED ORDER — PROPOFOL 10 MG/ML IV BOLUS
INTRAVENOUS | Status: AC
  Filled 2021-11-21: qty 20

## 2021-11-21 MED ORDER — DEXAMETHASONE SODIUM PHOSPHATE 10 MG/ML IJ SOLN
INTRAMUSCULAR | Status: AC
  Filled 2021-11-21: qty 1

## 2021-11-21 MED ORDER — POVIDONE-IODINE 10 % EX SWAB
2.0000 "application " | Freq: Once | CUTANEOUS | Status: AC
  Administered 2021-11-21: 2 via TOPICAL

## 2021-11-21 MED ORDER — FENTANYL CITRATE (PF) 250 MCG/5ML IJ SOLN
INTRAMUSCULAR | Status: DC | PRN
  Administered 2021-11-21 (×5): 50 ug via INTRAVENOUS
  Administered 2021-11-21: 100 ug via INTRAVENOUS
  Administered 2021-11-21: 50 ug via INTRAVENOUS
  Administered 2021-11-21: 100 ug via INTRAVENOUS

## 2021-11-21 MED ORDER — ONDANSETRON HCL 4 MG/2ML IJ SOLN
INTRAMUSCULAR | Status: AC
  Filled 2021-11-21: qty 2

## 2021-11-21 MED ORDER — MENTHOL 3 MG MT LOZG: 1.0000 | LOZENGE | OROMUCOSAL | Status: DC | PRN

## 2021-11-21 MED ORDER — ROCURONIUM BROMIDE 10 MG/ML (PF) SYRINGE
PREFILLED_SYRINGE | INTRAVENOUS | Status: DC | PRN
  Administered 2021-11-21: 80 mg via INTRAVENOUS

## 2021-11-21 MED ORDER — ALPRAZOLAM 0.25 MG PO TABS: 0.2500 mg | ORAL_TABLET | Freq: Every evening | ORAL | Status: DC | PRN

## 2021-11-21 MED ORDER — METHOCARBAMOL 500 MG IVPB - SIMPLE MED
INTRAVENOUS | Status: AC
  Filled 2021-11-21: qty 50

## 2021-11-21 SURGICAL SUPPLY — 77 items
BAG COUNTER SPONGE SURGICOUNT (BAG) ×1 IMPLANT
BAG ZIPLOCK 12X15 (MISCELLANEOUS) ×6 IMPLANT
BIT DRILL 2.8X128 (BIT) ×2 IMPLANT
BLADE EXTENDED COATED 6.5IN (ELECTRODE) ×2 IMPLANT
BLADE SAW SAG 73X25 THK (BLADE) ×1
BLADE SAW SGTL 73X25 THK (BLADE) ×1 IMPLANT
BONE CEMENT GENTAMICIN (Cement) ×6 IMPLANT
BOWL SMART MIX CTS (DISPOSABLE) ×2 IMPLANT
CEMENT BONE GENTAMICIN 40 (Cement) IMPLANT
CLSR STERI-STRIP ANTIMIC 1/2X4 (GAUZE/BANDAGES/DRESSINGS) ×1 IMPLANT
COVER SURGICAL LIGHT HANDLE (MISCELLANEOUS) ×2 IMPLANT
CUP ACETBLR 52 OD PINNACLE (Hips) ×1 IMPLANT
DRAPE INCISE IOBAN 66X45 STRL (DRAPES) ×2 IMPLANT
DRAPE ORTHO SPLIT 77X108 STRL (DRAPES) ×2
DRAPE POUCH INSTRU U-SHP 10X18 (DRAPES) ×1 IMPLANT
DRAPE SURG ORHT 6 SPLT 77X108 (DRAPES) ×2 IMPLANT
DRAPE U-SHAPE 47X51 STRL (DRAPES) ×2 IMPLANT
DRESSING AQUACEL AG SP 3.5X10 (GAUZE/BANDAGES/DRESSINGS) IMPLANT
DRESSING MEPILEX FLEX 4X4 (GAUZE/BANDAGES/DRESSINGS) ×2 IMPLANT
DRSG AQUACEL AG ADV 3.5X10 (GAUZE/BANDAGES/DRESSINGS) ×1 IMPLANT
DRSG AQUACEL AG SP 3.5X10 (GAUZE/BANDAGES/DRESSINGS) ×2
DRSG EMULSION OIL 3X16 NADH (GAUZE/BANDAGES/DRESSINGS) ×2 IMPLANT
DRSG MEPILEX BORDER 4X8 (GAUZE/BANDAGES/DRESSINGS) ×1 IMPLANT
DRSG MEPILEX FLEX 4X4 (GAUZE/BANDAGES/DRESSINGS) ×4
DURAPREP 26ML APPLICATOR (WOUND CARE) ×2 IMPLANT
ELECT REM PT RETURN 15FT ADLT (MISCELLANEOUS) ×2 IMPLANT
EVACUATOR 1/8 PVC DRAIN (DRAIN) ×1 IMPLANT
FACESHIELD WRAPAROUND (MASK) IMPLANT
FACESHIELD WRAPAROUND OR TEAM (MASK) ×4 IMPLANT
GAUZE SPONGE 4X4 12PLY STRL (GAUZE/BANDAGES/DRESSINGS) ×1 IMPLANT
GLOVE SRG 8 PF TXTR STRL LF DI (GLOVE) ×3 IMPLANT
GLOVE SURG ENC MOIS LTX SZ6.5 (GLOVE) ×4 IMPLANT
GLOVE SURG ENC MOIS LTX SZ7.5 (GLOVE) ×2 IMPLANT
GLOVE SURG ENC MOIS LTX SZ8 (GLOVE) ×4 IMPLANT
GLOVE SURG POLYISO LF SZ7 (GLOVE) ×2 IMPLANT
GLOVE SURG UNDER POLY LF SZ7 (GLOVE) ×4 IMPLANT
GLOVE SURG UNDER POLY LF SZ8 (GLOVE) ×3
GOWN STRL REUS W/ TWL LRG LVL3 (GOWN DISPOSABLE) ×3 IMPLANT
GOWN STRL REUS W/TWL LRG LVL3 (GOWN DISPOSABLE) ×3
HANDPIECE INTERPULSE COAX TIP (DISPOSABLE) ×1
HEAD FEM STD 32X+5 STRL (Hips) ×1 IMPLANT
IMMOBILIZER KNEE 20 (SOFTGOODS)
IMMOBILIZER KNEE 20 THIGH 36 (SOFTGOODS) IMPLANT
KIT TURNOVER KIT A (KITS) ×1 IMPLANT
LINER ACET CUP 42MMX32MM (Hips) ×1 IMPLANT
MANIFOLD NEPTUNE II (INSTRUMENTS) ×2 IMPLANT
NDL SAFETY ECLIPSE 18X1.5 (NEEDLE) IMPLANT
NEEDLE HYPO 18GX1.5 SHARP (NEEDLE)
NS IRRIG 1000ML POUR BTL (IV SOLUTION) ×2 IMPLANT
PADDING CAST COTTON 6X4 STRL (CAST SUPPLIES) ×1 IMPLANT
PASSER SUT SWANSON 36MM LOOP (INSTRUMENTS) ×2 IMPLANT
PIN ALTRX NEUTRAL X OD 32X52 (Pin) ×1 IMPLANT
PROTECTOR NERVE ULNAR (MISCELLANEOUS) ×2 IMPLANT
REAMER ROD DEEP FLUTE 2.5X950 (INSTRUMENTS) ×1 IMPLANT
SET HNDPC FAN SPRY TIP SCT (DISPOSABLE) IMPLANT
SPONGE T-LAP 18X18 ~~LOC~~+RFID (SPONGE) ×4 IMPLANT
STAPLER VISISTAT 35W (STAPLE) ×1 IMPLANT
STEM CEMENTED SUMMIT SZ2 (Stem) ×1 IMPLANT
STOCKINETTE 8 INCH (MISCELLANEOUS) ×1 IMPLANT
SUCTION FRAZIER HANDLE 10FR (MISCELLANEOUS) ×1
SUCTION TUBE FRAZIER 10FR DISP (MISCELLANEOUS) ×1 IMPLANT
SUT ETHIBOND NAB CT1 #1 30IN (SUTURE) ×3 IMPLANT
SUT MNCRL AB 4-0 PS2 18 (SUTURE) ×1 IMPLANT
SUT STRATAFIX 0 PDS 27 VIOLET (SUTURE) ×2
SUT VIC AB 1 CT1 27 (SUTURE) ×2
SUT VIC AB 1 CT1 27XBRD ANTBC (SUTURE) ×3 IMPLANT
SUT VIC AB 2-0 CT1 27 (SUTURE) ×2
SUT VIC AB 2-0 CT1 TAPERPNT 27 (SUTURE) ×3 IMPLANT
SUT VLOC 180 0 24IN GS25 (SUTURE) ×2 IMPLANT
SUTURE STRATFX 0 PDS 27 VIOLET (SUTURE) IMPLANT
SWAB COLLECTION DEVICE MRSA (MISCELLANEOUS) ×2 IMPLANT
SWAB CULTURE ESWAB REG 1ML (MISCELLANEOUS) ×1 IMPLANT
SYR 50ML LL SCALE MARK (SYRINGE) IMPLANT
TOWEL OR 17X26 10 PK STRL BLUE (TOWEL DISPOSABLE) ×3 IMPLANT
TRAY FOLEY MTR SLVR 16FR STAT (SET/KITS/TRAYS/PACK) ×2 IMPLANT
TUBE SUCTION HIGH CAP CLEAR NV (SUCTIONS) ×1 IMPLANT
WATER STERILE IRR 1000ML POUR (IV SOLUTION) ×3 IMPLANT

## 2021-11-21 NOTE — Discharge Instructions (Signed)
? ?Dr. Homero Fellers Leonard ?Total Joint Specialist ?[facility] ?[address] ?[place], [place] [address] ?[phone] ? ?POSTERIOR TOTAL HIP REPLACEMENT POSTOPERATIVE DIRECTIONS ? ?Hip Rehabilitation, Guidelines Following Surgery  ?The results of a hip operation are greatly improved after range of motion and muscle strengthening exercises. Follow all safety measures which are given to protect your hip. If any of these exercises cause increased pain or swelling in your joint, decrease the amount until you are comfortable again. Then slowly increase the exercises. Call your caregiver if you have problems or questions.  ? ?BLOOD CLOT PREVENTION ?Take a 325 mg Aspirin two times a day for three weeks following surgery. Then take an 81 mg Aspirin once a day for three weeks. Then discontinue Aspirin. ?You may resume your vitamins/supplements upon discharge from the hospital. ?Do not take any NSAIDs (Advil, Aleve, Ibuprofen, Meloxicam, etc.) until you have discontinued the 325 mg Aspirin.  ? ?PRECAUTIONS (6 WEEKS FOLLOWING SURGERY) ?Do not bend your hip past a 90 degree angle ?Do not cross your legs. ?Don?t twist your hip inwards- keep knees and toes pointed upwards ? ? ?HOME CARE INSTRUCTIONS  ?Remove items at home which could result in a fall. This includes throw rugs or furniture in walking pathways.  ?ICE to the affected hip every three hours for 30 minutes at a time and then as needed for pain and swelling.  Continue to use ice on the hip for pain and swelling from surgery. You may notice swelling that will progress down to the foot and ankle.  This is normal after surgery.  Elevate the leg when you are not up walking on it.   ?Continue to use the breathing machine which will help keep your temperature down.  It is common for your temperature to cycle up and down following surgery, especially at night when you are not up moving around and exerting yourself.  The breathing machine keeps your lungs expanded  and your temperature down. ? ?DIET ?You may resume your previous home diet once your are discharged from the hospital. ? ?DRESSING / WOUND CARE / SHOWERING ?Keep the surgical dressing until follow up.  The dressing is water proof, so you can shower without any extra covering.  IF THE DRESSING FALLS OFF or the wound gets wet inside, change the dressing with sterile gauze.  Please use good hand washing techniques before changing the dressing.  Do not use any lotions or creams on the incision until instructed by your surgeon.   ?You may start showering once you are discharged home but do not submerge the incision under water. Just pat the incision dry and apply a dry gauze dressing on daily. ?Change the surgical dressing daily and reapply a dry dressing each time. ? ?ACTIVITY ?Walk with your walker as instructed. ?Use walker as long as suggested by your caregivers. ?Avoid periods of inactivity such as sitting longer than an hour when not asleep. This helps prevent blood clots.  ?You may resume a sexual relationship in one month or when given the OK by your doctor.  ?You may return to work once you are cleared by your doctor.  ?Do not drive a car for 6 weeks or until released by you surgeon.  ?Do not drive while taking narcotics. ? ?WEIGHT BEARING ?Weight bearing as tolerated with assist device (walker, cane, etc) as directed, use it as long as suggested by your surgeon or therapist, typically at least 4-6 weeks. ? ?POSTOPERATIVE CONSTIPATION PROTOCOL ?Constipation - defined medically  as fewer than three stools per week and severe constipation as less than one stool per week. ? ?One of the most common issues patients have following surgery is constipation.  Even if you have a regular bowel pattern at home, your normal regimen is likely to be disrupted due to multiple reasons following surgery.  Combination of anesthesia, postoperative narcotics, change in appetite and fluid intake all can affect your bowels.  In order  to avoid complications following surgery, here are some recommendations in order to help you during your recovery period. ? ?Colace (docusate) - Pick up an over-the-counter form of Colace or another stool softener and take twice a day as long as you are requiring postoperative pain medications.  Take with a full glass of water daily.  If you experience loose stools or diarrhea, hold the colace until you stool forms back up.  If your symptoms do not get better within 1 week or if they get worse, check with your doctor. ? ?Dulcolax (bisacodyl) - Pick up over-the-counter and take as directed by the product packaging as needed to assist with the movement of your bowels.  Take with a full glass of water.  Use this product as needed if not relieved by Colace only.  ? ?MiraLax (polyethylene glycol) - Pick up over-the-counter to have on hand.  MiraLax is a solution that will increase the amount of water in your bowels to assist with bowel movements.  Take as directed and can mix with a glass of water, juice, soda, coffee, or tea.  Take if you go more than two days without a movement. ?Do not use MiraLax more than once per day. Call your doctor if you are still constipated or irregular after using this medication for 7 days in a row. ? ?If you continue to have problems with postoperative constipation, please contact the office for further assistance and recommendations.  If you experience "the worst abdominal pain ever" or develop nausea or vomiting, please contact the office immediatly for further recommendations for treatment. ? ?ITCHING ? If you experience itching with your medications, try taking only a single pain pill, or even half a pain pill at a time.  You can also use Benadryl over the counter for itching or also to help with sleep.  ? ?TED HOSE STOCKINGS ?Wear the elastic stockings on both legs for three weeks following surgery during the day but you may remove then at night for sleeping. ? ?MEDICATIONS ?See your  medication summary on the ?After Visit Summary? that the nursing staff will review with you prior to discharge.  You may have some home medications which will be placed on hold until you complete the course of blood thinner medication.  It is important for you to complete the blood thinner medication as prescribed by your surgeon.  Continue your approved medications as instructed at time of discharge. ? ?PRECAUTIONS ?If you experience chest pain or shortness of breath - call 911 immediately for transfer to the hospital emergency department.  ?If you develop a fever greater that 101 F, purulent drainage from wound, increased redness or drainage from wound, foul odor from the wound/dressing, or calf pain - CONTACT YOUR SURGEON.   ?                                                ?FOLLOW-UP APPOINTMENTS ?Make  sure you keep all of your appointments after your operation with your surgeon and caregivers. You should call the office at the above phone number and make an appointment for approximately two weeks after the date of your surgery or on the date instructed by your surgeon outlined in the "After Visit Summary". ? ?RANGE OF MOTION AND STRENGTHENING EXERCISES  ?These exercises are designed to help you keep full movement of your hip joint. Follow your caregiver's or physical therapist's instructions. Perform all exercises about fifteen times, three times per day or as directed. Exercise both hips, even if you have had only one joint replacement. These exercises can be done on a training (exercise) mat, on the floor, on a table or on a bed. Use whatever works the best and is most comfortable for you. Use music or television while you are exercising so that the exercises are a pleasant break in your day. This will make your life better with the exercises acting as a break in routine you can look forward to.  ?Lying on your back, slowly slide your foot toward your buttocks, raising your knee up off the floor. Then slowly  slide your foot back down until your leg is straight again.  ?Lying on your back spread your legs as far apart as you can without causing discomfort.  ?Lying on your side, raise your upper leg and foot s

## 2021-11-21 NOTE — Brief Op Note (Signed)
11/21/2021 ? ?1:55 PM ? ?PATIENT:  Tiffany Leonard  36 y.o. female ? ?PRE-OPERATIVE DIAGNOSIS:  status post right hip resection with antibiotic spacer ? ?POST-OPERATIVE DIAGNOSIS:  status post right hip resection with antibiotic spacer ? ?PROCEDURE:  RIGHT HIP IRRIGATION AND DEBRIDEMENT; ANTIBIOTIC SPACER EXCHANGE ? ?SURGEON:  Surgeon(s) and Role: ?   Gaynelle Arabian, MD - Primary ? ?PHYSICIAN ASSISTANT:  ? ?ASSISTANTS:Sean Roda Shutters, PA-C  ? ?ANESTHESIA:   general ? ?EBL:  500 mL  ? ?BLOOD ADMINISTERED:none ? ?DRAINS: none  ? ?LOCAL MEDICATIONS USED:  NONE ? ?COUNTS:  YES ? ?TOURNIQUET:  * No tourniquets in log * ? ?DICTATION: .Other Dictation: Dictation Number [id number] ? ?PLAN OF CARE: Admit to inpatient  ? ?PATIENT DISPOSITION:  PACU - hemodynamically stable. ?  ? ? ?

## 2021-11-21 NOTE — Anesthesia Procedure Notes (Signed)
Procedure Name: Intubation ?Date/Time: 11/21/2021 11:21 AM ?Performed by: Talbot Grumbling, CRNA ?Pre-anesthesia Checklist: Patient identified, Emergency Drugs available, Suction available and Patient being monitored ?Patient Re-evaluated:Patient Re-evaluated prior to induction ?Oxygen Delivery Method: Circle system utilized ?Preoxygenation: Pre-oxygenation with 100% oxygen ?Induction Type: IV induction ?Ventilation: Mask ventilation without difficulty ?Laryngoscope Size: Mac and 3 ?Grade View: Grade I ?Tube type: Oral ?Tube size: 7.0 mm ?Number of attempts: 1 ?Airway Equipment and Method: Stylet ?Placement Confirmation: ETT inserted through vocal cords under direct vision, positive ETCO2 and breath sounds checked- equal and bilateral ?Secured at: 22 cm ?Tube secured with: Tape ?Dental Injury: Teeth and Oropharynx as per pre-operative assessment  ? ? ? ? ?

## 2021-11-21 NOTE — Transfer of Care (Signed)
Immediate Anesthesia Transfer of Care Note ? ?Patient: Tiffany Leonard ? ?Procedure(s) Performed: REIMPLANTATION OF TOTAL HIP (Right: Hip) ? ?Patient Location: PACU ? ?Anesthesia Type:General ? ?Level of Consciousness: awake, alert  and oriented ? ?Airway & Oxygen Therapy: Patient Spontanous Breathing and Patient connected to face mask oxygen ? ?Post-op Assessment: Report given to RN and Post -op Vital signs reviewed and stable ? ?Post vital signs: Reviewed and stable ? ?Last Vitals:  ?Vitals Value Taken Time  ?BP 149/89 11/21/21 1415  ?Temp    ?Pulse 112 11/21/21 1417  ?Resp 15 11/21/21 1417  ?SpO2 100 % 11/21/21 1417  ?Vitals shown include unvalidated device data. ? ?Last Pain:  ?Vitals:  ? 11/21/21 1023  ?TempSrc: Oral  ?   ? ?  ? ?Complications: No notable events documented. ?

## 2021-11-21 NOTE — Interval H&P Note (Signed)
History and Physical Interval Note: ? ?11/21/2021 ?11:05 AM ? ?Tiffany Leonard  has presented today for surgery, with the diagnosis of status post right hip resection with antibiotic spacer.  The various methods of treatment have been discussed with the patient and family. After consideration of risks, benefits and other options for treatment, the patient has consented to  Procedure(s): ?REIMPLANTATION OF TOTAL HIP (Right) as a surgical intervention.  The patient's history has been reviewed, patient examined, no change in status, stable for surgery.  I have reviewed the patient's chart and labs.  Questions were answered to the patient's satisfaction.   ? ? ?Tiffany Leonard ? ? ?

## 2021-11-21 NOTE — Anesthesia Postprocedure Evaluation (Signed)
Anesthesia Post Note ? ?Patient: Tiffany Leonard ? ?Procedure(s) Performed: REIMPLANTATION OF TOTAL HIP (Right: Hip) ? ?  ? ?Patient location during evaluation: PACU ?Anesthesia Type: General ?Level of consciousness: sedated ?Pain management: pain level controlled ?Vital Signs Assessment: post-procedure vital signs reviewed and stable ?Respiratory status: spontaneous breathing and respiratory function stable ?Cardiovascular status: stable ?Postop Assessment: no apparent nausea or vomiting ?Anesthetic complications: no ? ? ?No notable events documented. ? ?Last Vitals:  ?Vitals:  ? 11/21/21 1515 11/21/21 1547  ?BP: 132/75 137/83  ?Pulse: (!) 107 96  ?Resp: 15 14  ?Temp: 36.7 ?C 36.8 ?C  ?SpO2: 98% 99%  ?  ?Last Pain:  ?Vitals:  ? 11/21/21 1547  ?TempSrc: Oral  ?PainSc:   ? ? ?  ?  ?  ?  ?  ?  ? ?Leston Schueller DANIEL ? ? ? ? ?

## 2021-11-22 ENCOUNTER — Inpatient Hospital Stay: Payer: Self-pay

## 2021-11-22 ENCOUNTER — Encounter (HOSPITAL_COMMUNITY): Payer: Self-pay | Admitting: Orthopedic Surgery

## 2021-11-22 DIAGNOSIS — Z96649 Presence of unspecified artificial hip joint: Secondary | ICD-10-CM

## 2021-11-22 LAB — CBC
HCT: 30.5 % — ABNORMAL LOW (ref 36.0–46.0)
Hemoglobin: 10.1 g/dL — ABNORMAL LOW (ref 12.0–15.0)
MCH: 30.1 pg (ref 26.0–34.0)
MCHC: 33.1 g/dL (ref 30.0–36.0)
MCV: 91 fL (ref 80.0–100.0)
Platelets: 174 10*3/uL (ref 150–400)
RBC: 3.35 MIL/uL — ABNORMAL LOW (ref 3.87–5.11)
RDW: 13.9 % (ref 11.5–15.5)
WBC: 11 10*3/uL — ABNORMAL HIGH (ref 4.0–10.5)
nRBC: 0 % (ref 0.0–0.2)

## 2021-11-22 LAB — ACID FAST SMEAR (AFB, MYCOBACTERIA): Acid Fast Smear: NEGATIVE

## 2021-11-22 LAB — BASIC METABOLIC PANEL
Anion gap: 7 (ref 5–15)
BUN: 10 mg/dL (ref 6–20)
CO2: 26 mmol/L (ref 22–32)
Calcium: 8.5 mg/dL — ABNORMAL LOW (ref 8.9–10.3)
Chloride: 107 mmol/L (ref 98–111)
Creatinine, Ser: 0.52 mg/dL (ref 0.44–1.00)
GFR, Estimated: >60 mL/min (ref >60)
Glucose, Bld: 115 mg/dL — ABNORMAL HIGH (ref 70–99)
Potassium: 3.7 mmol/L (ref 3.5–5.1)
Sodium: 140 mmol/L (ref 135–145)

## 2021-11-22 LAB — C-REACTIVE PROTEIN: CRP: 1.7 mg/dL — ABNORMAL HIGH (ref <1.0)

## 2021-11-22 LAB — SEDIMENTATION RATE: Sed Rate: 6 mm/hr (ref 0–22)

## 2021-11-22 MED ORDER — CHLORHEXIDINE GLUCONATE CLOTH 2 % EX PADS
6.0000 | MEDICATED_PAD | Freq: Every day | CUTANEOUS | Status: DC
  Administered 2021-11-23: 6 via TOPICAL

## 2021-11-22 MED ORDER — CEFAZOLIN SODIUM-DEXTROSE 2-4 GM/100ML-% IV SOLN
2.0000 g | Freq: Three times a day (TID) | INTRAVENOUS | Status: DC
  Administered 2021-11-22 – 2021-11-23 (×3): 2 g via INTRAVENOUS
  Filled 2021-11-22 (×3): qty 100

## 2021-11-22 MED ORDER — SODIUM CHLORIDE 0.9% FLUSH: 10.0000 mL | Freq: Two times a day (BID) | INTRAVENOUS | Status: DC

## 2021-11-22 MED ORDER — SODIUM CHLORIDE 0.9% FLUSH: 10.0000 mL | INTRAVENOUS | Status: DC | PRN

## 2021-11-22 MED ORDER — VANCOMYCIN HCL 1500 MG/300ML IV SOLN
1500.0000 mg | Freq: Once | INTRAVENOUS | Status: AC
  Administered 2021-11-22: 1500 mg via INTRAVENOUS
  Filled 2021-11-22: qty 300

## 2021-11-22 MED ORDER — VANCOMYCIN HCL 1250 MG/250ML IV SOLN
1250.0000 mg | Freq: Two times a day (BID) | INTRAVENOUS | Status: DC
  Administered 2021-11-23: 1250 mg via INTRAVENOUS
  Filled 2021-11-22 (×2): qty 250

## 2021-11-22 NOTE — Progress Notes (Addendum)
Physical Therapy Treatment ?Patient Details ?Name: Tiffany Leonard ?MRN: [id number] ?DOB: [birth year] ?Today's Date: 11/22/2021 ? ? ?History of Present Illness 36 y.o. female admitted 11/21/21 for reimplantation of R hip, posterior approach. Intraoperative cultures came back positive for rare gram positive cocci. PMH includes: excision of R THA 01/01/22 due to septic arthritis, I &D 09/11/20. ? ?  ?PT Comments  ? ? Pt is progressing well with mobility. She ambulated 45' with RW, no loss of balance. Stair training completed. Noted plan for PICC line placement tomorrow. Pt is mobilizing well enough to DC home, no f/u PT recommended.  ?   ?Recommendations for follow up therapy are one component of a multi-disciplinary discharge planning process, led by the attending physician.  Recommendations may be updated based on patient status, additional functional criteria and insurance authorization. ? ?Follow Up Recommendations ? Follow physician's recommendations for discharge plan and follow up therapies ?  ?  ?Assistance Recommended at Discharge Intermittent Supervision/Assistance  ?Patient can return home with the following A little help with bathing/dressing/bathroom;Assistance with cooking/housework;Assist for transportation;Help with stairs or ramp for entrance ?  ?Equipment Recommendations ? None recommended by PT  ?  ?Recommendations for Other Services   ? ? ?  ?Precautions / Restrictions Precautions ?Precautions: Posterior Hip ?Precaution Booklet Issued: Yes (comment) ?Precaution Comments: reviewed posterior precautions with pt/spouse in detail, issued handout ?Restrictions ?Weight Bearing Restrictions: No  ?  ? ?Mobility ? Bed Mobility ?Overal bed mobility: Modified Independent ?  ?  ?  ?  ?  ?  ?General bed mobility comments: HOB up, used rail ?  ? ?Transfers ?Overall transfer level: Modified independent ?Equipment used: Rolling walker (2 wheels) ?Transfers: Sit to/from Stand ?Sit to Stand: Supervision,  Modified independent (Device/Increase time) ?  ?  ?  ?  ?  ?General transfer comment: VCs hand placement ?  ? ?Ambulation/Gait ?Ambulation/Gait assistance: Modified independent (Device/Increase time) ?Gait Distance (Feet): 275 Feet ?Assistive device: Rolling walker (2 wheels) ?Gait Pattern/deviations: Step-to pattern, Decreased step length - right, Decreased step length - left ?Gait velocity: WFL ?  ?  ?General Gait Details: steady, no loss of balance, good attention to precautions with turning ? ? ?Stairs ?Stairs: Yes ?Stairs assistance: Supervision ?Stair Management: Step to pattern, One rail Right, With cane ?Number of Stairs: 4 ?  ? ? ?Wheelchair Mobility ?  ? ?Modified Rankin (Stroke Patients Only) ?  ? ? ?  ?Balance Overall balance assessment: Modified Independent ?  ?  ?  ?  ?  ?  ?  ?  ?  ?  ?  ?  ?  ?  ?  ?  ?  ?  ?  ? ?  ?Cognition Arousal/Alertness: Awake/alert ?Behavior During Therapy: [facility] for tasks assessed/performed ?Overall Cognitive Status: Within Functional Limits for tasks assessed ?  ?  ?  ?  ?  ?  ?  ?  ?  ?  ?  ?  ?  ?  ?  ?  ?  ?  ?  ? ?  ?Exercises Total Joint Exercises ?Ankle Circles/Pumps: AROM, Both, 10 reps, Supine ?Quad Sets: AROM, Right, 5 reps, Supine ?Short Arc Quad: AROM, Right, 5 reps, Supine ?Heel Slides: AAROM, Right, 5 reps, Supine ?Hip ABduction/ADduction: AAROM, Right, 5 reps, Supine ?Long Arc Quad: AROM, Right, 5 reps, Seated ? ?  ?General Comments   ?  ?  ? ?Pertinent Vitals/Pain Pain Assessment ?Pain Assessment: 0-10 ?Pain Score: 3  ?Pain Location: R hip ?Pain Descriptors / Indicators:  Sore ?Pain Intervention(s): Limited activity within patient's tolerance, Monitored during session, Premedicated before session, Ice applied  ? ? ?Home Living Family/patient expects to be discharged to:: Private residence ?Living Arrangements: Spouse/significant other;Other relatives ?Available Help at Discharge: Family;Available 24 hours/day ?Type of Home: House ?Home Access: Stairs to  enter ?Entrance Stairs-Rails: Right;Left;Can reach both ?Entrance Stairs-Number of Steps: 6 ?  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  ?Prior Function    ?  ?  ?   ? ?PT Goals (current goals can now be found in the care plan section) Acute Rehab PT Goals ?Patient Stated Goal: to be able to play with her kids ?PT Goal Formulation: With patient/family ?Time For Goal Achievement: 11/29/21 ?Potential to Achieve Goals: Good ?Progress towards PT goals: Progressing toward goals ? ?  ?Frequency ? ? ? BID ? ? ? ?  ?PT Plan Current plan remains appropriate  ? ? ?Co-evaluation   ?  ?  ?  ?  ? ?  ?AM-PAC PT "6 Clicks" Mobility   ?Outcome Measure ? Help needed turning from your back to your side while in a flat bed without using bedrails?: A Little ?Help needed moving from lying on your back to sitting on the side of a flat bed without using bedrails?: A Little ?Help needed moving to and from a bed to a chair (including a wheelchair)?: None ?Help needed standing up from a chair using your arms (e.g., wheelchair or bedside chair)?: None ?Help needed to walk in hospital room?: None ?Help needed climbing 3-5 steps with a railing? : A Little ?6 Click Score: 21 ? ?  ?End of Session Equipment Utilized During Treatment: Gait belt ?Activity Tolerance: Patient tolerated treatment well ?Patient left: in bed;with call bell/phone within reach;with family/visitor present ?Nurse Communication: Mobility status ?PT Visit Diagnosis: Difficulty in walking, not elsewhere classified (R26.2);Pain;Muscle weakness (generalized) (M62.81) ?Pain - Right/Left: Right ?Pain - part of body: Hip ?  ? ? ?Time: 1749-4496 ?PT Time Calculation (min) (ACUTE ONLY): 13 min ? ?Charges:  $Gait Training: 8-22 mins          ?          ? ?Tamala Ser PT 11/22/2021  ?Acute Rehabilitation  Services ?Pager [phone] ?Office [phone] ? ? ?

## 2021-11-22 NOTE — Consult Note (Signed)
? ? ?[facility]  ?                                                                                     ? ?Patient Identification: ?Patient Name: Tiffany Leonard MRN: [id number] [place] Date: 11/21/2021 10:09 AM ?Today's Date: 11/22/2021 ?Reason for consult: Recurrent septic arthritis  ?Requesting provider: Hector Shade ? ?Principal Problem: ?  S/P revision of total hip ? ? ?Antibiotics: None  ? ?Lines/Hardware: ? ?Assessment ?# Recurrent rt hip septic arthritis s/p multiple I and Ds and prolonged cefazolin course *2 in the past  ?S/p Right hip irrigation, debridement and antibiotic spacer exchange 4/6 with tiny bit of fluid in the hip joint.  The fluid was sent for Gram stain, culture and sensitivity. Gram stain with GPC.  ? ?Recommendations  ?-Likely MSSA recurrence. However, start vancomycin and cefazolin for now pending cultures to mature.  ?-Family has also concerns about receiving cefazolin 2 times in the past and possible failure of abtx. Plan to de-escalate pending cultures  ?-OK to place PICC for prolonged IV abtx ?-Baseline ESR and CRP ? ?Dr Linus Salmons to follow from tomorrow ? ?Rest of the management as per the primary team. Please call with questions or concerns.  ?Thank you for the consult ? ?Rosiland Oz, MD ?Infectious Disease Physician ?[facility] ?[address] ?[place], [place] [address] ?Phone: [phone]  Fax: [phone] ? ?__________________________________________________________________________________________________________ ?HPI and Hospital Course: ?36 Y O female with PMH of MSSA native rt hip septic arthritis ( treated with I and D in Jan 2022 followed by approx 6 weeks of cefazolin, EOT early March 2022) followed by recurrence of infection in May 2022 with MRI report of joint effusion and osteomyelitis s/p resection arthroplasty and   placement of antibiotic impregnated cement spacer in may 2022 + s/p 6 weeks of cefazolin , EOT 02/12/21, started on cefadroxil 02/14/22) who was admitted with a plan for reimplantation of the joint as she was doing OK with no infective concerns for several months. She was on cefadroxil until Dec 2022 but has not been taking any abtx since Jan 2023. Denies any recent fevers, chills,, sweats. Had some nausea but no vomiting, abdominal pain and diarrhea. She was also in the beach recently and felt soreness in the rt hip area but no swelling/warmth/tenderness or drainage.  ? ?She remains afebrile, mild leukocytosis of 11 ?S/p Right hip irrigation, debridement and antibiotic spacer exchange 4/6 with tiny bit of fluid in the hip joint.  The fluid was sent for Gram stain, culture and sensitivity. Gram stain with GPC.  ? ? ?ROS: all systems reviewed with pertinent positives and negatives as listed above  ? ?Past Medical History:  ?Diagnosis Date  ? Anxiety   ? Arthritis   ? ?Past Surgical History:  ?Procedure Laterality Date  ? broken pelvis  2014  ? EXCISIONAL TOTAL HIP ARTHROPLASTY WITH ANTIBIOTIC SPACERS Right 01/01/2021  ? Procedure: Right hip resection arthroplasty with antibiotic spacer;  Surgeon: Gaynelle Arabian, MD;  Location: [facility];  Service: Orthopedics;  Laterality: Right;  130min  ? INCISION AND DRAINAGE HIP Right  09/11/2020  ? Procedure: Right hip arthrotomy, irrigation and debridement-anterior approach;  Surgeon: Gaynelle Arabian, MD;  Location: [facility];  Service: Orthopedics;  Laterality: Right;  59mn  ? SHOULDER SURGERY  2014  ? ? ? ?Scheduled Meds: ? aspirin EC  325 mg Oral BID  ? docusate sodium  100 mg Oral BID  ? sertraline  50 mg Oral Daily  ? ?Continuous Infusions: ? sodium chloride Stopped (11/22/21 0926)  ? methocarbamol (ROBAXIN) IV Stopped (11/21/21 1535)  ? ?PRN Meds:.acetaminophen, ALPRAZolam, bisacodyl, HYDROcodone-acetaminophen, HYDROcodone-acetaminophen, menthol-cetylpyridinium **OR** phenol,  methocarbamol **OR** methocarbamol (ROBAXIN) IV, metoCLOPramide **OR** metoCLOPramide (REGLAN) injection, morphine injection, ondansetron **OR** ondansetron (ZOFRAN) IV, polyethylene glycol ? ?Allergies  ?Allergen Reactions  ? Banana Itching  ?  Pt reports itching in mouth and throat  ? ?Social History  ? ?Socioeconomic History  ? Marital status: Married  ?  Spouse name: Not on file  ? Number of children: Not on file  ? Years of education: Not on file  ? Highest education level: Not on file  ?Occupational History  ? Not on file  ?Tobacco Use  ? Smoking status: Never  ? Smokeless tobacco: Never  ?Vaping Use  ? Vaping Use: Never used  ?Substance and Sexual Activity  ? Alcohol use: Yes  ?  Comment: occas.  ? Drug use: Never  ? Sexual activity: Yes  ?  Birth control/protection: I.U.D.  ?Other Topics Concern  ? Not on file  ?Social History Narrative  ? Not on file  ? ?Social Determinants of Health  ? ?Financial Resource Strain: Not on file  ?Food Insecurity: Not on file  ?Transportation Needs: Not on file  ?Physical Activity: Not on file  ?Stress: Not on file  ?Social Connections: Not on file  ?Intimate Partner Violence: Not on file  ? ?Breast Cancer-relatedfamily history is not on file. ? ?Vitals ?BP 128/82 (BP Location: Left Arm)   Pulse 62   Temp 98.6 ?F (37 ?C) (Oral)   Resp 16   Ht _0  (1.778 m)   Wt 74.8 kg   LMP 11/11/2021   SpO2 99%   BMI 23.68 kg/m?  ? ? ?Physical Exam ?Constitutional:  sitting up in the bed , not in acute distress ?   Comments:  ? ?Cardiovascular:  ?   Rate and Rhythm: Normal rate and regular rhythm.  ?   Heart sounds:  ? ?Pulmonary:  ?   Effort: Pulmonary effort is normal on room air  ?   Comments:  ? ?Abdominal:  ?   Palpations: Abdomen is soft.  ?   Tenderness: non distended and non tender  ? ?Musculoskeletal:     ?   General: Rt hip lateral area is bandaged with no strikethrough C/D/I ? ?Skin: ?   Comments:  ? ?Neurological:  ?   General: Grossly non focal, awake, alert and  oriented  ? ?Psychiatric:     ?   Mood and Affect: Mood normal.  ? ?Pertinent Laboratory ? ?  Latest Ref Rng & Units 11/22/2021  ?  2:56 AM 11/08/2021  ?  9:52 AM 01/03/2021  ?  3:33 AM  ?CBC  ?WBC 4.0 - 10.5 K/uL 11.0   7.7   9.2    ?Hemoglobin 12.0 - 15.0 g/dL 10.1   13.6   10.2    ?Hematocrit 36.0 - 46.0 % 30.5   41.1   31.5    ?Platelets 150 - 400 K/uL 174   198   340    ? ? ?  Latest Ref Rng & Units 11/22/2021  ?  2:56 AM 11/08/2021  ?  9:52 AM 01/03/2021  ?  3:33 AM  ?CMP  ?Glucose 70 - 99 mg/dL 115   120   133    ?BUN 6 - 20 mg/dL _0 ?Creatinine 0.44 - 1.00 mg/dL 0.52   0.68   0.57    ?Sodium 135 - 145 mmol/L 140   136   137    ?Potassium 3.5 - 5.1 mmol/L 3.7   3.7   4.2    ?Chloride 98 - 111 mmol/L 107   104   101    ?CO2 22 - 32 mmol/L _1 ?Calcium 8.9 - 10.3 mg/dL 8.5   9.0   9.1    ?Total Protein 6.5 - 8.1 g/dL  7.4     ?Total Bilirubin 0.3 - 1.2 mg/dL  0.5     ?Alkaline Phos 38 - 126 U/L  49     ?AST 15 - 41 U/L  22     ?ALT 0 - 44 U/L  22     ? ?Pertinent Microbiology  ?Results for orders placed or performed during the hospital encounter of 11/21/21  ?Aerobic/Anaerobic Culture w Gram Stain (surgical/deep wound)     Status: None (Preliminary result)  ? Collection Time: 11/21/21 11:39 AM  ? Specimen: Synovial, Right Hip; Body Fluid  ?Result Value Ref Range Status  ? Specimen Description   Final  ?  SYNOVIAL RT HIP ?Performed at [facility], [place] [address], [place], [place] [address] ?  ? Special Requests   Final  ?  NONE ?Performed at [facility], [place] [address], [place], [place] [address] ?  ? Gram Stain   Final  ?  RARE WBC PRESENT,BOTH PMN AND MONONUCLEAR ?RARE GRAM POSITIVE COCCI ?Gram Stain Report Called to,Read Back By and Verified With: LOPEZ,J. RN _2  ON 4.5.2023 BY NMCCOY ?NO WBC SEEN ?NO ORGANISMS SEEN ?  ? Culture   Final  ?  NO GROWTH < 24 HOURS ?Performed at [facility], [place] [address] [place], [place] [address] ?  ? Report  Status PENDING  Incomplete  ? ? ?Pertinent Imagings/Other Imagings ?Plain films and CT images have been personally visualized and interpreted; radiology reports have been reviewed. Decision making incorporated into the Impress

## 2021-11-22 NOTE — Plan of Care (Signed)
?  Problem: Clinical Measurements: ?Goal: Ability to maintain clinical measurements within normal limits will improve ?Outcome: Progressing ?  ?Problem: Pain Managment: ?Goal: General experience of comfort will improve ?Outcome: Progressing ?  ?Problem: Education: ?Goal: Knowledge of the prescribed therapeutic regimen will improve ?Outcome: Progressing ?  ?

## 2021-11-22 NOTE — Progress Notes (Signed)
? ?  Subjective: ?1 Day Post-Op Procedure(s) (LRB): ?REIMPLANTATION OF TOTAL HIP (Right) ?Patient reports pain as mild.   ?Patient seen in rounds by Dr. Lequita Halt. ?Patient is well, and has had no acute complaints or problems. Denies SOB, chest pain, or calf pain. No acute overnight events. Will begin therapy today.  ? ? ? ?Objective: ?Vital signs in last 24 hours: ?Temp:  [98 ?F (36.7 ?C)-98.5 ?F (36.9 ?C)] 98.3 ?F (36.8 ?C) (04/06 0454) ?Pulse Rate:  [68-115] 68 (04/06 0454) ?Resp:  [12-18] 18 (04/06 0454) ?BP: (116-149)/(58-89) 122/58 (04/06 0454) ?SpO2:  [98 %-100 %] 100 % (04/06 0454) ?Weight:  [74.8 kg] 74.8 kg (04/05 1018) ? ?Intake/Output from previous day: ? ?Intake/Output Summary (Last 24 hours) at 11/22/2021 0747 ?Last data filed at 11/22/2021 0553 ?Gross per 24 hour  ?Intake 2839.88 ml  ?Output 1202 ml  ?Net 1637.88 ml  ?  ? ?Intake/Output this shift: ?No intake/output data recorded. ? ?Labs: ?Recent Labs  ?  11/22/21 ?0256  ?HGB 10.1*  ? ?Recent Labs  ?  11/22/21 ?0256  ?WBC 11.0*  ?RBC 3.35*  ?HCT 30.5*  ?PLT 174  ? ?Recent Labs  ?  11/22/21 ?0256  ?NA 140  ?K 3.7  ?CL 107  ?CO2 26  ?BUN 10  ?CREATININE 0.52  ?GLUCOSE 115*  ?CALCIUM 8.5*  ? ?No results for input(s): LABPT, INR in the last 72 hours. ? ?Exam: ?General - Patient is Alert and Oriented ?Extremity - Neurologically intact ?Neurovascular intact ?Intact pulses distally ?Dorsiflexion/Plantar flexion intact ?Dressing - dressing C/D/I ?Motor Function - intact, moving foot and toes well on exam.  ? ?Past Medical History:  ?Diagnosis Date  ? Anxiety   ? Arthritis   ? ? ?Assessment/Plan: ?1 Day Post-Op Procedure(s) (LRB): ?REIMPLANTATION OF TOTAL HIP (Right) ?Principal Problem: ?  S/P revision of total hip ? ?Estimated body mass index is 23.68 kg/m? as calculated from the following: ?  Height as of this encounter: 5\' 10"  (1.778 m). ?  Weight as of this encounter: 74.8 kg. ?Up with therapy ? ?DVT Prophylaxis - Aspirin and TED hose ?Weight bearing as  tolerated. ?Continue therapy. ? ?Plan is to go Home after hospital stay.  ? ?Intraoperative cultures came back positive for rare gram positive cocci. Will consult ID for appropriate antibiotic plan.  ? ?Plan for two sessions with PT today.  ? ?Patient to follow up in two weeks with Dr. in clinic.  ? ?The PDMP database was reviewed today prior to any opioid medications being prescribed to this patient.. ? ? ?Lequita Halt, MBA, PA-C ?Orthopedic Surgery ?([phone]) Nelia Shi ?11/22/2021, 7:47 AM  ?

## 2021-11-22 NOTE — Evaluation (Addendum)
Physical Therapy Evaluation ?Patient Details ?Name: Tiffany Leonard ?MRN: [id number] ?DOB: August 03, 1986 ?Today's Date: 11/22/2021 ? ?History of Present Illness ? 36 y.o. female admitted 11/21/21 for reimplantation of R hip, posterior approach. Intraoperative cultures came back positive for rare gram positive cocci. PMH includes: excision of R THA 01/01/22 due to septic arthritis, I &D 09/11/20.  ?Clinical Impression ? Pt admitted with above diagnosis. Pt ambulated 140' with RW, no loss of balance. Reviewed posterior hip precautions with pt/spouse in detail. Instructed pt in HEP. She is mobilizing well.  Pt currently with functional limitations due to the deficits listed below (see PT Problem List). Pt will benefit from skilled PT to increase their independence and safety with mobility to allow discharge to the venue listed below.   ?   ?   ? ?Recommendations for follow up therapy are one component of a multi-disciplinary discharge planning process, led by the attending physician.  Recommendations may be updated based on patient status, additional functional criteria and insurance authorization. ? ?Follow Up Recommendations Follow physician's recommendations for discharge plan and follow up therapies ? ?  ?Assistance Recommended at Discharge Intermittent Supervision/Assistance  ?Patient can return home with the following ? A little help with bathing/dressing/bathroom;Assistance with cooking/housework;Assist for transportation;Help with stairs or ramp for entrance ? ?  ?Equipment Recommendations None recommended by PT  ?Recommendations for Other Services ?    ?  ?Functional Status Assessment Patient has had a recent decline in their functional status and demonstrates the ability to make significant improvements in function in a reasonable and predictable amount of time.  ? ?  ?Precautions / Restrictions Precautions ?Precautions: Posterior Hip ?Precaution Booklet Issued: Yes (comment) ?Precaution Comments: reviewed  posterior precautions with pt/spouse in detail, issued handout ?Restrictions ?Weight Bearing Restrictions: No  ? ?  ? ?Mobility ? Bed Mobility ?Overal bed mobility: Modified Independent ?  ?  ?  ?  ?  ?  ?General bed mobility comments: HOB up, used rail ?  ? ?Transfers ?Overall transfer level: Needs assistance ?Equipment used: Rolling walker (2 wheels) ?Transfers: Sit to/from Stand ?Sit to Stand: Supervision ?  ?  ?  ?  ?  ?General transfer comment: VCs hand placement ?  ? ?Ambulation/Gait ?Ambulation/Gait assistance: Supervision ?Gait Distance (Feet): 140 Feet ?Assistive device: Rolling walker (2 wheels) ?Gait Pattern/deviations: Step-to pattern, Decreased step length - right, Decreased step length - left ?  ?  ?  ?General Gait Details: steady, no loss of balance, good attention to precautions with turning ? ?Stairs ?  ?  ?  ?  ?  ? ?Wheelchair Mobility ?  ? ?Modified Rankin (Stroke Patients Only) ?  ? ?  ? ?Balance Overall balance assessment: Modified Independent ?  ?  ?  ?  ?  ?  ?  ?  ?  ?  ?  ?  ?  ?  ?  ?  ?  ?  ?   ? ? ? ?Pertinent Vitals/Pain Pain Assessment ?Pain Assessment: 0-10 ?Pain Score: 4  ?Pain Location: R hip ?Pain Descriptors / Indicators: Sore ?Pain Intervention(s): Limited activity within patient's tolerance, Monitored during session, Premedicated before session, Ice applied  ? ? ?Home Living Family/patient expects to be discharged to:: Private residence ?Living Arrangements: Spouse/significant other;Other relatives ?Available Help at Discharge: Family;Available 24 hours/day ?Type of Home: House ?Home Access: Stairs to enter ?Entrance Stairs-Rails: Right;Left;Can reach both ?Entrance Stairs-Number of Steps: 6 ?  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  ?Prior Function Prior Level of Function : Independent/Modified  Independent ?  ?  ?  ?  ?  ?  ?Mobility Comments: independent without AD, had to use arms to lift RLE in/out of car, diffficulty with "quick movements" ?  ?  ? ? ?Hand Dominance  ?   ? ?  ?Extremity/Trunk Assessment  ? Upper Extremity Assessment ?Upper Extremity Assessment: Overall WFL for tasks assessed ?  ? ?Lower Extremity Assessment ?Lower Extremity Assessment: RLE deficits/detail ?RLE Deficits / Details: hip 2/5, hip AAROM WFL ?RLE Sensation: WNL ?RLE Coordination: WNL ?  ? ?Cervical / Trunk Assessment ?Cervical / Trunk Assessment: Normal  ?Communication  ? Communication: No difficulties  ?Cognition Arousal/Alertness: Awake/alert ?Behavior During Therapy: [facility] for tasks assessed/performed ?Overall Cognitive Status: Within Functional Limits for tasks assessed ?  ?  ?  ?  ?  ?  ?  ?  ?  ?  ?  ?  ?  ?  ?  ?  ?  ?  ?  ? ?  ?General Comments   ? ?  ?Exercises Total Joint Exercises ?Ankle Circles/Pumps: AROM, Both, 10 reps, Supine ?Quad Sets: AROM, Right, 5 reps, Supine ?Short Arc Quad: AROM, Right, 5 reps, Supine ?Heel Slides: AAROM, Right, 5 reps, Supine ?Hip ABduction/ADduction: AAROM, Right, 5 reps, Supine ?Long Arc Quad: AROM, Right, 5 reps, Seated  ? ?Assessment/Plan  ?  ?PT Assessment Patient needs continued PT services  ?PT Problem List Decreased activity tolerance;Decreased strength;Decreased mobility;Pain;Decreased range of motion ? ?   ?  ?PT Treatment Interventions Gait training;Therapeutic exercise;Patient/family education;Therapeutic activities;Functional mobility training;Stair training   ? ?PT Goals (Current goals can be found in the Care Plan section)  ?Acute Rehab PT Goals ?Patient Stated Goal: to be able to play with her kids ?PT Goal Formulation: With patient/family ?Time For Goal Achievement: 11/29/21 ?Potential to Achieve Goals: Good ? ?  ?Frequency BID ?  ? ? ?Co-evaluation   ?  ?  ?  ?  ? ? ?  ?AM-PAC PT "6 Clicks" Mobility  ?Outcome Measure Help needed turning from your back to your side while  in a flat bed without using bedrails?: A Little ?Help needed moving from lying on your back to sitting on the side of a flat bed without using bedrails?: A Little ?Help needed moving to and from a bed to a chair (including a wheelchair)?: None ?Help needed standing up from a chair using your arms (e.g., wheelchair or bedside chair)?: None ?Help needed to walk in hospital room?: None ?Help needed climbing 3-5 steps with a railing? : A Little ?6 Click Score: 21 ? ?  ?End of Session Equipment Utilized During Treatment: Gait belt ?Activity Tolerance: Patient tolerated treatment well ?Patient left: in bed;with call bell/phone within reach;with family/visitor present ?Nurse Communication: Mobility status ?PT Visit Diagnosis: Difficulty in walking, not elsewhere classified (R26.2);Pain;Muscle weakness (generalized) (M62.81) ?Pain - Right/Left: Right ?Pain - part of body: Hip ?  ? ?Time: 1610-9604 ?PT Time Calculation (min) (ACUTE ONLY): 32 min ? ? ?Charges:   PT Evaluation ?$PT Eval Moderate Complexity: 1 Mod ?PT Treatments ?$Gait Training: 8-22 mins ?  ?   ? ?Ralene Bathe Kistler PT 11/22/2021  ?Acute Rehabilitation Services ?Pager [phone] ?Office [phone] ? ? ?

## 2021-11-22 NOTE — Progress Notes (Signed)
Peripherally Inserted Central Catheter Placement ? ?The IV Nurse has discussed with the patient and/or persons authorized to consent for the patient, the purpose of this procedure and the potential benefits and risks involved with this procedure.  The benefits include less needle sticks, lab draws from the catheter, and the patient may be discharged home with the catheter. Risks include, but not limited to, infection, bleeding, blood clot (thrombus formation), and puncture of an artery; nerve damage and irregular heartbeat and possibility to perform a PICC exchange if needed/ordered by physician.  Alternatives to this procedure were also discussed.  Bard Power PICC patient education guide, fact sheet on infection prevention and patient information card has been provided to patient /or left at bedside.   ? ?PICC Placement Documentation  ?PICC Single Lumen 11/22/21 Right Brachial 37 cm 0 cm (Active)  ?Indication for Insertion or Continuance of Line Home intravenous therapies (PICC only) 11/22/21 1700  ?Exposed Catheter (cm) 0 cm 11/22/21 1700  ?Site Assessment Clean, Dry, Intact 11/22/21 1700  ?Line Status Flushed;Saline locked;Blood return noted 11/22/21 1700  ?Dressing Type Securing device;Transparent 11/22/21 1700  ?Dressing Status Antimicrobial disc in place;Clean, Dry, Intact 11/22/21 1700  ?Safety Lock Not Applicable 11/22/21 1700  ?Line Care Connections checked and tightened 11/22/21 1700  ?Dressing Intervention New dressing 11/22/21 1700  ?Dressing Change Due 11/29/21 11/22/21 1700  ? ? ? ? ? ?Franne Grip Renee ?11/22/2021, 5:14 PM ? ?

## 2021-11-22 NOTE — Progress Notes (Signed)
Pharmacy Antibiotic Note ? ?Tiffany Leonard is a 36 y.o. female admitted on 11/21/2021 for planned reimplantation of right total hip. In the operating room, hip culture showed gram positive cocci so now concern for continued infection and spacer placed instead.  Noted patient has a significant history of MSSA infection of this hip. Pharmacy has been consulted for vancomycin dosing. Scr < 1 with CrCl > 100 ml/min.  ? ?Plan: ?Vancomycin 1500 mg x 1 then 1250 mg every 12 hours  ?Predicted AUC 497 with Cmin 13 ?Also on Cefazolin 2 gm IV Q 8 hours  ?Monitor renal function, culture data ? ?Height: 5\' 10"  (177.8 cm) ?Weight: 74.8 kg (165 lb) ?IBW/kg (Calculated) : 68.5 ? ?Temp (24hrs), Avg:98.3 ?F (36.8 ?C), Min:98 ?F (36.7 ?C), Max:98.6 ?F (37 ?C) ? ?Recent Labs  ?Lab 11/22/21 ?0256  ?WBC 11.0*  ?CREATININE 0.52  ?  ?Estimated Creatinine Clearance: 107.2 mL/min (by C-G formula based on SCr of 0.52 mg/dL).   ? ?Allergies  ?Allergen Reactions  ? Banana Itching  ?  Pt reports itching in mouth and throat  ? ? ?Antimicrobials this admission: ?4/6 Cefazolin> ?4/6 Vancomycin> ? ?Microbiology results: ?4/5 rt hip culture: GPC  ? ?Thank you for allowing pharmacy to be a part of this patient?s care. ? ?6/6, PharmD, BCPS, BCIDP ?Infectious Diseases Clinical Pharmacist ?Phone: [phone] ?11/22/2021 1:06 PM ? ?

## 2021-11-22 NOTE — Op Note (Signed)
NAME: Denman, Synethia B. ?MEDICAL RECORD NO: [id number] ?ACCOUNT NO: [id number] ?DATE OF BIRTH: [birth year] ?FACILITY: [facility] ?LOCATION: [facility] ?PHYSICIAN: Gus Rankin. Haylei Cobin, MD ? ?Operative Report  ? ?DATE OF PROCEDURE: 11/21/2021 ? ?PREOPERATIVE DIAGNOSIS:  Status post resection arthroplasty and antibiotic spacer placement, right hip. ? ?POSTOPERATIVE DIAGNOSIS:  Status post resection arthroplasty and antibiotic spacer placement, right hip. ? ?PROCEDURE:  Right hip irrigation, debridement and antibiotic spacer exchange. ? ?SURGEON:  Gus Rankin. Elodia Haviland, MD ? ?ASSISTANT:  Nelia Shi, PA-C ? ?ANESTHESIA:  General. ? ?ESTIMATED BLOOD LOSS:  500 mL ? ?DRAINS:  None. ? ?COMPLICATIONS:  None. ? ?CONDITION:  Stable to recovery. ? ?BRIEF CLINICAL NOTE: Tiffany Leonard is a 36 year old female who presented with a septic arthritis of the right hip over a year ago.  She initially had irrigation, debridement and eventually had to have resection of the hip with placement of an antibiotic  ?spacer.  She did very well for several months and labs showed that her inflammatory markers had normalized.  She presents now for reimplantation of the total hip arthroplasty. ? ?DESCRIPTION OF PROCEDURE:  After successful administration of general anesthetic, the patient was placed in the left lateral decubitus position with the right side up and held with a hip positioner.  Right lower extremity was isolated from her perineum  ?with plastic drapes and prepped and draped in the usual sterile fashion.  Posterolateral incision was made with a 10 blade through subcutaneous tissue to the fascia lata, which was incised in line with the skin incision.  Sciatic nerve was palpated and  ?protected and short rotators isolated off the femur.  Capsulotomy was performed to get into the hip joint.  There was a tiny bit of fluid in the hip joint.  The fluid was sent for Gram stain, culture and sensitivity.  The hip was dislocated and then the  ?femoral head  removed.  The Prostalac stem is removed and femur then retracted anteriorly to gain acetabular exposure. ? ?The acetabular cemented component was removed and then the cement removed from the acetabular bed.  We began reaming at a 47 mm up to 51 mm.  A 52 mm Pinnacle acetabular shell was placed in anatomic position and a 32 mm neutral +4 Marathon liner was  ?placed. ? ?We then began to remove the cement from the femoral canal.  Once the cement was all removed, then I started reaming the canal, reamed up to 11.5 mm for 16 x 11 S-ROM stem.  At this time, finally an hour later, we got the Gram stain results back.   ?Unfortunately, the Gram stain did show rare gram-positive cocci. Despite the fact that the tissues all looked normal and her labs were normal, given her history of infection, we had to take this as a true positive result and thus prepared to place a new  ?spacer. ? ?I removed the acetabular liner and the acetabular shell.  We then thoroughly irrigated the whole wound bed with 3 liters of saline using pulsatile lavage.  Then, mixed two batches of gentamicin impregnated cement and cemented all-polyethylene Prostalac  ?acetabular component in anatomic position.  We then coated size 2 Summit cemented stem, coated it with the antibiotic cement.  When that all hardened, then I mixed another batch of cement, which was gentamicin impregnated cement and then hand packed the  ?femoral canal with that and cemented the femoral component into place in 20 degrees of anteversion.  When that cement hardened, we put a 32+5 metal femoral  head on the stem and then reduced and had the snap fit.  I was able to flex the hip to 90 with 70  ?of internal rotation and 70 of adduction and 90 degrees of flexion, 70 degrees adduction, 40 degrees internal rotation and she had full extension, full external rotation.  We then thoroughly irrigated further and closed the capsule with Ethibond sutures  ?through drill holes in the femur.  The  fascia lata was closed with a running 0 Stratafix suture.  Subcutaneous was closed with interrupted 2-0 Vicryl, subcuticular running 4-0 Monocryl.  Incision was cleaned and dried and Steri-Strips and sterile  ?dressing applied.  She was awakened and transported to recovery in stable condition. ? ?Note that a surgical assistant was a medical necessity for this procedure to do it in a safe and expeditious manner.  Surgical assistant was necessary for proper positioning of the femur, for safe removal of the old implant and safe and accurate  ?placement of the spacer. ? ? ?PAA ?D: 11/21/2021 2:02:38 pm T: 11/22/2021 1:51:00 am  ?JOB: [id number]/ [id number]  ?

## 2021-11-23 ENCOUNTER — Other Ambulatory Visit (HOSPITAL_COMMUNITY): Payer: Self-pay

## 2021-11-23 DIAGNOSIS — M00859 Arthritis due to other bacteria, unspecified hip: Secondary | ICD-10-CM

## 2021-11-23 DIAGNOSIS — M009 Pyogenic arthritis, unspecified: Secondary | ICD-10-CM | POA: Diagnosis not present

## 2021-11-23 LAB — CBC
HCT: 28.6 % — ABNORMAL LOW (ref 36.0–46.0)
Hemoglobin: 9.3 g/dL — ABNORMAL LOW (ref 12.0–15.0)
MCH: 30.5 pg (ref 26.0–34.0)
MCHC: 32.5 g/dL (ref 30.0–36.0)
MCV: 93.8 fL (ref 80.0–100.0)
Platelets: 143 10*3/uL — ABNORMAL LOW (ref 150–400)
RBC: 3.05 MIL/uL — ABNORMAL LOW (ref 3.87–5.11)
RDW: 14.2 % (ref 11.5–15.5)
WBC: 9.4 10*3/uL (ref 4.0–10.5)
nRBC: 0 % (ref 0.0–0.2)

## 2021-11-23 LAB — BASIC METABOLIC PANEL
Anion gap: 6 (ref 5–15)
BUN: 10 mg/dL (ref 6–20)
CO2: 27 mmol/L (ref 22–32)
Calcium: 8.4 mg/dL — ABNORMAL LOW (ref 8.9–10.3)
Chloride: 105 mmol/L (ref 98–111)
Creatinine, Ser: 0.47 mg/dL (ref 0.44–1.00)
GFR, Estimated: >60 mL/min (ref >60)
Glucose, Bld: 121 mg/dL — ABNORMAL HIGH (ref 70–99)
Potassium: 3.7 mmol/L (ref 3.5–5.1)
Sodium: 138 mmol/L (ref 135–145)

## 2021-11-23 LAB — CK: Total CK: 216 U/L (ref 38–234)

## 2021-11-23 MED ORDER — METHOCARBAMOL 500 MG PO TABS
500.0000 mg | ORAL_TABLET | Freq: Four times a day (QID) | ORAL | 0 refills | Status: DC | PRN
  Filled 2021-11-23: qty 40, 10d supply, fill #0

## 2021-11-23 MED ORDER — ASPIRIN 325 MG PO TBEC
325.0000 mg | DELAYED_RELEASE_TABLET | Freq: Two times a day (BID) | ORAL | 0 refills | Status: AC
  Filled 2021-11-23: qty 38, 19d supply, fill #0

## 2021-11-23 MED ORDER — DAPTOMYCIN IV (FOR PTA / DISCHARGE USE ONLY): 600.0000 mg | INTRAVENOUS | 0 refills | Status: AC

## 2021-11-23 MED ORDER — SODIUM CHLORIDE 0.9 % IV SOLN
8.0000 mg/kg | Freq: Every day | INTRAVENOUS | Status: DC
  Administered 2021-11-23: 600 mg via INTRAVENOUS
  Filled 2021-11-23: qty 12

## 2021-11-23 MED ORDER — HEPARIN SOD (PORK) LOCK FLUSH 100 UNIT/ML IV SOLN
250.0000 [IU] | INTRAVENOUS | Status: AC | PRN
  Administered 2021-11-23: 250 [IU]
  Filled 2021-11-23: qty 2.5

## 2021-11-23 MED ORDER — HYDROCODONE-ACETAMINOPHEN 5-325 MG PO TABS
1.0000 | ORAL_TABLET | Freq: Four times a day (QID) | ORAL | 0 refills | Status: DC | PRN
  Filled 2021-11-23: qty 42, 6d supply, fill #0

## 2021-11-23 NOTE — Progress Notes (Signed)
PHARMACY CONSULT NOTE FOR: ? ?OUTPATIENT  PARENTERAL ANTIBIOTIC THERAPY (OPAT) ? ?Indication: Recurrent right hip septic arthritis  ?Regimen: Daptomycin 600 mg every 24 hours   ?End date: 01/03/22  ? ?IV antibiotic discharge orders are pended. ?To discharging provider:  please sign these orders via discharge navigator,  ?Select New Orders & click on the button choice - Manage This Unsigned Work.  ?  ? ?Thank you for allowing pharmacy to be a part of this patient's care. ? ?Jimmy Footman, PharmD, BCPS, BCIDP ?Infectious Diseases Clinical Pharmacist ?Phone: [phone] ?11/23/2021, 10:44 AM ? ?

## 2021-11-23 NOTE — Plan of Care (Signed)
  Problem: Pain Managment: Goal: General experience of comfort will improve Outcome: Progressing   

## 2021-11-23 NOTE — Progress Notes (Signed)
Discharge package printed and instructions given to patient. Patient verbalizes understanding. 

## 2021-11-23 NOTE — TOC Transition Note (Signed)
Transition of Care (TOC) - CM/SW Discharge Note ? ? ?Patient Details  ?Name: Tiffany Leonard ?MRN: [id number] ?Date of Birth: February 08, 1986 ? ?Transition of Care (TOC) CM/SW Contact:  ?Tilmon Wisehart, LCSW ?Phone Number: ?11/23/2021, 2:00 PM ? ? ?Clinical Narrative:    ?Pt medically ready for dc home today.  Pt agreeable with plan for ongoing IV abx at home and without any [facility] agency preference.  Referral place with [facility] Carolynn Sayers, RN) to manage antibiotics and [facility] to provide [facility] visits.  No further TOC needs. ? ? ?Final next level of care: [place] ?Barriers to Discharge: No Barriers Identified ? ? ?Patient Goals and CMS Choice ?Patient states their goals for this hospitalization and ongoing recovery are:: return home ?  ?  ? ?Discharge Placement ?  ?           ?  ?  ?  ?  ? ?Discharge Plan and Services ?  ?  ?           ?DME Arranged: N/A ?DME Agency: NA ?  ?  ?  ?HH Arranged: RN, IV Antibiotics ?[place] Agency: [employer] forces training [employer] education officer) ?Date HH Agency Contacted: 11/22/21 ?  ?Representative spoke with at [place]: Carolynn Sayers ? ?Social Determinants of Health (SDOH) Interventions ?  ? ? ?Readmission Risk Interventions ? ?  09/12/2020  ?  3:28 PM  ?Readmission Risk Prevention Plan  ?Post Dischage Appt Complete  ?Medication Screening Complete  ?Transportation Screening Complete  ? ? ? ? ? ?

## 2021-11-23 NOTE — Progress Notes (Addendum)
?  [facility] for Infectious Disease ? ? ?Reason for visit: Follow up on septic arthritis ? ?Interval History: s/p resection arthroplasty last year and now with irrigation and debridement after aborted implantation due to positive gram stain with antibiotic spacer exchange.  ?Micro: gram stain with GPC; no growth in culture to date ? ?Physical Exam: ?Constitutional:  ?Vitals:  ? 11/22/21 1959 11/23/21 0503  ?BP: 122/69 119/67  ?Pulse: 88 (!) 53  ?Resp: 18 18  ?Temp: 98.4 ?F (36.9 ?C) 97.6 ?F (36.4 ?C)  ?SpO2: 99% 99%  ? patient appears in NAD ?Respiratory: Normal respiratory effort; CTA B ?Cardiovascular: RRR ? ? ?Review of Systems: ?Constitutional: negative for fevers and chills ? ?Lab Results  ?Component Value Date  ? WBC 9.4 11/23/2021  ? HGB 9.3 (L) 11/23/2021  ? HCT 28.6 (L) 11/23/2021  ? MCV 93.8 11/23/2021  ? PLT 143 (L) 11/23/2021  ?  ?Lab Results  ?Component Value Date  ? CREATININE 0.47 11/23/2021  ? BUN 10 11/23/2021  ? NA 138 11/23/2021  ? K 3.7 11/23/2021  ? CL 105 11/23/2021  ? CO2 27 11/23/2021  ?  ?Lab Results  ?Component Value Date  ? ALT 22 11/08/2021  ? AST 22 11/08/2021  ? ALKPHOS 49 11/08/2021  ?  ? ?Microbiology: ?Recent Results (from the past 240 hour(s))  ?Aerobic/Anaerobic Culture w Gram Stain (surgical/deep wound)     Status: None (Preliminary result)  ? Collection Time: 11/21/21 11:39 AM  ? Specimen: Synovial, Right Hip; Body Fluid  ?Result Value Ref Range Status  ? Specimen Description   Final  ?  SYNOVIAL RT HIP ?Performed at [facility], [place] [address], [place], [place] [address] ?  ? Special Requests   Final  ?  NONE ?Performed at [facility], [place] [address], [place], [place] [address] ?  ? Gram Stain   Final  ?  RARE WBC PRESENT,BOTH PMN AND MONONUCLEAR ?RARE GRAM POSITIVE COCCI ?Gram Stain Report Called to,Read Back By and Verified With: LOPEZ,J. RN '@1248'  ON 4.5.2023 BY NMCCOY ?NO WBC SEEN ?NO ORGANISMS SEEN ?  ? Culture   Final  ?  NO  GROWTH < 24 HOURS ?Performed at [facility], [place] [address], [place], [place] [address] ?  ? Report Status PENDING  Incomplete  ?Acid Fast Smear (AFB)     Status: None  ? Collection Time: 11/21/21 11:39 AM  ? Specimen: Synovial, Right Hip; Body Fluid  ?Result Value Ref Range Status  ? AFB Specimen Processing Concentration  Final  ? Acid Fast Smear Negative  Final  ?  Comment: (NOTE) ?Performed At: [place] ?[address] [place], [place] [id number] ?Rush Farmer MD [id number] ?  ? Source (AFB) SYNOVIAL  Final  ?  Comment: RT HIP ?Performed at [facility], [place] [address] [place],  [address] ?  ? ? ?Impression/Plan:  ?1. Septic arthritis, persistent - gram stain positive at time of planned reimplantation so no total hip arthroplasty done and plan for prolonged IV antibiotics targeting known organism but will use IV daptomycin with concern for the potential of resistance after prolonged cefazolin.  If cultures grow out some, this can be changed back to cefazolin as an outpatient, if indicated.  Plan for 6 weeks.   ? ?2.  Medication monitoring - will monitor CK weekly along with cbc, cmp per home health.  ? ?3.  Access - picc line in place.   ? ?Diagnosis: ?Septic arthritis  with resection of native femoral head last year; s/p irrigation and debridement with spacer exchange ? ?Culture Result: previous MSSA; GPC on current gram stain with no culture growth ? ?Allergies  ?Allergen Reactions  ? Banana Itching  ?  Pt reports itching in mouth and throat  ? ? ?OPAT Orders ?Discharge antibiotics to be given via PICC line ?Discharge antibiotics: datptomycin 82m/kg once daily IV ?Per pharmacy protocol yes ?Duration: ?6 weeks ?End Date: ?01/03/22 ? ?[facility] yes ? ?Home health RN for IV administration and teaching; PICC line care and labs.   ? ?Labs weekly while on IV antibiotics: ?__x CBC with differential ?__ BMP ?__x CMP ?__x CRP ?__x ESR ?__ Vancomycin  trough ?_x_ CK ? ?_x_ Please pull PIC at completion of IV antibiotics ?__ Please leave PIC in place until doctor has seen patient or been notified ? ?Fax weekly labs to ([phone]? ?Clinic Follow Up Appt: ?12/25/21 ? ?@ 11am with Dr. CLinus Salmons? ?  ?

## 2021-11-23 NOTE — Progress Notes (Signed)
? ?  Subjective: ?2 Days Post-Op Procedure(s) (LRB): ?REIMPLANTATION OF TOTAL HIP (Right) ?Patient reports pain as mild.   ?Patient seen in rounds by Dr. Lequita Halt. ?Patient is well, and has had no acute complaints or problems. States she is ready to go home. Pain in hip well controlled. Denies any chest pain or Sob. Voiding without difficulty.  ?Plan is to go Home after hospital stay. ? ?Objective: ?Vital signs in last 24 hours: ?Temp:  [97.6 ?F (36.4 ?C)-98.6 ?F (37 ?C)] 97.6 ?F (36.4 ?C) (04/07 0503) ?Pulse Rate:  [53-88] 53 (04/07 0503) ?Resp:  [16-18] 18 (04/07 0503) ?BP: (119-128)/(67-97) 119/67 (04/07 0503) ?SpO2:  [99 %] 99 % (04/07 0503) ? ?Intake/Output from previous day: ? ?Intake/Output Summary (Last 24 hours) at 11/23/2021 0740 ?Last data filed at 11/23/2021 0604 ?Gross per 24 hour  ?Intake 1219.38 ml  ?Output --  ?Net 1219.38 ml  ?  ?Intake/Output this shift: ?No intake/output data recorded. ? ?Labs: ?Recent Labs  ?  11/22/21 ?0256 11/23/21 ?0325  ?HGB 10.1* 9.3*  ? ?Recent Labs  ?  11/22/21 ?0256 11/23/21 ?0325  ?WBC 11.0* 9.4  ?RBC 3.35* 3.05*  ?HCT 30.5* 28.6*  ?PLT 174 143*  ? ?Recent Labs  ?  11/22/21 ?0256 11/23/21 ?0325  ?NA 140 138  ?K 3.7 3.7  ?CL 107 105  ?CO2 26 27  ?BUN 10 10  ?CREATININE 0.52 0.47  ?GLUCOSE 115* 121*  ?CALCIUM 8.5* 8.4*  ? ?No results for input(s): LABPT, INR in the last 72 hours. ? ?Exam: ?General - Patient is Alert and Oriented ?Extremity - Neurologically intact ?Neurovascular intact ?Sensation intact distally ?Dorsiflexion/Plantar flexion intact ?Dressing/Incision - clean, dry, no drainage ?Motor Function - intact, moving foot and toes well on exam.  ? ?Past Medical History:  ?Diagnosis Date  ? Anxiety   ? Arthritis   ? ? ?Assessment/Plan: ?2 Days Post-Op Procedure(s) (LRB): ?REIMPLANTATION OF TOTAL HIP (Right) ?Principal Problem: ?  S/P revision of total hip ? ?Estimated body mass index is 23.68 kg/m? as calculated from the following: ?  Height as of this encounter: 5\' 10"   (1.778 m). ?  Weight as of this encounter: 74.8 kg. ?Up with therapy ? ?DVT Prophylaxis - Aspirin ?Weight-bearing as tolerated ? ?Intraop cultures showing no growth at 24 hours. ? ?PICC line placed yesterday. ?Awaiting final ID recs so OPAT orders can be placed with pharmacy. ? ?Discharge today once abx recs finalized. ?HEP ?Follow-up in the office in 2 weeks ? ?The PDMP database was reviewed today prior to any opioid medications being prescribed to this patient. ? ? , PA-C ?Orthopedic Surgery ?([phone]) Arther Abbott ?11/23/2021, 7:40 AM ? ?

## 2021-11-24 DIAGNOSIS — M009 Pyogenic arthritis, unspecified: Secondary | ICD-10-CM | POA: Diagnosis not present

## 2021-11-25 DIAGNOSIS — M009 Pyogenic arthritis, unspecified: Secondary | ICD-10-CM | POA: Diagnosis not present

## 2021-11-26 ENCOUNTER — Telehealth: Payer: Self-pay

## 2021-11-26 DIAGNOSIS — A327 Listerial sepsis: Secondary | ICD-10-CM | POA: Diagnosis not present

## 2021-11-26 DIAGNOSIS — M009 Pyogenic arthritis, unspecified: Secondary | ICD-10-CM | POA: Diagnosis not present

## 2021-11-26 LAB — AEROBIC/ANAEROBIC CULTURE W GRAM STAIN (SURGICAL/DEEP WOUND): Culture: NO GROWTH

## 2021-11-26 NOTE — Telephone Encounter (Signed)
Patient wanted to know if there is any update on cultures. She is also asking that if there is no growth, does that mean it was a false positive. Will route to provider.  ? ?Sandie Ano, RN ? ?

## 2021-11-27 DIAGNOSIS — M009 Pyogenic arthritis, unspecified: Secondary | ICD-10-CM | POA: Diagnosis not present

## 2021-11-27 DIAGNOSIS — Z96649 Presence of unspecified artificial hip joint: Secondary | ICD-10-CM | POA: Diagnosis not present

## 2021-11-27 NOTE — Telephone Encounter (Signed)
Called patient to relay Dr. Ephriam Knuckles message, no answer. Left HIPAA compliant voicemail requesting callback.  ? ?Sandie Ano, RN ? ?

## 2021-11-27 NOTE — Telephone Encounter (Signed)
Patient aware of results, continue IV Daptomycin as prescribed and follow up with Dr.Comer on 5/9. ? ? ? ?Tiyon Sanor P Melvern Ramone, CMA ? ?

## 2021-11-27 NOTE — Discharge Summary (Signed)
Physician Discharge Summary  ? ?Patient ID: ?Tiffany Leonard ?MRN: [id number] ?DOB/AGE: [birth year] 36 y.o. ? ?Admit date: 11/21/2021 ?Discharge date: 11/23/2021 ? ?Primary Diagnosis: Status post resection arthroplasty and antibiotic spacer placement, right hip  ? ?Admission Diagnoses:  ?Past Medical History:  ?Diagnosis Date  ? Anxiety   ? Arthritis   ? ?Discharge Diagnoses:   ?Principal Problem: ?  S/P revision of total hip ? ?Estimated body mass index is 23.68 kg/m? as calculated from the following: ?  Height as of this encounter: 5\' 10"  (1.778 m). ?  Weight as of this encounter: 74.8 kg. ? ?Procedure:  ?Procedure(s) (LRB): ?REIMPLANTATION OF TOTAL HIP (Right)  ? ?Consults: ID ? ?HPI: Tiffany Leonard is a 36 year old female who presented with a septic arthritis of the right hip over a year ago.  She initially had irrigation, debridement and eventually had to have resection of the hip with placement of an antibiotic  ?spacer.  She did very well for several months and labs showed that her inflammatory markers had normalized.  She presents now for reimplantation of the total hip arthroplasty. ?  ? ?Laboratory Data: ?Admission on 11/21/2021, Discharged on 11/23/2021  ?Component Date Value Ref Range Status  ? Preg Test, Ur 11/21/2021 NEGATIVE  NEGATIVE Final  ? Comment:        ?THE SENSITIVITY OF THIS ?METHODOLOGY IS >20 mIU/mL. ?Performed at [facility], [address] [place], [place] ?  ? Specimen Description 11/21/2021    Final  ?                 Value:SYNOVIAL RT HIP ?Performed at [facility], [address] [place], [place] ?  ? Special Requests 11/21/2021    Final  ?                 Value:NONE ?Performed at [facility], [address] [place], [place] ?  ? Gram Stain 11/21/2021    Final  ?                 Value:RARE WBC PRESENT,BOTH PMN AND MONONUCLEAR ?RARE GRAM POSITIVE COCCI ?Gram Stain Report Called to,Read Back By  and Verified With: LOPEZ,J. RN @1248  ON 4.5.2023 BY NMCCOY ?NO WBC SEEN ?NO ORGANISMS SEEN ?  ? Culture 11/21/2021    Final  ?                 Value:No growth aerobically or anaerobically. ?Performed at [facility], [address], [place], [address] [place] ?  ? Report Status 11/21/2021 11/26/2021 FINAL   Final  ? AFB Specimen Processing 11/21/2021 Concentration   Final  ? Acid Fast Smear 11/21/2021 Negative   Final  ? Comment: (NOTE) ?Performed At: [facility] ?[address] [place], [address] [place] ?[place] [id number] ?  ? Source (AFB) 11/21/2021 SYNOVIAL   Final  ? Comment: RT HIP ?Performed at [facility], [address] [place], [place] ?  ? WBC 11/22/2021 11.0 (H)  4.0 - 10.5 K/uL Final  ? RBC 11/22/2021 3.35 (L)  3.87 - 5.11 MIL/uL Final  ? Hemoglobin 11/22/2021 10.1 (L)  12.0 - 15.0 g/dL Final  ? HCT 01/22/2022 30.5 (L)  36.0 - 46.0 % Final  ? MCV 11/22/2021 91.0  80.0 - 100.0 fL Final  ? MCH 11/22/2021 30.1  26.0 - 34.0 pg Final  ? MCHC 11/22/2021 33.1  30.0 - 36.0 g/dL Final  ?  RDW 11/22/2021 13.9  11.5 - 15.5 % Final  ? Platelets 11/22/2021 174  150 - 400 K/uL Final  ? nRBC 11/22/2021 0.0  0.0 - 0.2 % Final  ? Performed at [facility], [address] [place], [place] [address]  ? Sodium 11/22/2021 140  135 - 145 mmol/L Final  ? Potassium 11/22/2021 3.7  3.5 - 5.1 mmol/L Final  ? Chloride 11/22/2021 107  98 - 111 mmol/L Final  ? CO2 11/22/2021 26  22 - 32 mmol/L Final  ? Glucose, Bld 11/22/2021 115 (H)  70 - 99 mg/dL Final  ? Glucose reference range applies only to samples taken after fasting for at least 8 hours.  ? BUN 11/22/2021 10  6 - 20 mg/dL Final  ? Creatinine, Ser 11/22/2021 0.52  0.44 - 1.00 mg/dL Final  ? Calcium 34/19/6222 8.5 (L)  8.9 - 10.3 mg/dL Final  ? GFR, Estimated 11/22/2021 >60  >60 mL/min Final  ? Comment: (NOTE) ?Calculated using the CKD-EPI Creatinine Equation (2021) ?  ? Anion gap 11/22/2021 7  5  - 15 Final  ? Performed at [facility], [address] [place], [place] [address]  ? Sed Rate 11/22/2021 6  0 - 22 mm/hr Final  ? Performed at [facility], [address] [place], [place] [address]  ? CRP 11/22/2021 1.7 (H)  <1.0 mg/dL Final  ? Performed at [facility], [address] [place], [place] [address]  ? WBC 11/23/2021 9.4  4.0 - 10.5 K/uL Final  ? RBC 11/23/2021 3.05 (L)  3.87 - 5.11 MIL/uL Final  ? Hemoglobin 11/23/2021 9.3 (L)  12.0 - 15.0 g/dL Final  ? HCT 14/48/1856 28.6 (L)  36.0 - 46.0 % Final  ? MCV 11/23/2021 93.8  80.0 - 100.0 fL Final  ? MCH 11/23/2021 30.5  26.0 - 34.0 pg Final  ? MCHC 11/23/2021 32.5  30.0 - 36.0 g/dL Final  ? RDW 31/49/7026 14.2  11.5 - 15.5 % Final  ? Platelets 11/23/2021 143 (L)  150 - 400 K/uL Final  ? nRBC 11/23/2021 0.0  0.0 - 0.2 % Final  ? Performed at [facility], [address] [place], [place] [address]  ? Sodium 11/23/2021 138  135 - 145 mmol/L Final  ? Potassium 11/23/2021 3.7  3.5 - 5.1 mmol/L Final  ? Chloride 11/23/2021 105  98 - 111 mmol/L Final  ? CO2 11/23/2021 27  22 - 32 mmol/L Final  ? Glucose, Bld 11/23/2021 121 (H)  70 - 99 mg/dL Final  ? Glucose reference range applies only to samples taken after fasting for at least 8 hours.  ? BUN 11/23/2021 10  6 - 20 mg/dL Final  ? Creatinine, Ser 11/23/2021 0.47  0.44 - 1.00 mg/dL Final  ? Calcium 85/09/7739 8.4 (L)  8.9 - 10.3 mg/dL Final  ? GFR, Estimated 11/23/2021 >60  >60 mL/min Final  ? Comment: (NOTE) ?Calculated using the CKD-EPI Creatinine Equation (2021) ?  ? Anion gap 11/23/2021 6  5 - 15 Final  ? Performed at [facility], [address], [place], [place] [address]  ? Total CK 11/23/2021 216  38 - 234 U/L Final  ? Performed at [facility], [address], [place], [place] [address]  ?Hospital Outpatient Visit on 11/08/2021  ?Component Date Value Ref Range Status  ? MRSA, PCR 11/08/2021 NEGATIVE   NEGATIVE Final  ? Staphylococcus aureus 11/08/2021 NEGATIVE  NEGATIVE  Final  ? Comment: (NOTE) ?The Xpert SA Assay (FDA approved for NASAL specimens in patients 6922 ?years of age and older), is one component of a comprehensive ?surveillance program. It is not intended to diagnose infection nor to ?guide or monitor treatment. ?Performed at [facility], [address] ?[place], [place] [address] ?  ? WBC 11/08/2021 7.7  4.0 - 10.5 K/uL Final  ? RBC 11/08/2021 4.53  3.87 - 5.11 MIL/uL Final  ? Hemoglobin 11/08/2021 13.6  12.0 - 15.0 g/dL Final  ? HCT 60/45/409803/23/2023 41.1  36.0 - 46.0 % Final  ? MCV 11/08/2021 90.7  80.0 - 100.0 fL Final  ? MCH 11/08/2021 30.0  26.0 - 34.0 pg Final  ? MCHC 11/08/2021 33.1  30.0 - 36.0 g/dL Final  ? RDW 11/91/478203/23/2023 13.7  11.5 - 15.5 % Final  ? Platelets 11/08/2021 198  150 - 400 K/uL Final  ? nRBC 11/08/2021 0.0  0.0 - 0.2 % Final  ? Performed at [facility], [address] [place], [place] [address]  ? Sodium 11/08/2021 136  135 - 145 mmol/L Final  ? Potassium 11/08/2021 3.7  3.5 - 5.1 mmol/L Final  ? Chloride 11/08/2021 104  98 - 111 mmol/L Final  ? CO2 11/08/2021 26  22 - 32 mmol/L Final  ? Glucose, Bld 11/08/2021 120 (H)  70 - 99 mg/dL Final  ? Glucose reference range applies only to samples taken after fasting for at least 8 hours.  ? BUN 11/08/2021 20  6 - 20 mg/dL Final  ? Creatinine, Ser 11/08/2021 0.68  0.44 - 1.00 mg/dL Final  ? Calcium 30/86/578403/23/2023 9.0  8.9 - 10.3 mg/dL Final  ? Total Protein 11/08/2021 7.4  6.5 - 8.1 g/dL Final  ? Albumin 69/62/952803/23/2023 4.1  3.5 - 5.0 g/dL Final  ? AST 41/32/440103/23/2023 22  15 - 41 U/L Final  ? ALT 11/08/2021 22  0 - 44 U/L Final  ? Alkaline Phosphatase 11/08/2021 49  38 - 126 U/L Final  ? Total Bilirubin 11/08/2021 0.5  0.3 - 1.2 mg/dL Final  ? GFR, Estimated 11/08/2021 >60  >60 mL/min Final  ? Comment: (NOTE) ?Calculated using the CKD-EPI Creatinine Equation (2021) ?  ? Anion gap 11/08/2021 6  5 - 15 Final  ?  Performed at [facility], [address] [place], [place] [address]  ? ABO/RH(D) 11/08/2021 AB NEG   Final  ? Antibody Screen 11/08/2021 NEG   Final  ? Sample Expiration 11/08/2021 04/06

## 2021-11-28 DIAGNOSIS — M009 Pyogenic arthritis, unspecified: Secondary | ICD-10-CM | POA: Diagnosis not present

## 2021-11-29 DIAGNOSIS — M009 Pyogenic arthritis, unspecified: Secondary | ICD-10-CM | POA: Diagnosis not present

## 2021-11-30 DIAGNOSIS — M009 Pyogenic arthritis, unspecified: Secondary | ICD-10-CM | POA: Diagnosis not present

## 2021-12-01 DIAGNOSIS — M009 Pyogenic arthritis, unspecified: Secondary | ICD-10-CM | POA: Diagnosis not present

## 2021-12-02 DIAGNOSIS — M009 Pyogenic arthritis, unspecified: Secondary | ICD-10-CM | POA: Diagnosis not present

## 2021-12-03 DIAGNOSIS — M009 Pyogenic arthritis, unspecified: Secondary | ICD-10-CM | POA: Diagnosis not present

## 2021-12-04 ENCOUNTER — Encounter: Payer: Self-pay | Admitting: Internal Medicine

## 2021-12-04 DIAGNOSIS — M009 Pyogenic arthritis, unspecified: Secondary | ICD-10-CM | POA: Diagnosis not present

## 2021-12-04 DIAGNOSIS — M00849 Arthritis due to other bacteria, unspecified hand: Secondary | ICD-10-CM | POA: Diagnosis not present

## 2021-12-05 DIAGNOSIS — M009 Pyogenic arthritis, unspecified: Secondary | ICD-10-CM | POA: Diagnosis not present

## 2021-12-06 DIAGNOSIS — M009 Pyogenic arthritis, unspecified: Secondary | ICD-10-CM | POA: Diagnosis not present

## 2021-12-07 DIAGNOSIS — M009 Pyogenic arthritis, unspecified: Secondary | ICD-10-CM | POA: Diagnosis not present

## 2021-12-08 DIAGNOSIS — M009 Pyogenic arthritis, unspecified: Secondary | ICD-10-CM | POA: Diagnosis not present

## 2021-12-09 DIAGNOSIS — M009 Pyogenic arthritis, unspecified: Secondary | ICD-10-CM | POA: Diagnosis not present

## 2021-12-10 DIAGNOSIS — M009 Pyogenic arthritis, unspecified: Secondary | ICD-10-CM | POA: Diagnosis not present

## 2021-12-11 DIAGNOSIS — A327 Listerial sepsis: Secondary | ICD-10-CM | POA: Diagnosis not present

## 2021-12-11 DIAGNOSIS — M00849 Arthritis due to other bacteria, unspecified hand: Secondary | ICD-10-CM | POA: Diagnosis not present

## 2021-12-11 DIAGNOSIS — M009 Pyogenic arthritis, unspecified: Secondary | ICD-10-CM | POA: Diagnosis not present

## 2021-12-12 DIAGNOSIS — M009 Pyogenic arthritis, unspecified: Secondary | ICD-10-CM | POA: Diagnosis not present

## 2021-12-13 DIAGNOSIS — M009 Pyogenic arthritis, unspecified: Secondary | ICD-10-CM | POA: Diagnosis not present

## 2021-12-14 DIAGNOSIS — M009 Pyogenic arthritis, unspecified: Secondary | ICD-10-CM | POA: Diagnosis not present

## 2021-12-15 DIAGNOSIS — M009 Pyogenic arthritis, unspecified: Secondary | ICD-10-CM | POA: Diagnosis not present

## 2021-12-16 DIAGNOSIS — M009 Pyogenic arthritis, unspecified: Secondary | ICD-10-CM | POA: Diagnosis not present

## 2021-12-17 DIAGNOSIS — M009 Pyogenic arthritis, unspecified: Secondary | ICD-10-CM | POA: Diagnosis not present

## 2021-12-18 ENCOUNTER — Encounter: Payer: Self-pay | Admitting: Internal Medicine

## 2021-12-18 DIAGNOSIS — M009 Pyogenic arthritis, unspecified: Secondary | ICD-10-CM | POA: Diagnosis not present

## 2021-12-18 DIAGNOSIS — A327 Listerial sepsis: Secondary | ICD-10-CM | POA: Diagnosis not present

## 2021-12-18 DIAGNOSIS — M00849 Arthritis due to other bacteria, unspecified hand: Secondary | ICD-10-CM | POA: Diagnosis not present

## 2021-12-19 DIAGNOSIS — M009 Pyogenic arthritis, unspecified: Secondary | ICD-10-CM | POA: Diagnosis not present

## 2021-12-20 DIAGNOSIS — M009 Pyogenic arthritis, unspecified: Secondary | ICD-10-CM | POA: Diagnosis not present

## 2021-12-21 DIAGNOSIS — M009 Pyogenic arthritis, unspecified: Secondary | ICD-10-CM | POA: Diagnosis not present

## 2021-12-22 DIAGNOSIS — M009 Pyogenic arthritis, unspecified: Secondary | ICD-10-CM | POA: Diagnosis not present

## 2021-12-23 DIAGNOSIS — M009 Pyogenic arthritis, unspecified: Secondary | ICD-10-CM | POA: Diagnosis not present

## 2021-12-24 DIAGNOSIS — M009 Pyogenic arthritis, unspecified: Secondary | ICD-10-CM | POA: Diagnosis not present

## 2021-12-25 ENCOUNTER — Other Ambulatory Visit: Payer: Self-pay

## 2021-12-25 ENCOUNTER — Encounter: Payer: Self-pay | Admitting: Internal Medicine

## 2021-12-25 ENCOUNTER — Ambulatory Visit (INDEPENDENT_AMBULATORY_CARE_PROVIDER_SITE_OTHER): Payer: BC Managed Care – PPO | Admitting: Internal Medicine

## 2021-12-25 ENCOUNTER — Telehealth: Payer: Self-pay

## 2021-12-25 VITALS — BP 126/81 | HR 69 | Temp 98.0°F | Wt 173.0 lb

## 2021-12-25 DIAGNOSIS — Z5181 Encounter for therapeutic drug level monitoring: Secondary | ICD-10-CM | POA: Diagnosis not present

## 2021-12-25 DIAGNOSIS — M00051 Staphylococcal arthritis, right hip: Secondary | ICD-10-CM

## 2021-12-25 DIAGNOSIS — Z452 Encounter for adjustment and management of vascular access device: Secondary | ICD-10-CM

## 2021-12-25 DIAGNOSIS — A327 Listerial sepsis: Secondary | ICD-10-CM | POA: Diagnosis not present

## 2021-12-25 DIAGNOSIS — M009 Pyogenic arthritis, unspecified: Secondary | ICD-10-CM | POA: Diagnosis not present

## 2021-12-25 HISTORY — DX: Encounter for adjustment and management of vascular access device: Z45.2

## 2021-12-25 NOTE — Assessment & Plan Note (Signed)
Doing well again on treatment, now with daptomycin due to potential recurrence of infection after treatment with cefazolin x 2.  Plan to continue with daptomycin through 5/18 and stop and observe off of antibiotics.  ?Reimplantation timing per Dr. Maureen Ralphs.   ?Will notify home health.   ?Follow up as needed.  ?

## 2021-12-25 NOTE — Progress Notes (Signed)
? ?  Subjective:  ? ? Patient ID: Tiffany Leonard, female    DOB: [birth year], 36 y.o.   MRN: [id number] ? ?HPI ?Here for follow up of a prosthetic joint infection ?She has a history of right native septic arthritis s/p I and D in January 2022 and treated with prolonged IV cefazolin for MSSA infection followed by recurrence of infection in May 2022 after implantation s/p resection arthroplasty in May 2022 with spacer placement and retreatement with IV to oral and treated until December 2022.  She came back for planned reimplantation in April 2023 off antibiotics since December but unfortunatly the intra operative gram stain was positive for GPC and the reimplantation was aborted.  She was seen by [place] in consultation and with no new culture growth started on daptomycin and on this for a planned 6 weeks through 01/03/22.  Culture remained negative and she is tolerating daptomycin well.  ESR and CRP on 5/2 wnl, CK remains wnl.  She has no complaints.  She is asking about ways to verify infection is cured.   ? ? ?Review of Systems  ?Constitutional:  Negative for chills and fever.  ?Gastrointestinal:  Negative for diarrhea and nausea.  ?Skin:  Negative for rash.  ? ?   ?Objective:  ? Physical Exam ?Eyes:  ?   General: No scleral icterus. ?Pulmonary:  ?   Effort: Pulmonary effort is normal.  ?Skin: ?   Findings: No rash.  ?Neurological:  ?   Mental Status: She is alert.  ? ?SH: no tobacco ? ? ? ?   ?Assessment & Plan:  ? ? ?

## 2021-12-25 NOTE — Assessment & Plan Note (Signed)
No issues with the picc line.  ?Plan to have home health remove this after the last dose.  ?

## 2021-12-25 NOTE — Assessment & Plan Note (Signed)
CK, creat, LFTs wnl.   ?

## 2021-12-25 NOTE — Telephone Encounter (Signed)
Per MD called [facility] to confirm IV antibiotics end date and to ensure pull picc orders. Spoke with Cammy Copa who was able to confirm orders.  ?Juanita Laster, RMA  ?

## 2021-12-26 DIAGNOSIS — M009 Pyogenic arthritis, unspecified: Secondary | ICD-10-CM | POA: Diagnosis not present

## 2021-12-27 DIAGNOSIS — M009 Pyogenic arthritis, unspecified: Secondary | ICD-10-CM | POA: Diagnosis not present

## 2021-12-27 DIAGNOSIS — M00849 Arthritis due to other bacteria, unspecified hand: Secondary | ICD-10-CM | POA: Diagnosis not present

## 2021-12-27 DIAGNOSIS — A327 Listerial sepsis: Secondary | ICD-10-CM | POA: Diagnosis not present

## 2021-12-28 DIAGNOSIS — M009 Pyogenic arthritis, unspecified: Secondary | ICD-10-CM | POA: Diagnosis not present

## 2021-12-29 DIAGNOSIS — M009 Pyogenic arthritis, unspecified: Secondary | ICD-10-CM | POA: Diagnosis not present

## 2021-12-30 DIAGNOSIS — M009 Pyogenic arthritis, unspecified: Secondary | ICD-10-CM | POA: Diagnosis not present

## 2021-12-31 DIAGNOSIS — M009 Pyogenic arthritis, unspecified: Secondary | ICD-10-CM | POA: Diagnosis not present

## 2022-01-01 DIAGNOSIS — M009 Pyogenic arthritis, unspecified: Secondary | ICD-10-CM | POA: Diagnosis not present

## 2022-01-01 DIAGNOSIS — A327 Listerial sepsis: Secondary | ICD-10-CM | POA: Diagnosis not present

## 2022-01-01 DIAGNOSIS — M00849 Arthritis due to other bacteria, unspecified hand: Secondary | ICD-10-CM | POA: Diagnosis not present

## 2022-01-02 DIAGNOSIS — M009 Pyogenic arthritis, unspecified: Secondary | ICD-10-CM | POA: Diagnosis not present

## 2022-01-03 DIAGNOSIS — M009 Pyogenic arthritis, unspecified: Secondary | ICD-10-CM | POA: Diagnosis not present

## 2022-01-04 DIAGNOSIS — A327 Listerial sepsis: Secondary | ICD-10-CM | POA: Diagnosis not present

## 2022-01-04 DIAGNOSIS — M009 Pyogenic arthritis, unspecified: Secondary | ICD-10-CM | POA: Diagnosis not present

## 2022-01-17 ENCOUNTER — Other Ambulatory Visit: Payer: Self-pay | Admitting: Radiology

## 2022-01-17 DIAGNOSIS — Z30011 Encounter for initial prescription of contraceptive pills: Secondary | ICD-10-CM

## 2022-01-17 DIAGNOSIS — F418 Other specified anxiety disorders: Secondary | ICD-10-CM

## 2022-01-17 MED ORDER — ALPRAZOLAM 0.25 MG PO TABS: 0.2500 mg | ORAL_TABLET | Freq: Every evening | ORAL | 1 refills | Status: DC | PRN

## 2022-01-17 MED ORDER — NORETHIN-ETH ESTRAD-FE BIPHAS 1 MG-10 MCG / 10 MCG PO TABS: 1.0000 | ORAL_TABLET | Freq: Every day | ORAL | 1 refills | Status: DC

## 2022-01-17 NOTE — Progress Notes (Signed)
Pt calls requesting to restart OCPs after IUD removal. Did not fill phexxi rx. Also requesting refill on xanax, increased anxiety due to health concerns. Had to have a PICC line placed for IV antibiotics for chronic staph, awaiting hip replacement, increasing stress in marriage. Rx's both sent .

## 2022-01-24 LAB — ACID FAST CULTURE WITH REFLEXED SENSITIVITIES (MYCOBACTERIA): Acid Fast Culture: NEGATIVE

## 2022-02-25 DIAGNOSIS — Z4789 Encounter for other orthopedic aftercare: Secondary | ICD-10-CM | POA: Diagnosis not present

## 2022-02-25 DIAGNOSIS — Z471 Aftercare following joint replacement surgery: Secondary | ICD-10-CM | POA: Diagnosis not present

## 2022-03-26 ENCOUNTER — Other Ambulatory Visit: Payer: Self-pay | Admitting: Radiology

## 2022-03-26 DIAGNOSIS — F418 Other specified anxiety disorders: Secondary | ICD-10-CM

## 2022-04-23 ENCOUNTER — Other Ambulatory Visit: Payer: Self-pay | Admitting: Orthopedic Surgery

## 2022-04-23 DIAGNOSIS — M25551 Pain in right hip: Secondary | ICD-10-CM

## 2022-04-30 ENCOUNTER — Ambulatory Visit
Admission: RE | Admit: 2022-04-30 | Discharge: 2022-04-30 | Disposition: A | Discharge status: Home / Self Care | Payer: BC Managed Care – PPO | Source: Ambulatory Visit | Attending: Orthopedic Surgery | Admitting: Orthopedic Surgery

## 2022-04-30 DIAGNOSIS — M00051 Staphylococcal arthritis, right hip: Secondary | ICD-10-CM

## 2022-04-30 DIAGNOSIS — M25551 Pain in right hip: Secondary | ICD-10-CM | POA: Diagnosis not present

## 2022-05-02 LAB — TIQ-NTM

## 2022-05-05 LAB — CELL COUNT + DIFF, W/O CRYST-SYNVL FLD
Basophils, %: 0 %
Eosinophils-Synovial: 0 % (ref 0–2)
Lymphocytes-Synovial Fld: 75 % — ABNORMAL HIGH (ref 0–74)
Monocyte/Macrophage: 23 % (ref 0–69)
Neutrophil, Synovial: 3 % (ref 0–24)
Synoviocytes, %: 0 % (ref 0–15)
WBC, Synovial: 5104 cells/uL — ABNORMAL HIGH (ref <150)

## 2022-05-05 LAB — ANAEROBIC AND AEROBIC CULTURE
AER RESULT:: NO GROWTH
MICRO NUMBER:: 13905309
MICRO NUMBER:: 13905310
SPECIMEN QUALITY:: ADEQUATE
SPECIMEN QUALITY:: ADEQUATE

## 2022-05-24 NOTE — Patient Instructions (Signed)
DUE TO COVID-19 ONLY TWO VISITORS  (aged 35 and older)  ARE ALLOWED TO COME WITH YOU AND STAY IN THE WAITING ROOM ONLY DURING PRE OP AND PROCEDURE.   **NO VISITORS ARE ALLOWED IN THE SHORT STAY AREA OR RECOVERY ROOM!!**  IF YOU WILL BE ADMITTED INTO THE HOSPITAL YOU ARE ALLOWED ONLY FOUR SUPPORT PEOPLE DURING VISITATION HOURS ONLY (7 AM -8PM)   The support person(s) must pass our screening, gel in and out, and wear a mask at all times, including in the patient's room. Patients must also wear a mask when staff or their support person are in the room. Visitors GUEST BADGE MUST BE WORN VISIBLY  One adult visitor may remain with you overnight and MUST be in the room by 8 P.M.     Your procedure is scheduled on: 06/05/22   Report to [facility] Main Entrance    Report to admitting at : 12:50 PM   Call this number if you have problems the morning of surgery [phone]   Do not eat food :After Midnight.   After Midnight you may have the following liquids until : 12:00 PM DAY OF SURGERY  Water Black Coffee (sugar ok, NO MILK/CREAM OR CREAMERS)  Tea (sugar ok, NO MILK/CREAM OR CREAMERS) regular and decaf                             Plain Jell-O (NO RED)                                           Fruit ices (not with fruit pulp, NO RED)                                     Popsicles (NO RED)                                                                  Juice: apple, WHITE grape, WHITE cranberry Sports drinks like Gatorade (NO RED)              Drink Ensure drink AT: 12:00 PM the day of surgery.     The day of surgery:  Drink ONE (1) Pre-Surgery Clear Ensure or G2 at AM the morning of surgery. Drink in one sitting. Do not sip.  This drink was given to you during your hospital  pre-op appointment visit. Nothing else to drink after completing the  Pre-Surgery Clear Ensure or G2.          If you have questions, please contact your surgeon's office.   Oral Hygiene is also  important to reduce your risk of infection.                                    Remember - BRUSH YOUR TEETH THE MORNING OF SURGERY WITH YOUR REGULAR TOOTHPASTE   Do NOT smoke after Midnight   Take these medicines the morning of surgery with A SIP OF  WATER: sertraline.Tylenol as needed.                              You may not have any metal on your body including hair pins, jewelry, and body piercing             Do not wear make-up, lotions, powders, perfumes/cologne, or deodorant  Do not wear nail polish including gel and S&S, artificial/acrylic nails, or any other type of covering on natural nails including finger and toenails. If you have artificial nails, gel coating, etc. that needs to be removed by a nail salon please have this removed prior to surgery or surgery may need to be canceled/ delayed if the surgeon/ anesthesia feels like they are unable to be safely monitored.   Do not shave  48 hours prior to surgery.   Do not bring valuables to the hospital. [facility] IS NOT             RESPONSIBLE   FOR VALUABLES.   Contacts, dentures or bridgework may not be worn into surgery.   Bring small overnight bag day of surgery.   DO NOT BRING YOUR HOME MEDICATIONS TO THE HOSPITAL. PHARMACY WILL DISPENSE MEDICATIONS LISTED ON YOUR MEDICATION LIST TO YOU DURING YOUR ADMISSION IN THE HOSPITAL!    Patients discharged on the day of surgery will not be allowed to drive home.  Someone NEEDS to stay with you for the first 24 hours after anesthesia.   Special Instructions: Bring a copy of your healthcare power of attorney and living will documents         the day of surgery if you haven't scanned them before.              Please read over the following fact sheets you were given: IF YOU HAVE QUESTIONS ABOUT YOUR PRE-OP INSTRUCTIONS PLEASE CALL [phone]     [facility] - Preparing for Surgery Before surgery, you can play an important role.  Because skin is not sterile, your skin needs to be  as free of germs as possible.  You can reduce the number of germs on your skin by washing with CHG (chlorahexidine gluconate) soap before surgery.  CHG is an antiseptic cleaner which kills germs and bonds with the skin to continue killing germs even after washing. Please DO NOT use if you have an allergy to CHG or antibacterial soaps.  If your skin becomes reddened/irritated stop using the CHG and inform your nurse when you arrive at Short Stay. Do not shave (including legs and underarms) for at least 48 hours prior to the first CHG shower.  You may shave your face/neck. Please follow these instructions carefully:  1.  Shower with CHG Soap the night before surgery and the  morning of Surgery.  2.  If you choose to wash your hair, wash your hair first as usual with your  normal  shampoo.  3.  After you shampoo, rinse your hair and body thoroughly to remove the  shampoo.                           4.  Use CHG as you would any other liquid soap.  You can apply chg directly  to the skin and wash                       Gently with a  scrungie or clean washcloth.  5.  Apply the CHG Soap to your body ONLY FROM THE NECK DOWN.   Do not use on face/ open                           Wound or open sores. Avoid contact with eyes, ears mouth and genitals (private parts).                       Wash face,  Genitals (private parts) with your normal soap.             6.  Wash thoroughly, paying special attention to the area where your surgery  will be performed.  7.  Thoroughly rinse your body with warm water from the neck down.  8.  DO NOT shower/wash with your normal soap after using and rinsing off  the CHG Soap.                9.  Pat yourself dry with a clean towel.            10.  Wear clean pajamas.            11.  Place clean sheets on your bed the night of your first shower and do not  sleep with pets. Day of Surgery : Do not apply any lotions/deodorants the morning of surgery.  Please wear clean clothes to the  hospital/surgery center.  FAILURE TO FOLLOW THESE INSTRUCTIONS MAY RESULT IN THE CANCELLATION OF YOUR SURGERY PATIENT SIGNATURE_________________________________  NURSE SIGNATURE__________________________________  ________________________________________________________________________   Tiffany Leonard  An incentive spirometer is a tool that can help keep your lungs clear and active. This tool measures how well you are filling your lungs with each breath. Taking long deep breaths may help reverse or decrease the chance of developing breathing (pulmonary) problems (especially infection) following: A long period of time when you are unable to move or be active. BEFORE THE PROCEDURE  If the spirometer includes an indicator to show your best effort, your nurse or respiratory therapist will set it to a desired goal. If possible, sit up straight or lean slightly forward. Try not to slouch. Hold the incentive spirometer in an upright position. INSTRUCTIONS FOR USE  Sit on the edge of your bed if possible, or sit up as far as you can in bed or on a chair. Hold the incentive spirometer in an upright position. Breathe out normally. Place the mouthpiece in your mouth and seal your lips tightly around it. Breathe in slowly and as deeply as possible, raising the piston or the ball toward the top of the column. Hold your breath for 3-5 seconds or for as long as possible. Allow the piston or ball to fall to the bottom of the column. Remove the mouthpiece from your mouth and breathe out normally. Rest for a few seconds and repeat Steps 1 through 7 at least 10 times every 1-2 hours when you are awake. Take your time and take a few normal breaths between deep breaths. The spirometer may include an indicator to show your best effort. Use the indicator as a goal to work toward during each repetition. After each set of 10 deep breaths, practice coughing to be sure your lungs are clear. If you have an  incision (the cut made at the time of surgery), support your incision when coughing by placing a pillow or rolled up towels firmly against it.  Once you are able to get out of bed, walk around indoors and cough well. You may stop using the incentive spirometer when instructed by your caregiver.  RISKS AND COMPLICATIONS Take your time so you do not get dizzy or light-headed. If you are in pain, you may need to take or ask for pain medication before doing incentive spirometry. It is harder to take a deep breath if you are having pain. AFTER USE Rest and breathe slowly and easily. It can be helpful to keep track of a log of your progress. Your caregiver can provide you with a simple table to help with this. If you are using the spirometer at home, follow these instructions: [place] IF:  You are having difficultly using the spirometer. You have trouble using the spirometer as often as instructed. Your pain medication is not giving enough relief while using the spirometer. You develop fever of 100.5 F (38.1 C) or higher. SEEK IMMEDIATE MEDICAL CARE IF:  You cough up bloody sputum that had not been present before. You develop fever of 102 F (38.9 C) or greater. You develop worsening pain at or near the incision site. MAKE SURE YOU:  Understand these instructions. Will watch your condition. Will get help right away if you are not doing well or get worse. Document Released: 12/16/2006 Document Revised: 10/28/2011 Document Reviewed: 02/16/2007 [facility] Patient Information 2014 [place], [place].   ________________________________________________________________________

## 2022-05-28 ENCOUNTER — Encounter (HOSPITAL_COMMUNITY): Payer: Self-pay

## 2022-05-28 ENCOUNTER — Other Ambulatory Visit: Payer: Self-pay

## 2022-05-28 ENCOUNTER — Encounter (HOSPITAL_COMMUNITY)
Admission: RE | Admit: 2022-05-28 | Discharge: 2022-05-28 | Disposition: A | Discharge status: Home / Self Care | Payer: BC Managed Care – PPO | Source: Ambulatory Visit | Attending: Orthopedic Surgery | Admitting: Orthopedic Surgery

## 2022-05-28 VITALS — BP 121/80 | HR 69 | Temp 98.5°F | Ht 70.0 in | Wt 167.0 lb

## 2022-05-28 DIAGNOSIS — Z01812 Encounter for preprocedural laboratory examination: Secondary | ICD-10-CM | POA: Diagnosis not present

## 2022-05-28 DIAGNOSIS — Z01818 Encounter for other preprocedural examination: Secondary | ICD-10-CM

## 2022-05-28 LAB — CBC
HCT: 39.6 % (ref 36.0–46.0)
Hemoglobin: 12.7 g/dL (ref 12.0–15.0)
MCH: 29.1 pg (ref 26.0–34.0)
MCHC: 32.1 g/dL (ref 30.0–36.0)
MCV: 90.8 fL (ref 80.0–100.0)
Platelets: 225 10*3/uL (ref 150–400)
RBC: 4.36 MIL/uL (ref 3.87–5.11)
RDW: 14.5 % (ref 11.5–15.5)
WBC: 9.4 10*3/uL (ref 4.0–10.5)
nRBC: 0 % (ref 0.0–0.2)

## 2022-05-28 NOTE — H&P (Signed)
HIP ADMISSION H&P  Patient is admitted for right total hip arthroplasty reimplantation.  Subjective:  Chief Complaint: Right hip pain  HPI: Tiffany Leonard, 36 y.o. female, presents for a pre-operative visit in preparation for their right total hip arthroplasty reimplantation, which is scheduled on 06/05/2022 with Dr. Lequita Halt at [facility]. The patient has had symptoms in the right hip including pain which has impacted their quality of life and ability to do activities of daily living. The patient currently has a diagnosis of status post right hip resection arthroplasty with antibiotic spacer.  Patient Active Problem List   Diagnosis Date Noted   PICC (peripherally inserted central catheter) in place 12/25/2021   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/21/2021   Osteomyelitis of right femur (HCC) 02/14/2021   Medication monitoring encounter 09/23/2020   Septic arthritis of hip (HCC) 09/11/2020   Pregnancy 12/16/2019   Encounter for supervision of normal first pregnancy in third trimester 12/15/2019   Active labor 07/11/2015    Past Medical History:  Diagnosis Date   Anxiety    Arthritis     Past Surgical History:  Procedure Laterality Date   broken pelvis  2014   EXCISIONAL TOTAL HIP ARTHROPLASTY WITH ANTIBIOTIC SPACERS Right 01/01/2021   Procedure: Right hip resection arthroplasty with antibiotic spacer;  Surgeon: Ollen Gross, MD;  Location: [facility];  Service: Orthopedics;  Laterality: Right;    INCISION AND DRAINAGE HIP Right 09/11/2020   Procedure: Right hip arthrotomy, irrigation and debridement-anterior approach;  Surgeon: Ollen Gross, MD;  Location: [facility];  Service: Orthopedics;  Laterality: Right;    REIMPLANTATION OF TOTAL HIP Right 11/21/2021   Procedure: REIMPLANTATION OF TOTAL HIP;  Surgeon: Ollen Gross, MD;  Location: [facility];  Service: Orthopedics;  Laterality: Right;   SHOULDER SURGERY  2014    Prior to Admission medications    Medication Sig Start Date End Date Taking? Authorizing Provider  acetaminophen (TYLENOL) 500 MG tablet Take 1,000 mg by mouth every 6 (six) hours as needed for mild pain or headache.   Yes [provider]  ALPRAZolam (XANAX) 0.25 MG tablet TAKE 1 TABLET BY MOUTH AT BEDTIME AS NEEDED FOR ANXIETY. Patient taking differently: Take 0.25 mg by mouth at bedtime. 03/26/22  Yes Chrzanowski, Jami B, NP  sertraline (ZOLOFT) 100 MG tablet Take 1 tablet (100 mg total) by mouth daily. 03/26/22  Yes Chrzanowski, Jami B, NP  HYDROcodone-acetaminophen (NORCO/VICODIN) 5-325 MG tablet Take 1-2 tablets by mouth every 6 (six) hours as needed for moderate pain or severe pain. Patient not taking: Reported on 05/23/2022 11/23/21   Edmisten, Danford Bad L, PA  Lactic Ac-Citric Ac-Pot Bitart (PHEXXI) 1.8-1-0.4 % GEL Place 5 g vaginally as directed. Patient not taking: Reported on 05/23/2022 11/13/21   Chrzanowski, Clearnce Hasten B, NP  methocarbamol (ROBAXIN) 500 MG tablet Take 1 tablet (500 mg total) by mouth every 6 (six) hours as needed for muscle spasms. Patient not taking: Reported on 05/23/2022 11/23/21   Edmisten, Danford Bad L, PA  Norethindrone-Ethinyl Estradiol-Fe Biphas (LO LOESTRIN FE) 1 MG-10 MCG / 10 MCG tablet Take 1 tablet by mouth daily. Patient not taking: Reported on 05/23/2022 01/17/22   Arlie Solomons B, NP    Allergies  Allergen Reactions   Banana Itching    Pt reports itching in mouth and throat    Social History   Socioeconomic History   Marital status: Married    Spouse name: Not on file   Number of children: Not on file  Years of education: Not on file   Highest education level: Not on file  Occupational History   Not on file  Tobacco Use   Smoking status: Never   Smokeless tobacco: Never  Vaping Use   Vaping Use: Never used  Substance and Sexual Activity   Alcohol use: Not Currently    Comment: occas.   Drug use: Never   Sexual activity: Yes    Birth control/protection: I.U.D.  Other Topics  Concern   Not on file  Social History Narrative   Not on file   Social Determinants of Health   Financial Resource Strain: Not on file  Food Insecurity: Not on file  Transportation Needs: Not on file  Physical Activity: Not on file  Stress: Not on file  Social Connections: Not on file  Intimate Partner Violence: Not on file    Tobacco Use: Low Risk  (05/28/2022)   Patient History    Smoking Tobacco Use: Never    Smokeless Tobacco Use: Never    Passive Exposure: Not on file   Social History   Substance and Sexual Activity  Alcohol Use Not Currently   Comment: occas.    No family history on file.  Review of Systems  Constitutional:  Negative for chills and fever.  HENT: Negative.    Eyes: Negative.   Respiratory:  Negative for cough and shortness of breath.   Cardiovascular:  Negative for chest pain and palpitations.  Gastrointestinal:  Negative for abdominal pain, constipation, diarrhea, nausea and vomiting.  Genitourinary:  Negative for dysuria, frequency and urgency.  Musculoskeletal:  Positive for joint pain.  Skin:  Negative for rash.    Objective:  Physical Exam: Well nourished and well developed.  General: Alert and oriented x3, cooperative and pleasant, no acute distress.  Head: normocephalic, atraumatic, neck supple.  Eyes: EOMI. Abdomen: non-tender to palpation and soft, normoactive bowel sounds. Musculoskeletal: Minimally antalgic gait pattern, ambulating without assistive devices   Right hip exam:  No pain with passive range of motion of the right hip  AROM 120 flexion, 30 IR, 30 ER, 40 abduction  Calves soft and nontender. Motor function intact in LE. Strength 5/5 LE bilaterally. Neuro: Distal pulses 2+. Sensation to light touch intact in LE.  Vital signs in last 24 hours: Temp:  [98.5 F (36.9 C)] 98.5 F (36.9 C) (10/10 1128) Pulse Rate:  [69] 69 (10/10 1128) BP: (121)/(80) 121/80 (10/10 1128) SpO2:  [98 %] 98 % (10/10 1128) Weight:   [75.8 kg] 75.8 kg (10/10 1128)  Imaging Review AP pelvis and lateral XR of the right hip from 02/2022 shows the spacer in good position with no abnormalities.  Assessment/Plan:  Status post right hip resection arthroplasty with antibiotic spacer   Patient presents today for right hip reimplantation. The risks and benefits of the procedure were discussed in detail. The patient acknowledged the explanation, agreed to proceed with the plan and consent was signed. Patient is being admitted for inpatient treatment for surgery, pain control, PT, OT, prophylactic antibiotics, VTE prophylaxis, progressive ambulation and ADLs and discharge planning. The patient is planning to be discharge home.  Therapy Plans: HEP Disposition: Home with Husband Planned DVT Prophylaxis: Aspirin 325 mg BID DME Needed: None PCP: Marda Stalker, PA (clearance received) TXA: IV Allergies: banana (itching) Anesthesia Concerns: None BMI: 25.1 Last HgbA1c: not diabetic  Pharmacy: [facility] [place], [place])  Other: -Hx of scoliosis - concerned about leveling of hips (previous discussion about lengthening the right side)  - Patient was  instructed on what medications to stop prior to surgery. - Follow-up visit in 2 weeks with Dr. Lequita Halt - Begin physical therapy following surgery - Pre-operative lab work as pre-surgical testing - Prescriptions will be provided in hospital at time of discharge  R. Arcola Jansky, PA-C Orthopedic Surgery [facility] [place]

## 2022-05-28 NOTE — Progress Notes (Signed)
For Short Stay: [place] appointment date: Date of COVID positive in last 67 days:  Bowel Prep reminder:   For Anesthesia: PCP - Marda Stalker: PA-C Cardiologist -   Chest x-ray -  EKG -  Stress Test -  ECHO -  Cardiac Cath -  Pacemaker/ICD device last checked: Pacemaker orders received: Device Rep notified:  Spinal Cord Stimulator:  Sleep Study -  CPAP -   Fasting Blood Sugar -  Checks Blood Sugar _____ times a day Date and result of last Hgb A1c-  Blood Thinner Instructions: Aspirin Instructions: Last Dose:  Activity level: Can go up a flight of stairs and activities of daily living without stopping and without chest pain and/or shortness of breath   Able to exercise without chest pain and/or shortness of breath   Unable to go up a flight of stairs without chest pain and/or shortness of breath     Anesthesia review:   Patient denies shortness of breath, fever, cough and chest pain at PAT appointment   Patient verbalized understanding of instructions that were given to them at the PAT appointment. Patient was also instructed that they will need to review over the PAT instructions again at home before surgery.

## 2022-05-28 NOTE — Progress Notes (Signed)
Labs results: hemoglobin: 9.3. HCT: 28.6

## 2022-06-05 ENCOUNTER — Inpatient Hospital Stay (HOSPITAL_COMMUNITY)
Admission: RE | Admit: 2022-06-05 | Discharge: 2022-06-06 | DRG: 468 | Disposition: A | Discharge status: Home / Self Care | Payer: BC Managed Care – PPO | Attending: Orthopedic Surgery | Admitting: Orthopedic Surgery

## 2022-06-05 ENCOUNTER — Inpatient Hospital Stay (HOSPITAL_COMMUNITY): Payer: BC Managed Care – PPO

## 2022-06-05 ENCOUNTER — Ambulatory Visit (HOSPITAL_COMMUNITY): Payer: BC Managed Care – PPO | Admitting: Certified Registered Nurse Anesthetist

## 2022-06-05 ENCOUNTER — Encounter (HOSPITAL_COMMUNITY)
Admission: RE | Disposition: A | Discharge status: Home / Self Care | Payer: Self-pay | Source: Home / Self Care | Attending: Orthopedic Surgery

## 2022-06-05 ENCOUNTER — Other Ambulatory Visit: Payer: Self-pay

## 2022-06-05 ENCOUNTER — Encounter (HOSPITAL_COMMUNITY): Payer: Self-pay | Admitting: Orthopedic Surgery

## 2022-06-05 DIAGNOSIS — Z96641 Presence of right artificial hip joint: Secondary | ICD-10-CM | POA: Diagnosis not present

## 2022-06-05 DIAGNOSIS — Z9889 Other specified postprocedural states: Principal | ICD-10-CM | POA: Diagnosis present

## 2022-06-05 DIAGNOSIS — Z79899 Other long term (current) drug therapy: Secondary | ICD-10-CM

## 2022-06-05 DIAGNOSIS — Z8619 Personal history of other infectious and parasitic diseases: Secondary | ICD-10-CM

## 2022-06-05 DIAGNOSIS — Z471 Aftercare following joint replacement surgery: Secondary | ICD-10-CM | POA: Diagnosis not present

## 2022-06-05 DIAGNOSIS — Z4732 Aftercare following explantation of hip joint prosthesis: Secondary | ICD-10-CM | POA: Diagnosis not present

## 2022-06-05 DIAGNOSIS — F419 Anxiety disorder, unspecified: Secondary | ICD-10-CM | POA: Diagnosis not present

## 2022-06-05 DIAGNOSIS — R531 Weakness: Secondary | ICD-10-CM | POA: Diagnosis not present

## 2022-06-05 DIAGNOSIS — Z01818 Encounter for other preprocedural examination: Secondary | ICD-10-CM

## 2022-06-05 DIAGNOSIS — M00251 Other streptococcal arthritis, right hip: Secondary | ICD-10-CM | POA: Diagnosis not present

## 2022-06-05 DIAGNOSIS — T8451XA Infection and inflammatory reaction due to internal right hip prosthesis, initial encounter: Secondary | ICD-10-CM | POA: Diagnosis not present

## 2022-06-05 HISTORY — DX: Personal history of other infectious and parasitic diseases: Z86.19

## 2022-06-05 HISTORY — PX: REIMPLANTATION OF TOTAL HIP: SHX6051

## 2022-06-05 LAB — POCT PREGNANCY, URINE: Preg Test, Ur: NEGATIVE

## 2022-06-05 LAB — SURGICAL PCR SCREEN
MRSA, PCR: NEGATIVE
Staphylococcus aureus: NEGATIVE

## 2022-06-05 SURGERY — REIMPLANTATION OF TOTAL HIP
Anesthesia: General | Site: Hip | Laterality: Right

## 2022-06-05 MED ORDER — FENTANYL CITRATE (PF) 100 MCG/2ML IJ SOLN
INTRAMUSCULAR | Status: DC | PRN
  Administered 2022-06-05 (×3): 50 ug via INTRAVENOUS
  Administered 2022-06-05 (×2): 25 ug via INTRAVENOUS

## 2022-06-05 MED ORDER — MIDAZOLAM HCL 2 MG/2ML IJ SOLN
INTRAMUSCULAR | Status: AC
  Filled 2022-06-05: qty 2

## 2022-06-05 MED ORDER — CHLORHEXIDINE GLUCONATE 0.12 % MT SOLN
15.0000 mL | Freq: Once | OROMUCOSAL | Status: AC
  Administered 2022-06-05: 15 mL via OROMUCOSAL

## 2022-06-05 MED ORDER — TRAMADOL HCL 50 MG PO TABS
50.0000 mg | ORAL_TABLET | Freq: Four times a day (QID) | ORAL | Status: DC | PRN
  Administered 2022-06-05 – 2022-06-06 (×3): 100 mg via ORAL
  Filled 2022-06-05 (×3): qty 2

## 2022-06-05 MED ORDER — MIDAZOLAM HCL 5 MG/5ML IJ SOLN
INTRAMUSCULAR | Status: DC | PRN
  Administered 2022-06-05: 2 mg via INTRAVENOUS

## 2022-06-05 MED ORDER — KETAMINE HCL 10 MG/ML IJ SOLN
INTRAMUSCULAR | Status: DC | PRN
  Administered 2022-06-05: 30 mg via INTRAVENOUS
  Administered 2022-06-05 (×2): 10 mg via INTRAVENOUS

## 2022-06-05 MED ORDER — ONDANSETRON HCL 4 MG PO TABS
4.0000 mg | ORAL_TABLET | Freq: Four times a day (QID) | ORAL | Status: DC | PRN
  Administered 2022-06-05: 4 mg via ORAL
  Filled 2022-06-05: qty 1

## 2022-06-05 MED ORDER — HYDROMORPHONE HCL 1 MG/ML IJ SOLN
INTRAMUSCULAR | Status: AC
  Filled 2022-06-05: qty 2

## 2022-06-05 MED ORDER — DOCUSATE SODIUM 100 MG PO CAPS
100.0000 mg | ORAL_CAPSULE | Freq: Two times a day (BID) | ORAL | Status: DC
  Administered 2022-06-05 – 2022-06-06 (×2): 100 mg via ORAL
  Filled 2022-06-05 (×2): qty 1

## 2022-06-05 MED ORDER — SERTRALINE HCL 100 MG PO TABS
100.0000 mg | ORAL_TABLET | Freq: Every day | ORAL | Status: DC
  Administered 2022-06-06: 100 mg via ORAL
  Filled 2022-06-05: qty 1

## 2022-06-05 MED ORDER — CEFAZOLIN SODIUM-DEXTROSE 2-4 GM/100ML-% IV SOLN
2.0000 g | Freq: Four times a day (QID) | INTRAVENOUS | Status: AC
  Administered 2022-06-05 – 2022-06-06 (×2): 2 g via INTRAVENOUS
  Filled 2022-06-05 (×2): qty 100

## 2022-06-05 MED ORDER — ORAL CARE MOUTH RINSE: 15.0000 mL | Freq: Once | OROMUCOSAL | Status: AC

## 2022-06-05 MED ORDER — ACETAMINOPHEN 325 MG PO TABS: 325.0000 mg | ORAL_TABLET | Freq: Four times a day (QID) | ORAL | Status: DC | PRN

## 2022-06-05 MED ORDER — PROPOFOL 10 MG/ML IV BOLUS
INTRAVENOUS | Status: AC
  Filled 2022-06-05: qty 20

## 2022-06-05 MED ORDER — HYDROMORPHONE HCL 1 MG/ML IJ SOLN
0.2500 mg | INTRAMUSCULAR | Status: DC | PRN
  Administered 2022-06-05 (×2): 0.5 mg via INTRAVENOUS

## 2022-06-05 MED ORDER — MENTHOL 3 MG MT LOZG: 1.0000 | LOZENGE | OROMUCOSAL | Status: DC | PRN

## 2022-06-05 MED ORDER — ONDANSETRON HCL 4 MG/2ML IJ SOLN
INTRAMUSCULAR | Status: DC | PRN
  Administered 2022-06-05: 4 mg via INTRAVENOUS

## 2022-06-05 MED ORDER — LACTATED RINGERS IV SOLN: INTRAVENOUS | Status: DC

## 2022-06-05 MED ORDER — SODIUM CHLORIDE 0.9 % IV SOLN: INTRAVENOUS | Status: DC

## 2022-06-05 MED ORDER — HYDROCODONE-ACETAMINOPHEN 5-325 MG PO TABS
1.0000 | ORAL_TABLET | ORAL | Status: DC | PRN
  Administered 2022-06-05: 1 via ORAL
  Filled 2022-06-05: qty 1

## 2022-06-05 MED ORDER — FENTANYL CITRATE (PF) 100 MCG/2ML IJ SOLN
INTRAMUSCULAR | Status: AC
  Filled 2022-06-05: qty 2

## 2022-06-05 MED ORDER — ASPIRIN 325 MG PO TBEC
325.0000 mg | DELAYED_RELEASE_TABLET | Freq: Two times a day (BID) | ORAL | Status: DC
  Administered 2022-06-06: 325 mg via ORAL
  Filled 2022-06-05: qty 1

## 2022-06-05 MED ORDER — DEXMEDETOMIDINE HCL IN NACL 80 MCG/20ML IV SOLN
INTRAVENOUS | Status: DC | PRN
  Administered 2022-06-05: 20 ug via BUCCAL

## 2022-06-05 MED ORDER — TRANEXAMIC ACID-NACL 1000-0.7 MG/100ML-% IV SOLN
1000.0000 mg | INTRAVENOUS | Status: AC
  Administered 2022-06-05: 1000 mg via INTRAVENOUS
  Filled 2022-06-05: qty 100

## 2022-06-05 MED ORDER — DEXAMETHASONE SODIUM PHOSPHATE 10 MG/ML IJ SOLN
8.0000 mg | Freq: Once | INTRAMUSCULAR | Status: AC
  Administered 2022-06-05: 8 mg via INTRAVENOUS

## 2022-06-05 MED ORDER — ACETAMINOPHEN 10 MG/ML IV SOLN: 1000.0000 mg | Freq: Once | INTRAVENOUS | Status: DC | PRN

## 2022-06-05 MED ORDER — METHOCARBAMOL 500 MG IVPB - SIMPLE MED
INTRAVENOUS | Status: AC
  Filled 2022-06-05: qty 55

## 2022-06-05 MED ORDER — CEFAZOLIN SODIUM-DEXTROSE 2-4 GM/100ML-% IV SOLN
2.0000 g | INTRAVENOUS | Status: AC
  Administered 2022-06-05: 2 g via INTRAVENOUS
  Filled 2022-06-05: qty 100

## 2022-06-05 MED ORDER — FENTANYL CITRATE PF 50 MCG/ML IJ SOSY
25.0000 ug | PREFILLED_SYRINGE | INTRAMUSCULAR | Status: DC | PRN
  Administered 2022-06-05 (×3): 50 ug via INTRAVENOUS

## 2022-06-05 MED ORDER — METOCLOPRAMIDE HCL 5 MG/ML IJ SOLN: 5.0000 mg | Freq: Three times a day (TID) | INTRAMUSCULAR | Status: DC | PRN

## 2022-06-05 MED ORDER — METHOCARBAMOL 500 MG IVPB - SIMPLE MED
500.0000 mg | Freq: Four times a day (QID) | INTRAVENOUS | Status: DC | PRN
  Administered 2022-06-05: 500 mg via INTRAVENOUS

## 2022-06-05 MED ORDER — SUGAMMADEX SODIUM 200 MG/2ML IV SOLN
INTRAVENOUS | Status: DC | PRN
  Administered 2022-06-05: 200 mg via INTRAVENOUS

## 2022-06-05 MED ORDER — EPHEDRINE 5 MG/ML INJ
INTRAVENOUS | Status: AC
  Filled 2022-06-05: qty 5

## 2022-06-05 MED ORDER — METOCLOPRAMIDE HCL 5 MG PO TABS: 5.0000 mg | ORAL_TABLET | Freq: Three times a day (TID) | ORAL | Status: DC | PRN

## 2022-06-05 MED ORDER — BUPIVACAINE HCL (PF) 0.25 % IJ SOLN
INTRAMUSCULAR | Status: DC | PRN
  Administered 2022-06-05: 30 mL

## 2022-06-05 MED ORDER — PROPOFOL 10 MG/ML IV BOLUS
INTRAVENOUS | Status: DC | PRN
  Administered 2022-06-05: 200 mg via INTRAVENOUS

## 2022-06-05 MED ORDER — LIDOCAINE HCL (PF) 2 % IJ SOLN
INTRAMUSCULAR | Status: AC
  Filled 2022-06-05: qty 5

## 2022-06-05 MED ORDER — ONDANSETRON HCL 4 MG/2ML IJ SOLN: 4.0000 mg | Freq: Four times a day (QID) | INTRAMUSCULAR | Status: DC | PRN

## 2022-06-05 MED ORDER — BISACODYL 10 MG RE SUPP: 10.0000 mg | Freq: Every day | RECTAL | Status: DC | PRN

## 2022-06-05 MED ORDER — ROCURONIUM BROMIDE 10 MG/ML (PF) SYRINGE
PREFILLED_SYRINGE | INTRAVENOUS | Status: DC | PRN
  Administered 2022-06-05: 20 mg via INTRAVENOUS
  Administered 2022-06-05: 60 mg via INTRAVENOUS
  Administered 2022-06-05 (×2): 20 mg via INTRAVENOUS

## 2022-06-05 MED ORDER — BUPIVACAINE HCL 0.25 % IJ SOLN
INTRAMUSCULAR | Status: AC
  Filled 2022-06-05: qty 1

## 2022-06-05 MED ORDER — ALPRAZOLAM 0.25 MG PO TABS
0.2500 mg | ORAL_TABLET | Freq: Every evening | ORAL | Status: DC | PRN
  Administered 2022-06-05: 0.25 mg via ORAL
  Filled 2022-06-05: qty 1

## 2022-06-05 MED ORDER — LIDOCAINE 2% (20 MG/ML) 5 ML SYRINGE
INTRAMUSCULAR | Status: DC | PRN
  Administered 2022-06-05: 100 mg via INTRAVENOUS

## 2022-06-05 MED ORDER — PROPOFOL 500 MG/50ML IV EMUL
INTRAVENOUS | Status: DC | PRN
  Administered 2022-06-05: 50 ug/kg/min via INTRAVENOUS

## 2022-06-05 MED ORDER — KETAMINE HCL 50 MG/5ML IJ SOSY
PREFILLED_SYRINGE | INTRAMUSCULAR | Status: AC
  Filled 2022-06-05: qty 5

## 2022-06-05 MED ORDER — DEXAMETHASONE SODIUM PHOSPHATE 10 MG/ML IJ SOLN
10.0000 mg | Freq: Once | INTRAMUSCULAR | Status: AC
  Administered 2022-06-06: 10 mg via INTRAVENOUS
  Filled 2022-06-05: qty 1

## 2022-06-05 MED ORDER — MORPHINE SULFATE (PF) 2 MG/ML IV SOLN
0.5000 mg | INTRAVENOUS | Status: DC | PRN
  Administered 2022-06-05 – 2022-06-06 (×2): 1 mg via INTRAVENOUS
  Filled 2022-06-05 (×2): qty 1

## 2022-06-05 MED ORDER — FENTANYL CITRATE PF 50 MCG/ML IJ SOSY
PREFILLED_SYRINGE | INTRAMUSCULAR | Status: AC
  Filled 2022-06-05: qty 3

## 2022-06-05 MED ORDER — MAGNESIUM CITRATE PO SOLN: 1.0000 | Freq: Once | ORAL | Status: DC | PRN

## 2022-06-05 MED ORDER — HYDROCODONE-ACETAMINOPHEN 7.5-325 MG PO TABS
1.0000 | ORAL_TABLET | ORAL | Status: DC | PRN
  Administered 2022-06-06: 2 via ORAL
  Filled 2022-06-05: qty 2

## 2022-06-05 MED ORDER — ACETAMINOPHEN 10 MG/ML IV SOLN
1000.0000 mg | Freq: Once | INTRAVENOUS | Status: AC
  Administered 2022-06-05: 1000 mg via INTRAVENOUS
  Filled 2022-06-05: qty 100

## 2022-06-05 MED ORDER — 0.9 % SODIUM CHLORIDE (POUR BTL) OPTIME
TOPICAL | Status: DC | PRN
  Administered 2022-06-05: 1000 mL

## 2022-06-05 MED ORDER — POVIDONE-IODINE 10 % EX SWAB
2.0000 | Freq: Once | CUTANEOUS | Status: AC
  Administered 2022-06-05: 2 via TOPICAL

## 2022-06-05 MED ORDER — ROCURONIUM BROMIDE 10 MG/ML (PF) SYRINGE
PREFILLED_SYRINGE | INTRAVENOUS | Status: AC
  Filled 2022-06-05: qty 10

## 2022-06-05 MED ORDER — PHENOL 1.4 % MT LIQD: 1.0000 | OROMUCOSAL | Status: DC | PRN

## 2022-06-05 MED ORDER — POLYETHYLENE GLYCOL 3350 17 G PO PACK: 17.0000 g | PACK | Freq: Every day | ORAL | Status: DC | PRN

## 2022-06-05 MED ORDER — METHOCARBAMOL 500 MG PO TABS
500.0000 mg | ORAL_TABLET | Freq: Four times a day (QID) | ORAL | Status: DC | PRN
  Administered 2022-06-05 – 2022-06-06 (×3): 500 mg via ORAL
  Filled 2022-06-05 (×3): qty 1

## 2022-06-05 SURGICAL SUPPLY — 60 items
BAG COUNTER SPONGE SURGICOUNT (BAG) IMPLANT
BALL HIP CERAMIC (Hips) IMPLANT
BIT DRILL 2.8X128 (BIT) ×1 IMPLANT
BLADE EXTENDED COATED 6.5IN (ELECTRODE) ×1 IMPLANT
BLADE SAW SGTL 73X25 THK (BLADE) ×1 IMPLANT
COVER SURGICAL LIGHT HANDLE (MISCELLANEOUS) ×1 IMPLANT
CUP ACETBLR 52 OD PINNACLE (Hips) IMPLANT
DRAPE INCISE IOBAN 66X45 STRL (DRAPES) ×1 IMPLANT
DRAPE ORTHO SPLIT 77X108 STRL (DRAPES) ×2
DRAPE POUCH INSTRU U-SHP 10X18 (DRAPES) ×1 IMPLANT
DRAPE SURG ORHT 6 SPLT 77X108 (DRAPES) ×2 IMPLANT
DRAPE U-SHAPE 47X51 STRL (DRAPES) ×1 IMPLANT
DRESSING MEPILEX FLEX 4X4 (GAUZE/BANDAGES/DRESSINGS) ×2 IMPLANT
DRSG AQUACEL AG ADV 3.5X10 (GAUZE/BANDAGES/DRESSINGS) IMPLANT
DRSG EMULSION OIL 3X16 NADH (GAUZE/BANDAGES/DRESSINGS) ×1 IMPLANT
DRSG MEPILEX FLEX 4X4 (GAUZE/BANDAGES/DRESSINGS) ×2
DRSG MEPILEX POST OP 4X8 (GAUZE/BANDAGES/DRESSINGS) ×1 IMPLANT
DURAPREP 26ML APPLICATOR (WOUND CARE) ×1 IMPLANT
ELECT REM PT RETURN 15FT ADLT (MISCELLANEOUS) ×1 IMPLANT
EVACUATOR 1/8 PVC DRAIN (DRAIN) ×1 IMPLANT
FACESHIELD WRAPAROUND (MASK) ×4 IMPLANT
FACESHIELD WRAPAROUND OR TEAM (MASK) ×4 IMPLANT
GAUZE SPONGE 4X4 12PLY STRL (GAUZE/BANDAGES/DRESSINGS) ×1 IMPLANT
GLOVE BIO SURGEON STRL SZ 6.5 (GLOVE) ×2 IMPLANT
GLOVE BIO SURGEON STRL SZ7.5 (GLOVE) ×1 IMPLANT
GLOVE BIO SURGEON STRL SZ8 (GLOVE) ×2 IMPLANT
GLOVE BIOGEL PI IND STRL 7.0 (GLOVE) ×2 IMPLANT
GLOVE BIOGEL PI IND STRL 8 (GLOVE) ×3 IMPLANT
GLOVE SURG SS PI 7.0 STRL IVOR (GLOVE) ×1 IMPLANT
GOWN STRL REUS W/ TWL LRG LVL3 (GOWN DISPOSABLE) ×3 IMPLANT
GOWN STRL REUS W/TWL LRG LVL3 (GOWN DISPOSABLE) ×3
HIP BALL CERAMIC (Hips) ×1 IMPLANT
IMMOBILIZER KNEE 20 (SOFTGOODS)
IMMOBILIZER KNEE 20 THIGH 36 (SOFTGOODS) IMPLANT
KIT TURNOVER KIT A (KITS) IMPLANT
LINER MARATHON NEUT +4X52X32 (Hips) IMPLANT
MANIFOLD NEPTUNE II (INSTRUMENTS) ×1 IMPLANT
NDL SAFETY ECLIP 18X1.5 (MISCELLANEOUS) IMPLANT
NS IRRIG 1000ML POUR BTL (IV SOLUTION) ×1 IMPLANT
PADDING CAST COTTON 6X4 STRL (CAST SUPPLIES) ×1 IMPLANT
PASSER SUT SWANSON 36MM LOOP (INSTRUMENTS) ×1 IMPLANT
PROTECTOR NERVE ULNAR (MISCELLANEOUS) ×1 IMPLANT
STAPLER VISISTAT 35W (STAPLE) ×1 IMPLANT
STEM FEMORAL SZ5 HIGH ACTIS (Stem) IMPLANT
STRIP CLOSURE SKIN 1/2X4 (GAUZE/BANDAGES/DRESSINGS) IMPLANT
SUCTION FRAZIER HANDLE 10FR (MISCELLANEOUS) ×1
SUCTION TUBE FRAZIER 10FR DISP (MISCELLANEOUS) ×1 IMPLANT
SUT ETHIBOND NAB CT1 #1 30IN (SUTURE) ×2 IMPLANT
SUT VIC AB 1 CT1 27 (SUTURE) ×3
SUT VIC AB 1 CT1 27XBRD ANTBC (SUTURE) ×3 IMPLANT
SUT VIC AB 2-0 CT1 27 (SUTURE) ×3
SUT VIC AB 2-0 CT1 TAPERPNT 27 (SUTURE) ×3 IMPLANT
SUT VIC AB 2-0 CT2 27 (SUTURE) IMPLANT
SUT VLOC 180 0 24IN GS25 (SUTURE) ×2 IMPLANT
SWAB COLLECTION DEVICE MRSA (MISCELLANEOUS) ×1 IMPLANT
SWAB CULTURE ESWAB REG 1ML (MISCELLANEOUS) IMPLANT
SYR 50ML LL SCALE MARK (SYRINGE) IMPLANT
TOWEL OR 17X26 10 PK STRL BLUE (TOWEL DISPOSABLE) ×2 IMPLANT
TRAY FOLEY MTR SLVR 16FR STAT (SET/KITS/TRAYS/PACK) ×1 IMPLANT
WATER STERILE IRR 1000ML POUR (IV SOLUTION) ×1 IMPLANT

## 2022-06-05 NOTE — Brief Op Note (Signed)
06/05/2022  2:47 PM  PATIENT:  Tiffany Leonard  36 y.o. female  PRE-OPERATIVE DIAGNOSIS:  s/p resection arthroplasty right hip  POST-OPERATIVE DIAGNOSIS:  s/p resection arthroplasty right hip  PROCEDURE:  Procedure(s): REIMPLANTATION OF TOTAL HIP-POSTERIOR (Right)  SURGEON:  Surgeon(s) and Role:    Gaynelle Arabian, MD - Primary  PHYSICIAN ASSISTANT:   ASSISTANTS: Theresa Duty, PA-C   ANESTHESIA:   spinal  EBL:  550 mL   BLOOD ADMINISTERED:none  DRAINS: none   LOCAL MEDICATIONS USED:  MARCAINE     COUNTS:  YES  TOURNIQUET:  * No tourniquets in log *  DICTATION: .Other Dictation: Dictation Number [id number]  PLAN OF CARE: Admit to inpatient   PATIENT DISPOSITION:  PACU - hemodynamically stable.

## 2022-06-05 NOTE — Anesthesia Preprocedure Evaluation (Addendum)
Anesthesia Evaluation  Patient identified by MRN, date of birth, ID band Patient awake    Reviewed: Allergy & Precautions, NPO status , Patient's Chart, lab work & pertinent test results  Airway Mallampati: II  TM Distance: >3 FB Neck ROM: Full    Dental no notable dental hx.    Pulmonary neg pulmonary ROS,    Pulmonary exam normal        Cardiovascular negative cardio ROS   Rhythm:Regular Rate:Normal     Neuro/Psych Anxiety negative neurological ROS     GI/Hepatic negative GI ROS, Neg liver ROS,   Endo/Other  negative endocrine ROS  Renal/GU negative Renal ROS  negative genitourinary   Musculoskeletal  (+) Arthritis , Osteoarthritis,    Abdominal Normal abdominal exam  (+)   Peds  Hematology negative hematology ROS (+)   Anesthesia Other Findings   Reproductive/Obstetrics                             Anesthesia Physical Anesthesia Plan  ASA: 2  Anesthesia Plan: General   Post-op Pain Management:    Induction: Intravenous  PONV Risk Score and Plan: 3 and Ondansetron, Dexamethasone, Midazolam and Treatment may vary due to age or medical condition  Airway Management Planned: Mask and Oral ETT  Additional Equipment: None  Intra-op Plan:   Post-operative Plan: Extubation in OR  Informed Consent: I have reviewed the patients History and Physical, chart, labs and discussed the procedure including the risks, benefits and alternatives for the proposed anesthesia with the patient or authorized representative who has indicated his/her understanding and acceptance.     Dental advisory given  Plan Discussed with:   Anesthesia Plan Comments: (DOS note: Patient with GA with prior hip surgeries by choice. Plan for GA today as patient not interested in spinal anesthetic.   Lab Results      Component                Value               Date                      WBC                       9.4                 05/28/2022                HGB                      12.7                05/28/2022                HCT                      39.6                05/28/2022                MCV                      90.8                05/28/2022                PLT  225                 05/28/2022           Lab Results      Component                Value               Date                      NA                       138                 11/23/2021                K                        3.7                 11/23/2021                CO2                      27                  11/23/2021                GLUCOSE                  121 (H)             11/23/2021                BUN                      10                  11/23/2021                CREATININE               0.47                11/23/2021                CALCIUM                  8.4 (L)             11/23/2021                GFRNONAA                 >60                 11/23/2021          )       Anesthesia Quick Evaluation

## 2022-06-05 NOTE — Interval H&P Note (Signed)
History and Physical Interval Note:  06/05/2022 11:13 AM  Tiffany Leonard  has presented today for surgery, with the diagnosis of s/p resection arthroplasty right hip.  The various methods of treatment have been discussed with the patient and family. After consideration of risks, benefits and other options for treatment, the patient has consented to  Procedure(s): REIMPLANTATION OF TOTAL HIP-POSTERIOR (Right) as a surgical intervention.  The patient's history has been reviewed, patient examined, no change in status, stable for surgery.  I have reviewed the patient's chart and labs.  Questions were answered to the patient's satisfaction.     Pilar Plate Tiffany Leonard

## 2022-06-05 NOTE — Transfer of Care (Signed)
Immediate Anesthesia Transfer of Care Note  Patient: Tiffany Leonard  Procedure(s) Performed: REIMPLANTATION OF TOTAL HIP-POSTERIOR (Right: Hip)  Patient Location: PACU  Anesthesia Type:General  Level of Consciousness: drowsy  Airway & Oxygen Therapy: Patient Spontanous Breathing  Post-op Assessment: Report given to RN and Post -op Vital signs reviewed and stable  Post vital signs: Reviewed and stable  Last Vitals:  Vitals Value Taken Time  BP 113/60 06/05/22 1531  Temp 36.7 C 06/05/22 1530  Pulse 108 06/05/22 1534  Resp 18 06/05/22 1534  SpO2 98 % 06/05/22 1534  Vitals shown include unvalidated device data.  Last Pain:  Vitals:   06/05/22 1133  TempSrc:   PainSc: 0-No pain      Patients Stated Pain Goal: 4 (41/63/84 [id number])  Complications: No notable events documented.

## 2022-06-05 NOTE — Anesthesia Procedure Notes (Signed)
Procedure Name: Intubation Date/Time: 06/05/2022 12:26 PM  Performed by: Milford Cage, CRNAPre-anesthesia Checklist: Patient identified, Emergency Drugs available, Suction available and Patient being monitored Patient Re-evaluated:Patient Re-evaluated prior to induction Oxygen Delivery Method: Circle system utilized Preoxygenation: Pre-oxygenation with 100% oxygen Induction Type: IV induction Ventilation: Mask ventilation without difficulty Laryngoscope Size: Miller and 2 Grade View: Grade I Tube type: Oral Tube size: 7.0 mm Number of attempts: 1 Airway Equipment and Method: Stylet Placement Confirmation: ETT inserted through vocal cords under direct vision, positive ETCO2 and breath sounds checked- equal and bilateral Secured at: 21 cm Tube secured with: Tape Dental Injury: Teeth and Oropharynx as per pre-operative assessment

## 2022-06-05 NOTE — Anesthesia Postprocedure Evaluation (Signed)
Anesthesia Post Note  Patient: Tiffany Leonard  Procedure(s) Performed: REIMPLANTATION OF TOTAL HIP-POSTERIOR (Right: Hip)     Patient location during evaluation: PACU Anesthesia Type: General Level of consciousness: awake and alert Pain management: pain level controlled Vital Signs Assessment: post-procedure vital signs reviewed and stable Respiratory status: spontaneous breathing, nonlabored ventilation, respiratory function stable and patient connected to nasal cannula oxygen Cardiovascular status: blood pressure returned to baseline and stable Postop Assessment: no apparent nausea or vomiting Anesthetic complications: no   No notable events documented.  Last Vitals:  Vitals:   06/05/22 1700 06/05/22 1715  BP: 125/72 126/82  Pulse: (!) 102 84  Resp: 16 11  Temp:    SpO2: 100% 100%    Last Pain:  Vitals:   06/05/22 1700  TempSrc:   PainSc: 0-No pain                 Belenda Cruise P Teola Felipe

## 2022-06-05 NOTE — Discharge Instructions (Signed)
 Dr. Frank Aluisio Total Joint Specialist [facility] [address] [place], [place] [address] [phone]  POSTERIOR TOTAL HIP REPLACEMENT POSTOPERATIVE DIRECTIONS  Hip Rehabilitation, Guidelines Following Surgery  The results of a hip operation are greatly improved after range of motion and muscle strengthening exercises. Follow all safety measures which are given to protect your hip. If any of these exercises cause increased pain or swelling in your joint, decrease the amount until you are comfortable again. Then slowly increase the exercises. Call your caregiver if you have problems or questions.   BLOOD CLOT PREVENTION Take a 325 mg Aspirin two times a day for three weeks following surgery. Then take an 81 mg Aspirin once a day for three weeks. Then discontinue Aspirin. You may resume your vitamins/supplements upon discharge from the hospital. Do not take any NSAIDs (Advil, Aleve, Ibuprofen, Meloxicam, etc.) until you have discontinued the 325 mg Aspirin.  PRECAUTIONS (6 WEEKS FOLLOWING SURGERY) Do not bend your hip past a 90 degree angle Do not cross your legs. Don't twist your hip inwards- keep knees and toes pointed upwards   HOME CARE INSTRUCTIONS  Remove items at home which could result in a fall. This includes throw rugs or furniture in walking pathways.  ICE to the affected hip every three hours for 30 minutes at a time and then as needed for pain and swelling.  Continue to use ice on the hip for pain and swelling from surgery. You may notice swelling that will progress down to the foot and ankle.  This is normal after surgery.  Elevate the leg when you are not up walking on it.   Continue to use the breathing machine which will help keep your temperature down.  It is common for your temperature to cycle up and down following surgery, especially at night when you are not up moving around and exerting yourself.  The breathing machine keeps your lungs expanded  and your temperature down.  DIET You may resume your previous home diet once your are discharged from the hospital.  DRESSING / WOUND CARE / SHOWERING Keep the surgical dressing until follow up.  The dressing is water proof, so you can shower without any extra covering.  IF THE DRESSING FALLS OFF or the wound gets wet inside, change the dressing with sterile gauze.  Please use good hand washing techniques before changing the dressing.  Do not use any lotions or creams on the incision until instructed by your surgeon.   You may start showering once you are discharged home but do not submerge the incision under water. Just pat the incision dry and apply a dry gauze dressing on daily. Change the surgical dressing daily and reapply a dry dressing each time.  ACTIVITY Walk with your walker as instructed. Use walker as long as suggested by your caregivers. Avoid periods of inactivity such as sitting longer than an hour when not asleep. This helps prevent blood clots.  You may resume a sexual relationship in one month or when given the OK by your doctor.  You may return to work once you are cleared by your doctor.  Do not drive a car for 6 weeks or until released by you surgeon.  Do not drive while taking narcotics.  WEIGHT BEARING Weight bearing as tolerated with assist device (walker, cane, etc) as directed, use it as long as suggested by your surgeon or therapist, typically at least 4-6 weeks.  POSTOPERATIVE CONSTIPATION PROTOCOL Constipation - defined medically as   fewer than three stools per week and severe constipation as less than one stool per week.  One of the most common issues patients have following surgery is constipation.  Even if you have a regular bowel pattern at home, your normal regimen is likely to be disrupted due to multiple reasons following surgery.  Combination of anesthesia, postoperative narcotics, change in appetite and fluid intake all can affect your bowels.  In order  to avoid complications following surgery, here are some recommendations in order to help you during your recovery period.  Colace (docusate) - Pick up an over-the-counter form of Colace or another stool softener and take twice a day as long as you are requiring postoperative pain medications.  Take with a full glass of water daily.  If you experience loose stools or diarrhea, hold the colace until you stool forms back up.  If your symptoms do not get better within 1 week or if they get worse, check with your doctor.  Dulcolax (bisacodyl) - Pick up over-the-counter and take as directed by the product packaging as needed to assist with the movement of your bowels.  Take with a full glass of water.  Use this product as needed if not relieved by Colace only.   MiraLax (polyethylene glycol) - Pick up over-the-counter to have on hand.  MiraLax is a solution that will increase the amount of water in your bowels to assist with bowel movements.  Take as directed and can mix with a glass of water, juice, soda, coffee, or tea.  Take if you go more than two days without a movement. Do not use MiraLax more than once per day. Call your doctor if you are still constipated or irregular after using this medication for 7 days in a row.  If you continue to have problems with postoperative constipation, please contact the office for further assistance and recommendations.  If you experience "the worst abdominal pain ever" or develop nausea or vomiting, please contact the office immediatly for further recommendations for treatment.  ITCHING  If you experience itching with your medications, try taking only a single pain pill, or even half a pain pill at a time.  You can also use Benadryl over the counter for itching or also to help with sleep.   TED HOSE STOCKINGS Wear the elastic stockings on both legs for three weeks following surgery during the day but you may remove then at night for sleeping.  MEDICATIONS See your  medication summary on the "After Visit Summary" that the nursing staff will review with you prior to discharge.  You may have some home medications which will be placed on hold until you complete the course of blood thinner medication.  It is important for you to complete the blood thinner medication as prescribed by your surgeon.  Continue your approved medications as instructed at time of discharge.  PRECAUTIONS If you experience chest pain or shortness of breath - call 911 immediately for transfer to the hospital emergency department.  If you develop a fever greater that 101 F, purulent drainage from wound, increased redness or drainage from wound, foul odor from the wound/dressing, or calf pain - CONTACT YOUR SURGEON.                                                   FOLLOW-UP APPOINTMENTS Make sure   you keep all of your appointments after your operation with your surgeon and caregivers. You should call the office at the above phone number and make an appointment for approximately two weeks after the date of your surgery or on the date instructed by your surgeon outlined in the "After Visit Summary".  RANGE OF MOTION AND STRENGTHENING EXERCISES  These exercises are designed to help you keep full movement of your hip joint. Follow your caregiver's or physical therapist's instructions. Perform all exercises about fifteen times, three times per day or as directed. Exercise both hips, even if you have had only one joint replacement. These exercises can be done on a training (exercise) mat, on the floor, on a table or on a bed. Use whatever works the best and is most comfortable for you. Use music or television while you are exercising so that the exercises are a pleasant break in your day. This will make your life better with the exercises acting as a break in routine you can look forward to.  Lying on your back, slowly slide your foot toward your buttocks, raising your knee up off the floor. Then slowly  slide your foot back down until your leg is straight again.  Lying on your back spread your legs as far apart as you can without causing discomfort.  Lying on your side, raise your upper leg and foot straight up from the floor as far as is comfortable. Slowly lower the leg and repeat.  Lying on your back, tighten up the muscle in the front of your thigh (quadriceps muscles). You can do this by keeping your leg straight and trying to raise your heel off the floor. This helps strengthen the largest muscle supporting your knee.  Lying on your back, tighten up the muscles of your buttocks both with the legs straight and with the knee bent at a comfortable angle while keeping your heel on the floor.   IF YOU ARE TRANSFERRED TO A SKILLED REHAB FACILITY If the patient is transferred to a skilled rehab facility following release from the hospital, a list of the current medications will be sent to the facility for the patient to continue.  When discharged from the skilled rehab facility, please have the facility set up the patient's Home Health Physical Therapy prior to being released. Also, the skilled facility will be responsible for providing the patient with their medications at time of release from the facility to include their pain medication, the muscle relaxants, and their blood thinner medication. If the patient is still at the rehab facility at time of the two week follow up appointment, the skilled rehab facility will also need to assist the patient in arranging follow up appointment in our office and any transportation needs.  MAKE SURE YOU:  Understand these instructions.  Get help right away if you are not doing well or get worse.   Pick up stool softner and laxative for home use following surgery while on pain medications. Do not submerge incision under water. Please use good hand washing techniques while changing dressing each day. May shower starting three days after surgery. Please use a  clean towel to pat the incision dry following showers. Continue to use ice for pain and swelling after surgery. Do not use any lotions or creams on the incision until instructed by your surgeon. 

## 2022-06-05 NOTE — Op Note (Unsigned)
NAME: Tiffany Leonard, JUNCO MEDICAL RECORD NO: [id number] ACCOUNT NO: [id number] DATE OF BIRTH: April 03, 1986 FACILITY: [facility] LOCATION: [id number] PHYSICIAN: Gus Rankin. 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  POSTOPERATIVE DIAGNOSIS:  Status post resection arthroplasty, right hip secondary to infection.  PROCEDURE:  Right total hip arthroplasty, reimplantation.  SURGEON:  Gus Rankin. Noelly Lasseigne, MD  ASSISTANT:  Arther Abbott, PA-C  ANESTHESIA:  Spinal.  ESTIMATED BLOOD LOSS:  550 mL.  DRAINS:  None.  COMPLICATIONS:  None.  CONDITION:  Stable to recovery.  BRIEF CLINICAL NOTE:  The patient is a 36 year old female history of an infection of her native right hip.  She had a previous resection arthroplasty, with placement of antibiotic spacer.  Upon a previous attempt at reimplantation her intraoperative Gram  stain was positive, so she had a second spacer placed.  She has had a recent aspiration and recent labs all normal.  She presents now for reimplantation of her right hip.  DESCRIPTION OF PROCEDURE:  After successful administration of a spinal anesthetic, the patient was placed in the left lateral decubitus position with the right side up and held with a hip positioner.  Right lower extremity was isolated from her perineum  with plastic drapes then prepped and draped in the usual sterile fashion.  Previous posterolateral incisions were utilized.  Skin cut with a 10 blade through subcutaneous tissue to the fascia lata, which was incised in line with the skin incision.   Sciatic nerve was palpated and protected.  Posterior pseudocapsule was incised off the posterior femur to gain access to the hip joint.  Small amount of fluid within the joint was sent for stat Gram stain, which was negative.  The hip was then dislocated  and the femoral head removed.  I removed the femoral  stem, but the cement remained intact in the canal.  We then decided to do the acetabular preparation.  The acetabular retractors were placed to gain access to the acetabulum.  Using an osteotome to remove all polyethylene acetabular component from the cement mantle.  We then used osteotomes to remove the cement mantle.  We did not sustain any bone loss  upon removal of the acetabular construct.  I then started reaming at 47 mm coursing in increments of 2 up to 51 mm.  Then, a 52 mm Pinnacle acetabular shell was placed in anatomic position.  We did not need any additional dome screws.  The 32 mm neutral  +4 marathon liner was then impacted into the acetabular shell.  We addressed the femur.  Osteotomes were used to remove the cement from the femoral canal.  Once all the cement was removed we ensured that we were in the canal by placing a sound down the canal and all 4 walls were intact.  Given that the proximal bone  was normal I decided to do a primary hip stem and used the ACTIS.  I started broaching at size going up to a size 5, which had a great fit in the femoral canal.  I used a high offset neck.  We placed a 32+5 head.  Hip was reduced with outstanding  stability.  There was full extension, full external rotation and 70 degrees flexion, 40 degrees, adduction 90 degrees internal rotation and 90 degrees of flexion and 70 degrees of internal rotation.  By placing the right leg on top of the left.  I felt  as though the leg lengths were equal.  The hip was then dislocated and the trial components were removed.  The ACTIS size 5 femoral stem was then impacted into the femoral canal matching native anteversion with excellent fit.  We then placed a 32+5  ceramic head and the hip was reduced with the same stability parameters.  Wound was copiously irrigated with saline solution.  Then, the posterior capsule reattached to the femur through drill holes using Ethibond suture.  Fascia lata is closed with a   running #1 V-Loc suture.  Subcutaneous was closed with interrupted #1 Vicryl and then interrupted 2-0 Vicryl.  Subcuticular closed with running 4-0 Monocryl.  The incisions cleaned and dried and Steri-Strips and a sterile dressing are applied.  She was  placed into a knee immobilizer, awakened, and transported to recovery in stable condition.  Note that a surgical assistant was of medical necessity for this procedure.  Surgical assistant was necessary for retraction of vital neurovascular structures and for proper positioning of the limb for safe removal of the old implant and safe and  accurate placement of the new implant.   PUS D: 06/05/2022 2:54:57 pm T: 06/05/2022 8:25:00 pm  JOB: [id number]/ [id number]

## 2022-06-06 ENCOUNTER — Encounter (HOSPITAL_COMMUNITY): Payer: Self-pay | Admitting: Orthopedic Surgery

## 2022-06-06 LAB — CBC
HCT: 30.9 % — ABNORMAL LOW (ref 36.0–46.0)
Hemoglobin: 10 g/dL — ABNORMAL LOW (ref 12.0–15.0)
MCH: 29.7 pg (ref 26.0–34.0)
MCHC: 32.4 g/dL (ref 30.0–36.0)
MCV: 91.7 fL (ref 80.0–100.0)
Platelets: 179 10*3/uL (ref 150–400)
RBC: 3.37 MIL/uL — ABNORMAL LOW (ref 3.87–5.11)
RDW: 14.2 % (ref 11.5–15.5)
WBC: 10.9 10*3/uL — ABNORMAL HIGH (ref 4.0–10.5)
nRBC: 0 % (ref 0.0–0.2)

## 2022-06-06 LAB — BASIC METABOLIC PANEL
Anion gap: 5 (ref 5–15)
BUN: 14 mg/dL (ref 6–20)
CO2: 24 mmol/L (ref 22–32)
Calcium: 8.3 mg/dL — ABNORMAL LOW (ref 8.9–10.3)
Chloride: 109 mmol/L (ref 98–111)
Creatinine, Ser: 0.68 mg/dL (ref 0.44–1.00)
GFR, Estimated: >60 mL/min (ref >60)
Glucose, Bld: 116 mg/dL — ABNORMAL HIGH (ref 70–99)
Potassium: 3.7 mmol/L (ref 3.5–5.1)
Sodium: 138 mmol/L (ref 135–145)

## 2022-06-06 MED ORDER — OXYCODONE HCL 5 MG PO TABS: 5.0000 mg | ORAL_TABLET | Freq: Four times a day (QID) | ORAL | 0 refills | Status: DC | PRN

## 2022-06-06 MED ORDER — METHOCARBAMOL 500 MG PO TABS: 500.0000 mg | ORAL_TABLET | Freq: Four times a day (QID) | ORAL | 0 refills | Status: DC | PRN

## 2022-06-06 MED ORDER — OXYCODONE HCL 5 MG PO TABS
5.0000 mg | ORAL_TABLET | ORAL | Status: DC | PRN
  Administered 2022-06-06: 10 mg via ORAL
  Filled 2022-06-06: qty 2

## 2022-06-06 MED ORDER — ASPIRIN 325 MG PO TBEC: 325.0000 mg | DELAYED_RELEASE_TABLET | Freq: Two times a day (BID) | ORAL | 0 refills | Status: AC

## 2022-06-06 MED ORDER — TRAMADOL HCL 50 MG PO TABS: 50.0000 mg | ORAL_TABLET | Freq: Four times a day (QID) | ORAL | 0 refills | Status: DC | PRN

## 2022-06-06 NOTE — Progress Notes (Signed)
Physical Therapy Treatment Patient Details Name: Tiffany Leonard MRN: [id number] DOB: 1985/11/29 Today's Date: 06/06/2022   History of Present Illness Pt s/p reimplantation of R THR by posterior approach.    PT Comments    Pt performed HEP with assist and with written instruction provided and reviewed.  Car transfers reviewed.  Pt eager for dc home.   Recommendations for follow up therapy are one component of a multi-disciplinary discharge planning process, led by the attending physician.  Recommendations may be updated based on patient status, additional functional criteria and insurance authorization.  Follow Up Recommendations  Follow physician's recommendations for discharge plan and follow up therapies     Assistance Recommended at Discharge Set up Supervision/Assistance  Patient can return home with the following A little help with walking and/or transfers;A little help with bathing/dressing/bathroom;Assist for transportation;Assistance with cooking/housework   Equipment Recommendations  Rolling walker (2 wheels)    Recommendations for Other Services       Precautions / Restrictions Precautions Precautions: Posterior Hip Precaution Booklet Issued: Yes (comment) Restrictions Weight Bearing Restrictions: No RLE Weight Bearing: Weight bearing as tolerated     Mobility  Bed Mobility Overal bed mobility: Needs Assistance Bed Mobility: Supine to Sit, Sit to Supine     Supine to sit: Min guard, Supervision Sit to supine: Min guard, Supervision   General bed mobility comments: cues for sequence and adherence to THP    Transfers Overall transfer level: Needs assistance Equipment used: Rolling walker (2 wheels) Transfers: Sit to/from Stand Sit to Stand: Min guard, Supervision           General transfer comment: cues for LE management and use of UEs to self assist    Ambulation/Gait Ambulation/Gait assistance: Min guard, Supervision Gait Distance  (Feet): 140 Feet (and additional 50' to do stairs) Assistive device: Rolling walker (2 wheels) Gait Pattern/deviations: Step-to pattern, Shuffle Gait velocity: decr     General Gait Details: cues for sequence, posture and position from RW   Stairs Stairs: Yes Stairs assistance: Min guard Stair Management: One rail Right, Forwards, Step to pattern Number of Stairs: 3 General stair comments: min cues for sequence; spouse assisting on L side   Wheelchair Mobility    Modified Rankin (Stroke Patients Only)       Balance Overall balance assessment: Needs assistance Sitting-balance support: No upper extremity supported, Feet supported Sitting balance-Leahy Scale: Good     Standing balance support: No upper extremity supported Standing balance-Leahy Scale: Fair                              Cognition Arousal/Alertness: Awake/alert Behavior During Therapy: WFL for tasks assessed/performed Overall Cognitive Status: Within Functional Limits for tasks assessed                                          Exercises Total Joint Exercises Ankle Circles/Pumps: AROM, Both, 15 reps, Supine Quad Sets: AROM, Both, 10 reps, Supine Heel Slides: AAROM, Right, 15 reps, Supine Hip ABduction/ADduction: AAROM, Right, 15 reps Long Arc Quad: AAROM, Right, 10 reps, Seated    General Comments        Pertinent Vitals/Pain Pain Assessment Pain Assessment: 0-10 Pain Score: 5  Pain Location: R hip hip/thigh Pain Descriptors / Indicators: Aching, Sore, Spasm Pain Intervention(s): Limited activity within patient's tolerance, Monitored during session, Premedicated  before session    Home Living Family/patient expects to be discharged to:: Private residence Living Arrangements: Spouse/significant other Available Help at Discharge: Family;Available 24 hours/day Type of Home: House Home Access: Stairs to enter Entrance Stairs-Rails: Right;Left;Can reach both Entrance  Stairs-Number of Steps: 6   Home Layout: One level Home Equipment: Rollator (4 wheels);BSC/3in1 Additional Comments: pt has a 35 year old and 35 year old at home, 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    Prior Function            PT Goals (current goals can now be found in the care plan section) Acute Rehab PT Goals Patient Stated Goal: Regain IND PT Goal Formulation: With patient Time For Goal Achievement: 06/13/22 Potential to Achieve Goals: Good Progress towards PT goals: Progressing toward goals    Frequency    7X/week      PT Plan Current plan remains appropriate    Co-evaluation              AM-PAC PT "6 Clicks" Mobility   Outcome Measure  Help needed turning from your back to your side while in a flat bed without using bedrails?: A Little Help needed moving from lying on your back to sitting on the side of a flat bed without using bedrails?: A Little Help needed moving to and from a bed to a chair (including a wheelchair)?: A Little Help needed standing up from a chair using your arms (e.g., wheelchair or bedside chair)?: A Little Help needed to walk in hospital room?: A Little Help needed climbing 3-5 steps with a railing? : A Little 6 Click Score: 18    End of Session Equipment Utilized During Treatment: Gait belt Activity Tolerance: Patient tolerated treatment well Patient left: in bed;with call bell/phone within reach;with family/visitor present Nurse Communication: Mobility status PT Visit Diagnosis: Difficulty in walking, not elsewhere classified (R26.2)     Time: 1043-1100 PT Time Calculation (min) (ACUTE ONLY): 17 min  Charges:  $Gait Training: 8-22 mins $Therapeutic Exercise: 8-22 mins                     Mauro Kaufmann PT Acute Rehabilitation Services Pager [phone] Office [phone]    Alnita Aybar 06/06/2022, 12:32 PM

## 2022-06-06 NOTE — Evaluation (Signed)
Physical Therapy Evaluation Patient Details Name: Tiffany Leonard MRN: [id number] DOB: 14-Feb-1986 Today's Date: 06/06/2022  History of Present Illness  Pt s/p reimplantation of R THR by posterior approach.  Clinical Impression  Pt s/p re-implantation of R THR and presents with functional mobility limitations 2* decreased R LE strength/ROM, post op pain and posterior THP.  Pt should progress to dc home with family assist.     Recommendations for follow up therapy are one component of a multi-disciplinary discharge planning process, led by the attending physician.  Recommendations may be updated based on patient status, additional functional criteria and insurance authorization.  Follow Up Recommendations Follow physician's recommendations for discharge plan and follow up therapies      Assistance Recommended at Discharge Set up Supervision/Assistance  Patient can return home with the following  A little help with walking and/or transfers;A little help with bathing/dressing/bathroom;Assist for transportation;Assistance with cooking/housework    Equipment Recommendations Rolling walker (2 wheels)  Recommendations for Other Services       Functional Status Assessment Patient has had a recent decline in their functional status and demonstrates the ability to make significant improvements in function in a reasonable and predictable amount of time.     Precautions / Restrictions Precautions Precautions: Posterior Hip Precaution Booklet Issued: Yes (comment) Restrictions Weight Bearing Restrictions: No RLE Weight Bearing: Weight bearing as tolerated      Mobility  Bed Mobility Overal bed mobility: Needs Assistance Bed Mobility: Supine to Sit, Sit to Supine     Supine to sit: Min guard, Supervision Sit to supine: Min guard, Supervision   General bed mobility comments: cues for sequence and adherence to THP    Transfers Overall transfer level: Needs  assistance Equipment used: Rolling walker (2 wheels) Transfers: Sit to/from Stand Sit to Stand: Min guard, Supervision           General transfer comment: cues for LE management and use of UEs to self assist    Ambulation/Gait Ambulation/Gait assistance: Min guard, Supervision Gait Distance (Feet): 140 Feet (and additional 50' to do stairs) Assistive device: Rolling walker (2 wheels) Gait Pattern/deviations: Step-to pattern, Shuffle Gait velocity: decr     General Gait Details: cues for sequence, posture and position from RW  Stairs Stairs: Yes Stairs assistance: Min guard Stair Management: One rail Right, Forwards, Step to pattern Number of Stairs: 3 General stair comments: min cues for sequence; spouse assisting on L side  Wheelchair Mobility    Modified Rankin (Stroke Patients Only)       Balance Overall balance assessment: Needs assistance Sitting-balance support: No upper extremity supported, Feet supported Sitting balance-Leahy Scale: Good     Standing balance support: No upper extremity supported Standing balance-Leahy Scale: Fair                               Pertinent Vitals/Pain Pain Assessment Pain Assessment: 0-10 Pain Score: 5  Pain Location: R hip hip/thigh Pain Descriptors / Indicators: Aching, Sore, Spasm Pain Intervention(s): Limited activity within patient's tolerance, Monitored during session, Premedicated before session, Ice applied    Home Living Family/patient expects to be discharged to:: Private residence Living Arrangements: Spouse/significant other Available Help at Discharge: Family;Available 24 hours/day Type of Home: House Home Access: Stairs to enter Entrance Stairs-Rails: Right;Left;Can reach both Entrance Stairs-Number of Steps: 6   Home Layout: One level Home Equipment: Rollator (4 wheels);BSC/3in1 Additional Comments: pt has a 35 year old and 35  year old at home, 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    Prior Function Prior Level of Function : Independent/Modified Independent                     Hand Dominance        Extremity/Trunk Assessment   Upper Extremity Assessment Upper Extremity Assessment: Overall WFL for tasks assessed    Lower Extremity Assessment Lower Extremity Assessment: RLE deficits/detail RLE Deficits / Details: AAROM at hip to 80 flex and 15 abd; 2/5 strength at hip    Cervical / Trunk Assessment Cervical / Trunk Assessment: Normal  Communication   Communication: No difficulties  Cognition Arousal/Alertness: Awake/alert Behavior During Therapy: WFL for tasks assessed/performed Overall Cognitive Status: Within Functional Limits for tasks assessed                                          General Comments      Exercises     Assessment/Plan    PT Assessment Patient needs continued PT services  PT Problem List Decreased strength;Decreased range of motion;Decreased activity tolerance;Decreased balance;Decreased mobility;Decreased knowledge of use of DME;Pain       PT Treatment Interventions DME instruction;Gait training;Stair training;Functional mobility training;Therapeutic activities;Therapeutic exercise;Balance training;Patient/family education    PT Goals (Current goals can be found in the Care Plan section)  Acute Rehab PT Goals Patient Stated Goal: Regain IND PT Goal Formulation: With patient Time For Goal Achievement: 06/13/22 Potential to Achieve Goals: Good    Frequency 7X/week     Co-evaluation               AM-PAC PT "6 Clicks" Mobility  Outcome Measure Help needed turning from your back to your side while in a flat bed without using bedrails?: A Little Help needed moving from lying on your back to sitting on the side of a flat bed without using bedrails?: A Little Help needed moving to and from a bed to a chair (including a wheelchair)?: A Little Help needed  standing up from a chair using your arms (e.g., wheelchair or bedside chair)?: A Little Help needed to walk in hospital room?: A Little Help needed climbing 3-5 steps with a railing? : A Little 6 Click Score: 18    End of Session Equipment Utilized During Treatment: Gait belt Activity Tolerance: Patient tolerated treatment well Patient left: in bed;with call bell/phone within reach;with family/visitor present Nurse Communication: Mobility status PT Visit Diagnosis: Difficulty in walking, not elsewhere classified (R26.2)    Time: [id number] PT Time Calculation (min) (ACUTE ONLY): 31 min   Charges:   PT Evaluation $PT Eval Low Complexity: 1 Low PT Treatments $Gait Training: 8-22 mins        Debe Coder PT Acute Rehabilitation Services Pager [phone] Office [phone]   Adriona Kaney 06/06/2022, 12:24 PM

## 2022-06-06 NOTE — Plan of Care (Signed)
Patient discharged home via private vehicle. Ivan Anchors, RN 06/06/22 12:17 PM

## 2022-06-06 NOTE — Progress Notes (Signed)
   Subjective: 1 Day Post-Op Procedure(s) (LRB): REIMPLANTATION OF TOTAL HIP-POSTERIOR (Right) Patient reports pain as moderate.   Patient seen in rounds by Dr. Wynelle Link. Patient is well, and has had no acute complaints or problems other than pain in the hip despite use of hydrocodone. Will switch PO medication to oxy. Denies chest pain or SOB. No issues overnight.  We will begin therapy today.  Objective: Vital signs in last 24 hours: Temp:  [97.7 F (36.5 C)-99 F (37.2 C)] 98.3 F (36.8 C) (10/19 0549) Pulse Rate:  [69-102] 69 (10/19 0549) Resp:  [11-30] 18 (10/19 0549) BP: (113-133)/(60-87) 120/81 (10/19 0549) SpO2:  [99 %-100 %] 100 % (10/19 0549) Weight:  [75.8 kg] 75.8 kg (10/18 1133)  Intake/Output from previous day:  Intake/Output Summary (Last 24 hours) at 06/06/2022 0743 Last data filed at 06/06/2022 0644 Gross per 24 hour  Intake 3423.38 ml  Output 2800 ml  Net 623.38 ml     Intake/Output this shift: No intake/output data recorded.  Labs: Recent Labs    06/06/22 0346  HGB 10.0*   Recent Labs    06/06/22 0346  WBC 10.9*  RBC 3.37*  HCT 30.9*  PLT 179   Recent Labs    06/06/22 0346  NA 138  K 3.7  CL 109  CO2 24  BUN 14  CREATININE 0.68  GLUCOSE 116*  CALCIUM 8.3*   No results for input(s): "LABPT", "INR" in the last 72 hours.  Exam: General - Patient is Alert and Oriented Extremity - Neurologically intact Neurovascular intact Sensation intact distally Dorsiflexion/Plantar flexion intact Dressing - dressing C/D/I Motor Function - intact, moving foot and toes well on exam.   Past Medical History:  Diagnosis Date   Anxiety    Arthritis     Assessment/Plan: 1 Day Post-Op Procedure(s) (LRB): REIMPLANTATION OF TOTAL HIP-POSTERIOR (Right) Principal Problem:   History of removal of joint prosthesis of hip due to infection  Estimated body mass index is 23.96 kg/m as calculated from the following:   Height as of this encounter: 5'  10" (1.778 m).   Weight as of this encounter: 75.8 kg. Advance diet Up with therapy D/C IV fluids  DVT Prophylaxis - Aspirin Weight bearing as tolerated. Begin therapy.  Posterior hip precautions. Discontinue knee immobilizer.  Plan is to go Home after hospital stay. Plan for discharge with HEP later today if progresses with therapy and meeting goals. Follow-up in the office in 2 weeks.  The PDMP database was reviewed today prior to any opioid medications being prescribed to this patient.  Theresa Duty, PA-C Orthopedic Surgery [phone] 06/06/2022, 7:43 AM

## 2022-06-06 NOTE — TOC Transition Note (Signed)
Transition of Care [facility]) - CM/SW Discharge Note   Patient Details  Name: Tiffany Leonard MRN: [id number] Date of Birth: 1985/11/09  Transition of Care [facility]) CM/SW Contact:  Lennart Pall, LCSW Phone Number: 06/06/2022, 11:55 AM   Clinical Narrative:    Have confirmed with pt the need for a RW and no DME agency preference - order placed with [facility] and has been delivered to room. Plan for HEP.  No further TOC needs.   Final next level of care: Home/Self Care Barriers to Discharge: No Barriers Identified   Patient Goals and CMS Choice Patient states their goals for this hospitalization and ongoing recovery are:: return home      Discharge Placement                       Discharge Plan and Services                DME Arranged: Walker rolling DME Agency: [employer] Date DME Agency Contacted: 06/06/22 Time DME Agency Contacted: 1100 Representative spoke with at DME Agency: [facility] ([place]) Interventions     Readmission Risk Interventions    06/06/2022   11:54 AM 09/12/2020    3:28 PM  Readmission Risk Prevention Plan  Post Dischage Appt Complete Complete  Medication Screening Complete Complete  Transportation Screening Complete Complete

## 2022-06-06 NOTE — Plan of Care (Signed)
  Problem: Activity: Goal: Ability to tolerate increased activity will improve Outcome: Progressing   

## 2022-06-06 NOTE — Plan of Care (Signed)
Problem: Education: Goal: Knowledge of the prescribed therapeutic regimen will improve Outcome: Progressing   Problem: Activity: Goal: Ability to avoid complications of mobility impairment will improve Outcome: Progressing   Problem: Clinical Measurements: Goal: Postoperative complications will be avoided or minimized Outcome: Progressing   Problem: Pain Management: Goal: Pain level will decrease with appropriate interventions Outcome: [place], RN 06/06/22 10:56 AM

## 2022-06-10 LAB — AEROBIC/ANAEROBIC CULTURE W GRAM STAIN (SURGICAL/DEEP WOUND)
Culture: NO GROWTH
Gram Stain: NONE SEEN

## 2022-06-11 NOTE — Discharge Summary (Signed)
Patient ID: Tiffany Leonard MRN: [id number] DOB/AGE: 1985/11/18 36 y.o.  Admit date: 06/05/2022 Discharge date: 06/06/2022  Admission Diagnoses:  Principal Problem:   History of removal of joint prosthesis of hip due to infection   Discharge Diagnoses:  Same  Past Medical History:  Diagnosis Date   Anxiety    Arthritis     Surgeries: Procedure(s): REIMPLANTATION OF TOTAL HIP-POSTERIOR on 06/05/2022   Consultants:   Discharged Condition: Improved  Hospital Course: Noriah Osgood is an 35 y.o. female who was admitted 06/05/2022 for operative treatment ofHistory of removal of joint prosthesis of hip due to infection. Patient has severe unremitting pain that affects sleep, daily activities, and work/hobbies. After pre-op clearance the patient was taken to the operating room on 06/05/2022 and underwent  Procedure(s): REIMPLANTATION OF TOTAL HIP-POSTERIOR.    Patient was given perioperative antibiotics:  Anti-infectives (From admission, onward)    Start     Dose/Rate Route Frequency Ordered Stop   06/05/22 1830  ceFAZolin (ANCEF) IVPB 2g/100 mL premix        2 g 200 mL/hr over 30 Minutes Intravenous Every 6 hours 06/05/22 1740 06/06/22 0701   06/05/22 1130  ceFAZolin (ANCEF) IVPB 2g/100 mL premix        2 g 200 mL/hr over 30 Minutes Intravenous On call to O.R. 06/05/22 1122 06/05/22 1231        Patient was given sequential compression devices, early ambulation, and chemoprophylaxis to prevent DVT.  Patient benefited maximally from hospital stay and there were no complications.    Recent vital signs: No data found.   Recent laboratory studies: No results for input(s): "WBC", "HGB", "HCT", "PLT", "NA", "K", "CL", "CO2", "BUN", "CREATININE", "GLUCOSE", "INR", "CALCIUM" in the last 72 hours.  Invalid input(s): "PT", "2"   Discharge Medications:   Allergies as of 06/06/2022       Reactions   Banana Itching   Pt reports itching in mouth and  throat        Medication List     STOP taking these medications    HYDROcodone-acetaminophen 5-325 MG tablet Commonly known as: NORCO/VICODIN   Norethindrone-Ethinyl Estradiol-Fe Biphas 1 MG-10 MCG / 10 MCG tablet Commonly known as: LO LOESTRIN FE   Phexxi 1.8-1-0.4 % Gel Generic drug: Lactic Ac-Citric Ac-Pot Bitart       TAKE these medications    acetaminophen 500 MG tablet Commonly known as: TYLENOL Take 1,000 mg by mouth every 6 (six) hours as needed for mild pain or headache.   ALPRAZolam 0.25 MG tablet Commonly known as: XANAX TAKE 1 TABLET BY MOUTH AT BEDTIME AS NEEDED FOR ANXIETY. What changed: See the new instructions.   aspirin EC 325 MG tablet Take 1 tablet (325 mg total) by mouth 2 (two) times daily for 20 days. Then take one 81 mg aspirin once a day for three weeks. Then discontinue aspirin.   methocarbamol 500 MG tablet Commonly known as: ROBAXIN Take 1 tablet (500 mg total) by mouth every 6 (six) hours as needed for muscle spasms.   oxyCODONE 5 MG immediate release tablet Commonly known as: Oxy IR/ROXICODONE Take 1-2 tablets (5-10 mg total) by mouth every 6 (six) hours as needed for severe pain.   sertraline 100 MG tablet Commonly known as: ZOLOFT Take 1 tablet (100 mg total) by mouth daily.   traMADol 50 MG tablet Commonly known as: ULTRAM Take 1-2 tablets (50-100 mg total) by mouth every 6 (six) hours as needed for moderate pain.  Discharge Care Instructions  (From admission, onward)           Start     Ordered   06/06/22 0000  Weight bearing as tolerated        06/06/22 0746   06/06/22 0000  Change dressing       Comments: You have an adhesive waterproof bandage over the incision. Leave this in place until your first follow-up appointment. Once you remove this you will not need to place another bandage.   06/06/22 0746            Diagnostic Studies: DG Pelvis Portable  Result Date: 06/05/2022 CLINICAL  DATA:  Status post revision of right hip arthroplasty EXAM: PORTABLE PELVIS 1-2 VIEWS COMPARISON:  11/21/2021 FINDINGS: There is interval revision of right hip arthroplasty. There are pockets of air in the soft tissues. Deformities in both inferior pubic rami and left superior pubic ramus have not changed. IMPRESSION: Interval revision of right hip arthroplasty. Electronically Signed   By: Ernie Avena M.D.   On: 06/05/2022 16:16    Disposition: Discharge disposition: 01-Home or Self Care       Discharge Instructions     Call MD / Call 911   Complete by: As directed    If you experience chest pain or shortness of breath, CALL 911 and be transported to the hospital emergency room.  If you develope a fever above 101 F, pus (white drainage) or increased drainage or redness at the wound, or calf pain, call your surgeon's office.   Change dressing   Complete by: As directed    You have an adhesive waterproof bandage over the incision. Leave this in place until your first follow-up appointment. Once you remove this you will not need to place another bandage.   Constipation Prevention   Complete by: As directed    Drink plenty of fluids.  Prune juice may be helpful.  You may use a stool softener, such as Colace (over the counter) 100 mg twice a day.  Use MiraLax (over the counter) for constipation as needed.   Diet - low sodium heart healthy   Complete by: As directed    Do not sit on low chairs, stoools or toilet seats, as it may be difficult to get up from low surfaces   Complete by: As directed    Driving restrictions   Complete by: As directed    No driving for two weeks   Follow the hip precautions as taught in Physical Therapy   Complete by: As directed    Post-operative opioid taper instructions:   Complete by: As directed    POST-OPERATIVE OPIOID TAPER INSTRUCTIONS: It is important to wean off of your opioid medication as soon as possible. If you do not need pain medication  after your surgery it is ok to stop day one. Opioids include: Codeine, Hydrocodone(Norco, Vicodin), Oxycodone(Percocet, oxycontin) and hydromorphone amongst others.  Long term and even short term use of opiods can cause: Increased pain response Dependence Constipation Depression Respiratory depression And more.  Withdrawal symptoms can include Flu like symptoms Nausea, vomiting And more Techniques to manage these symptoms Hydrate well Eat regular healthy meals Stay active Use relaxation techniques(deep breathing, meditating, yoga) Do Not substitute Alcohol to help with tapering If you have been on opioids for less than two weeks and do not have pain than it is ok to stop all together.  Plan to wean off of opioids This plan should start within one week post  op of your joint replacement. Maintain the same interval or time between taking each dose and first decrease the dose.  Cut the total daily intake of opioids by one tablet each day Next start to increase the time between doses. The last dose that should be eliminated is the evening dose.      TED hose   Complete by: As directed    Use stockings (TED hose) for three weeks on both leg(s).  You may remove them at night for sleeping.   Weight bearing as tolerated   Complete by: As directed         Follow-up Information     Aluisio, Homero Fellers, MD. Schedule an appointment as soon as possible for a visit in 2 week(s).   Specialty: Orthopedic Surgery Contact information: [address] [place] [address] [place] [address] [phone]                  Signed: Arther Abbott 06/11/2022, 6:52 AM

## 2022-06-17 ENCOUNTER — Other Ambulatory Visit: Payer: Self-pay | Admitting: Radiology

## 2022-06-17 DIAGNOSIS — F418 Other specified anxiety disorders: Secondary | ICD-10-CM

## 2022-06-18 NOTE — Telephone Encounter (Signed)
Medication refill request: xanax  Last AEX:  11/13/21 Next AEX: nothing scheduled  Last MMG (if hormonal medication request): n/a Refill authorized: #30 with 1 rf pended

## 2022-07-12 IMAGING — DX DG PORTABLE PELVIS
1 series · 1 of 1 positions shown · non-contrast
Comparison: Radiograph 01/01/2021

CLINICAL DATA: Postop hip surgery

EXAM:
PORTABLE PELVIS 1-2 VIEWS

[pelvis ap]
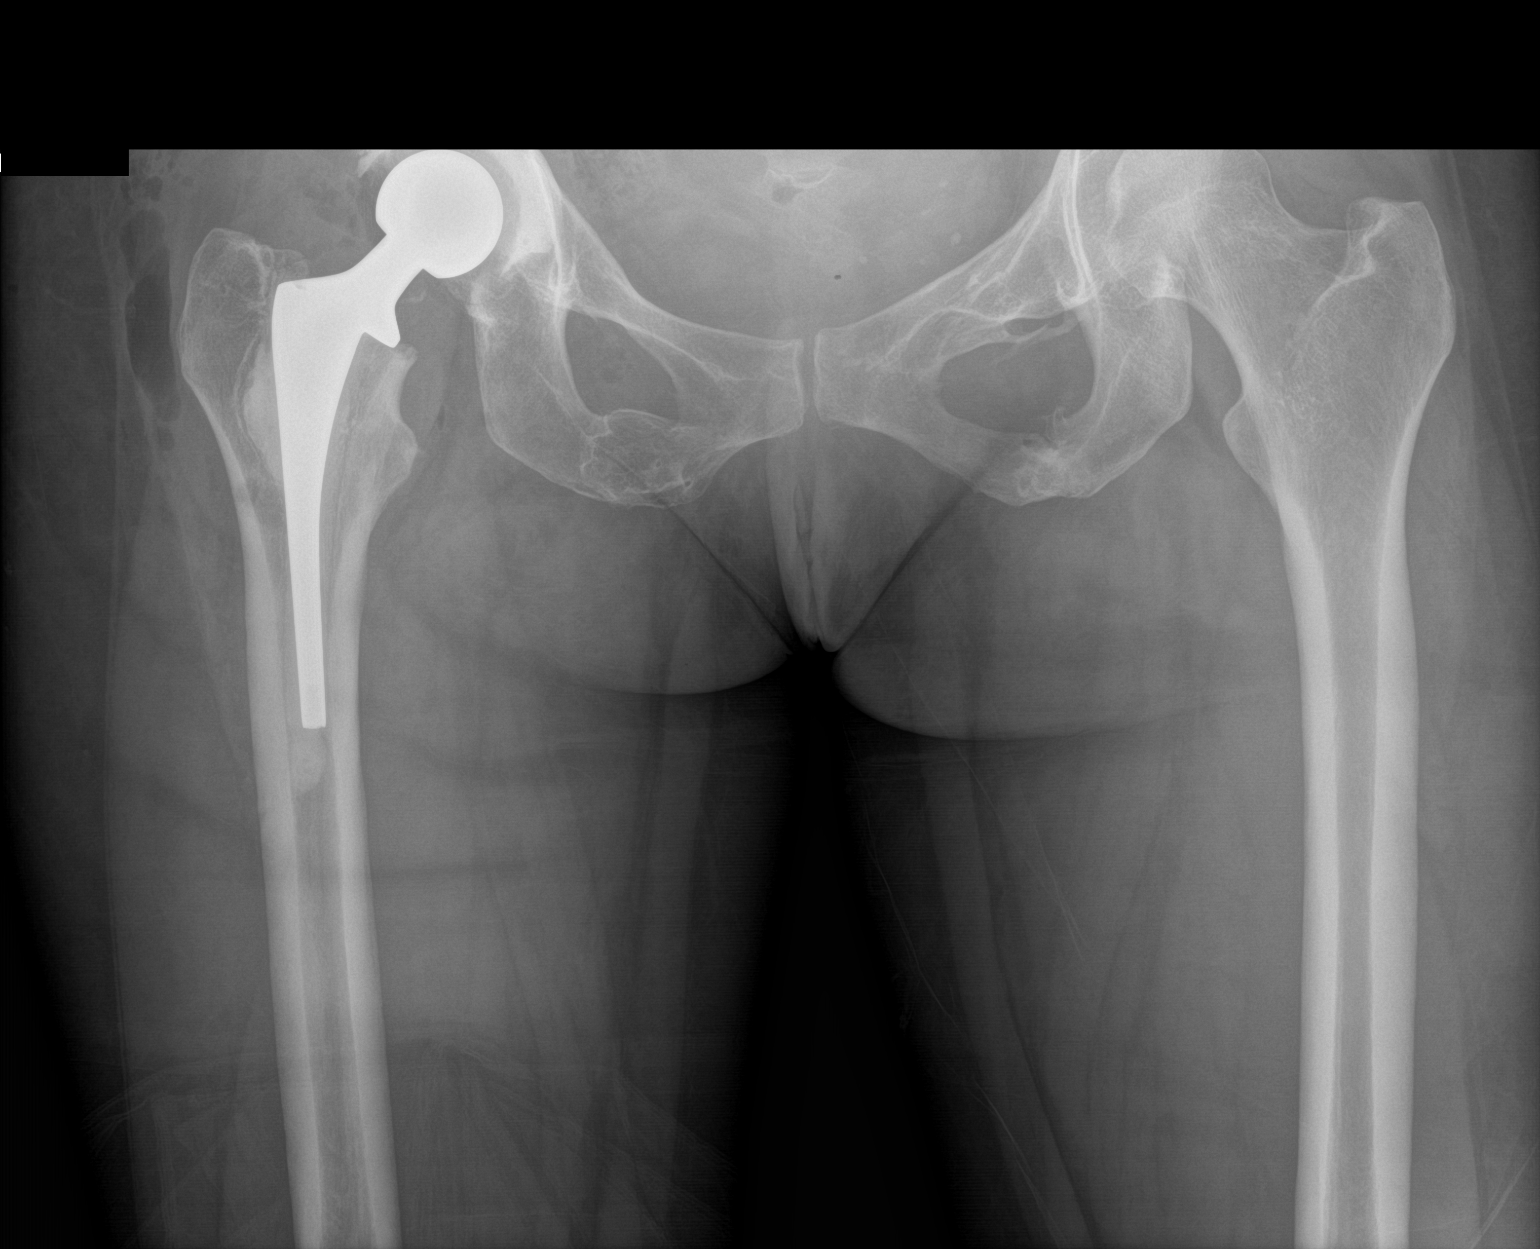

[1 of 1 positions shown; findings below may reference images not displayed]

FINDINGS: Postsurgical changes of right hip excision with antibiotic spacer
exchange. Expected soft tissue changes. Normal alignment. No
evidence of loosening or acute fracture.
IMPRESSION: Postsurgical changes of right hip antibiotic spacer exchange. No
evidence of immediate complication.

## 2022-08-15 DIAGNOSIS — N911 Secondary amenorrhea: Secondary | ICD-10-CM | POA: Diagnosis not present

## 2022-08-19 NOTE — L&D Delivery Note (Signed)
Delivery Note  SVD viable female Apgars 4,9,  over 1st degree lac.  Nuchal reduced on perineum and while delivering cord avulsed 5cm from umbilicus and quickly clamped..  Placenta delivered spontaneously intact with 3VC and velamentous insertion of cord noted.. Repair with 3-0 with good support and hemostasis noted.  R/V exam confirms. .   Mother and baby to couplet care and are doing well.  EBL 222 cc  Candice Camp, MD

## 2022-08-22 DIAGNOSIS — Z3685 Encounter for antenatal screening for Streptococcus B: Secondary | ICD-10-CM | POA: Diagnosis not present

## 2022-08-22 DIAGNOSIS — Z3481 Encounter for supervision of other normal pregnancy, first trimester: Secondary | ICD-10-CM | POA: Diagnosis not present

## 2022-08-22 LAB — HEPATITIS C ANTIBODY: HCV Ab: NEGATIVE

## 2022-08-22 LAB — OB RESULTS CONSOLE HEPATITIS B SURFACE ANTIGEN: Hepatitis B Surface Ag: NEGATIVE

## 2022-08-22 LAB — OB RESULTS CONSOLE RPR: RPR: NONREACTIVE

## 2022-08-22 LAB — OB RESULTS CONSOLE RUBELLA ANTIBODY, IGM: Rubella: IMMUNE

## 2022-08-22 LAB — OB RESULTS CONSOLE HIV ANTIBODY (ROUTINE TESTING): HIV: NONREACTIVE

## 2022-08-31 ENCOUNTER — Other Ambulatory Visit: Payer: Self-pay | Admitting: Radiology

## 2022-09-03 DIAGNOSIS — Z124 Encounter for screening for malignant neoplasm of cervix: Secondary | ICD-10-CM | POA: Diagnosis not present

## 2022-09-03 DIAGNOSIS — Z3481 Encounter for supervision of other normal pregnancy, first trimester: Secondary | ICD-10-CM | POA: Diagnosis not present

## 2022-09-03 DIAGNOSIS — Z1151 Encounter for screening for human papillomavirus (HPV): Secondary | ICD-10-CM | POA: Diagnosis not present

## 2022-09-03 DIAGNOSIS — Z113 Encounter for screening for infections with a predominantly sexual mode of transmission: Secondary | ICD-10-CM | POA: Diagnosis not present

## 2022-09-03 DIAGNOSIS — Z3A11 11 weeks gestation of pregnancy: Secondary | ICD-10-CM | POA: Diagnosis not present

## 2022-09-03 LAB — OB RESULTS CONSOLE GC/CHLAMYDIA
Chlamydia: NEGATIVE
Neisseria Gonorrhea: NEGATIVE

## 2022-09-03 NOTE — Telephone Encounter (Signed)
Rx sent with message to pharmacy needs updated AEX for future refills.   Encounter closed.

## 2022-09-03 NOTE — Telephone Encounter (Signed)
Ok to refill, advise pharmacy she needs appointment for AEX

## 2022-09-03 NOTE — Telephone Encounter (Signed)
Med refill request:sertraline 100 mg tab Last AEX: OV 11/13/21 -JC Next AEX: Not scheduled  Last Rx sent 03/26/22 #90/0RF  Last MMG (if hormonal med) N/A  Refill authorized: Please Advise?

## 2022-11-07 DIAGNOSIS — Z3A20 20 weeks gestation of pregnancy: Secondary | ICD-10-CM | POA: Diagnosis not present

## 2022-11-07 DIAGNOSIS — Z363 Encounter for antenatal screening for malformations: Secondary | ICD-10-CM | POA: Diagnosis not present

## 2022-12-05 DIAGNOSIS — O43122 Velamentous insertion of umbilical cord, second trimester: Secondary | ICD-10-CM | POA: Diagnosis not present

## 2022-12-05 DIAGNOSIS — Z362 Encounter for other antenatal screening follow-up: Secondary | ICD-10-CM | POA: Diagnosis not present

## 2022-12-05 DIAGNOSIS — Z3A24 24 weeks gestation of pregnancy: Secondary | ICD-10-CM | POA: Diagnosis not present

## 2023-01-01 ENCOUNTER — Encounter (HOSPITAL_COMMUNITY): Payer: Self-pay | Admitting: *Deleted

## 2023-01-01 ENCOUNTER — Inpatient Hospital Stay (HOSPITAL_COMMUNITY)
Admission: AD | Admit: 2023-01-01 | Discharge: 2023-01-01 | Disposition: A | Discharge status: Home / Self Care | Payer: BC Managed Care – PPO | Attending: Obstetrics & Gynecology | Admitting: Obstetrics & Gynecology

## 2023-01-01 ENCOUNTER — Other Ambulatory Visit: Payer: Self-pay

## 2023-01-01 DIAGNOSIS — Z3A28 28 weeks gestation of pregnancy: Secondary | ICD-10-CM | POA: Diagnosis not present

## 2023-01-01 DIAGNOSIS — Z23 Encounter for immunization: Secondary | ICD-10-CM | POA: Insufficient documentation

## 2023-01-01 DIAGNOSIS — Y9301 Activity, walking, marching and hiking: Secondary | ICD-10-CM | POA: Diagnosis not present

## 2023-01-01 DIAGNOSIS — M533 Sacrococcygeal disorders, not elsewhere classified: Secondary | ICD-10-CM | POA: Insufficient documentation

## 2023-01-01 DIAGNOSIS — W19XXXA Unspecified fall, initial encounter: Secondary | ICD-10-CM | POA: Diagnosis not present

## 2023-01-01 DIAGNOSIS — O26893 Other specified pregnancy related conditions, third trimester: Secondary | ICD-10-CM | POA: Insufficient documentation

## 2023-01-01 DIAGNOSIS — O43129 Velamentous insertion of umbilical cord, unspecified trimester: Secondary | ICD-10-CM | POA: Insufficient documentation

## 2023-01-01 DIAGNOSIS — Z6791 Unspecified blood type, Rh negative: Secondary | ICD-10-CM | POA: Diagnosis not present

## 2023-01-01 DIAGNOSIS — O09529 Supervision of elderly multigravida, unspecified trimester: Secondary | ICD-10-CM | POA: Insufficient documentation

## 2023-01-01 DIAGNOSIS — O361930 Maternal care for other isoimmunization, third trimester, not applicable or unspecified: Secondary | ICD-10-CM | POA: Diagnosis not present

## 2023-01-01 DIAGNOSIS — O361990 Maternal care for other isoimmunization, unspecified trimester, not applicable or unspecified: Secondary | ICD-10-CM | POA: Insufficient documentation

## 2023-01-01 DIAGNOSIS — W109XXA Fall (on) (from) unspecified stairs and steps, initial encounter: Secondary | ICD-10-CM | POA: Diagnosis not present

## 2023-01-01 DIAGNOSIS — R768 Other specified abnormal immunological findings in serum: Secondary | ICD-10-CM | POA: Insufficient documentation

## 2023-01-01 LAB — KLEIHAUER-BETKE STAIN
# Vials RhIg: 1
Fetal Cells %: 0 %
Quantitation Fetal Hemoglobin: 0 mL

## 2023-01-01 MED ORDER — RHO D IMMUNE GLOBULIN 1500 UNIT/2ML IJ SOSY
300.0000 ug | PREFILLED_SYRINGE | Freq: Once | INTRAMUSCULAR | Status: AC
  Administered 2023-01-01: 300 ug via INTRAMUSCULAR
  Filled 2023-01-01: qty 2

## 2023-01-01 NOTE — MAU Note (Signed)
.  Tiffany Leonard is a 37 y.o. at [redacted]w[redacted]d here in MAU reporting: she fell down porch stairs @ approximately 0930 this morning.  Reports she thinks she may have broken tail bone.  Denies VB or LOF.  Endorses +FM. LMP: NA Onset of complaint: today 0930 Pain score: 8 Vitals:   01/01/23 1208  BP: 117/72  Pulse: (!) 107  Resp: 19  Temp: 98.4 F (36.9 C)  SpO2: 100%     FHT:176 bpm Lab orders placed from triage:   None

## 2023-01-01 NOTE — MAU Provider Note (Signed)
History     [id number]  Arrival date and time: 01/01/23 1131    No chief complaint on file.    HPI Tiffany Leonard is a 37 y.o. at [redacted]w[redacted]d with PMHx notable for Rh negative blood type, two prior NSVD, velamentous cord insertion, traumatic injury leading to pelvic surgery and DVT, AMA, who presents after a fall.   Weather has been terrible today with significant rain fall She was going down the steps when she slipped and landed hard on her bottom and back No direct abdominal trauma This occurred around 0900 Very worried due to diagnosis of velamentous cord insertion during this pregnancy Currently reports her tail bone is intensely painful, she has broken it before and this feels similar Denies abdominal pain, vaginal bleeding or leaking fluid, or contractions Endorses normal fetal movement She does not believe she has received rhogam yet in this pregnancy She has a prenatal visit scheduled for tomorrow  --/--/AB NEG (05/15 1222)  OB History     Gravida  3   Para  2   Term  2   Preterm      AB      Living  2      SAB      IAB      Ectopic      Multiple  0   Live Births  2           Past Medical History:  Diagnosis Date   Anxiety    Arthritis     Past Surgical History:  Procedure Laterality Date   broken pelvis  2014   EXCISIONAL TOTAL HIP ARTHROPLASTY WITH ANTIBIOTIC SPACERS Right 01/01/2021   Procedure: Right hip resection arthroplasty with antibiotic spacer;  Surgeon: Ollen Gross, MD;  Location: [facility];  Service: Orthopedics;  Laterality: Right;    INCISION AND DRAINAGE HIP Right 09/11/2020   Procedure: Right hip arthrotomy, irrigation and debridement-anterior approach;  Surgeon: Ollen Gross, MD;  Location: [facility];  Service: Orthopedics;  Laterality: Right;    REIMPLANTATION OF TOTAL HIP Right 11/21/2021   Procedure: REIMPLANTATION OF TOTAL HIP;  Surgeon: Ollen Gross, MD;  Location: [facility];  Service: Orthopedics;   Laterality: Right;   REIMPLANTATION OF TOTAL HIP Right 06/05/2022   Procedure: REIMPLANTATION OF TOTAL HIP-POSTERIOR;  Surgeon: Ollen Gross, MD;  Location: [facility];  Service: Orthopedics;  Laterality: Right;   SHOULDER SURGERY  2014    Family History  Problem Relation Age of Onset   Lupus Mother    Cancer Father     Social History   Socioeconomic History   Marital status: Married    Spouse name: Not on file   Number of children: Not on file   Years of education: Not on file   Highest education level: Not on file  Occupational History   Not on file  Tobacco Use   Smoking status: Never   Smokeless tobacco: Never  Vaping Use   Vaping Use: Never used  Substance and Sexual Activity   Alcohol use: Not Currently    Comment: occas.   Drug use: Never   Sexual activity: Yes    Birth control/protection: None  Other Topics Concern   Not on file  Social History Narrative   Not on file   Social Determinants of Health   Financial Resource Strain: Not on file  Food Insecurity: Patient Declined (06/05/2022)   Hunger Vital Sign    Worried About Running Out of Food in the Last Year:  Patient declined    Barista in the Last Year: Patient declined  Transportation Needs: Patient Declined (06/05/2022)   PRAPARE - Administrator, Civil Service (Medical): Patient declined    Lack of Transportation (Non-Medical): Patient declined  Physical Activity: Not on file  Stress: Not on file  Social Connections: Not on file  Intimate Partner Violence: Patient Declined (06/05/2022)   Humiliation, Afraid, Rape, and Kick questionnaire    Fear of Current or Ex-Partner: Patient declined    Emotionally Abused: Patient declined    Physically Abused: Patient declined    Sexually Abused: Patient declined    Allergies  Allergen Reactions   Banana Itching    Pt reports itching in mouth and throat    No current facility-administered medications on file prior to encounter.    Current Outpatient Medications on File Prior to Encounter  Medication Sig Dispense Refill   Prenatal Vit-Fe Fumarate-FA (PRENATAL MULTIVITAMIN) TABS tablet Take 1 tablet by mouth daily at 12 noon.     sertraline (ZOLOFT) 100 MG tablet TAKE 1 TABLET BY MOUTH EVERY DAY (Patient taking differently: Take 50 mg by mouth daily.) 90 tablet 0   acetaminophen (TYLENOL) 500 MG tablet Take 1,000 mg by mouth every 6 (six) hours as needed for mild pain or headache.     ALPRAZolam (XANAX) 0.25 MG tablet TAKE 1 TABLET BY MOUTH AT BEDTIME AS NEEDED FOR ANXIETY 30 tablet 1   methocarbamol (ROBAXIN) 500 MG tablet Take 1 tablet (500 mg total) by mouth every 6 (six) hours as needed for muscle spasms. 40 tablet 0   oxyCODONE (OXY IR/ROXICODONE) 5 MG immediate release tablet Take 1-2 tablets (5-10 mg total) by mouth every 6 (six) hours as needed for severe pain. 42 tablet 0   traMADol (ULTRAM) 50 MG tablet Take 1-2 tablets (50-100 mg total) by mouth every 6 (six) hours as needed for moderate pain. 40 tablet 0     ROS Pertinent positives and negative per HPI, all others reviewed and negative  Physical Exam   BP 121/73   Pulse (!) 118   Temp 98.4 F (36.9 C) (Oral)   Resp 19   Ht 5\' 10"  (1.778 m)   Wt 90 kg   LMP 05/17/2022 (Approximate) Comment: Neg urine test 06/05/22  SpO2 100%   BMI 28.47 kg/m   Patient Vitals for the past 24 hrs:  BP Temp Temp src Pulse Resp SpO2 Height Weight  01/01/23 1226 121/73 -- -- (!) 118 -- -- -- --  01/01/23 1219 136/81 -- -- (!) 107 -- -- -- --  01/01/23 1208 117/72 98.4 F (36.9 C) Oral (!) 107 19 100 % -- --  01/01/23 1204 -- -- -- -- -- -- 5\' 10"  (1.778 m) 90 kg    Physical Exam Vitals reviewed.  Constitutional:      General: She is not in acute distress.    Appearance: She is well-developed. She is not diaphoretic.  Eyes:     General: No scleral icterus. Pulmonary:     Effort: Pulmonary effort is normal. No respiratory distress.  Abdominal:     General:  There is no distension.     Palpations: Abdomen is soft.     Tenderness: There is no abdominal tenderness. There is no guarding or rebound.  Skin:    General: Skin is warm and dry.  Neurological:     Mental Status: She is alert.     Coordination: Coordination normal.  Cervical Exam    Bedside Ultrasound Not performed.  My interpretation: n/a  FHT Baseline: 155 bpm Variability: Good {> 6 bpm) Accelerations: Reactive Decelerations: Absent Uterine activity: None Cat: I  Labs Results for orders placed or performed during the hospital encounter of 01/01/23 (from the past 24 hour(s))  Rh IG workup (includes ABO/Rh)     Status: None (Preliminary result)   Collection Time: 01/01/23 12:22 PM  Result Value Ref Range   Gestational Age(Wks) 28    ABO/RH(D) AB NEG    Antibody Screen POS    Antibody Identification ANTI FYA (Duffy a)    Unit Number [id number]    Blood Component Type RHIG    Unit division 00    Status of Unit REL FROM [facility]    Transfusion Status      OK TO TRANSFUSE Performed at [facility], [address], [place], [place] [address]    Unit Number [id number]    Blood Component Type RHIG    Unit division 00    Status of Unit ISSUED    Transfusion Status OK TO TRANSFUSE     Imaging No results found.  MAU Course  Procedures Lab Orders         Kleihauer-Betke stain         ANTIBODY ID, TITER, AND TYPING, RBC    Meds ordered this encounter  Medications   rho (d) immune globulin (RHIG/RHOPHYLAC) injection 300 mcg   Imaging Orders  No imaging studies ordered today    MDM Moderate (Level 3-4)  Assessment and Plan  #Fall during pregnancy, third trimester #[redacted] weeks gestation of pregnancy #Rh negative status, antepartum #Coccydynia Patient without direct abdominal trauma. NST is reactive and reassuring and we are currently at the 5 hour mark since the event. Low concern for any adverse effects on fetus. Has not yet received rhogam,  given here and KB test also sent. Discussed with patient importance of following up on the result of this to see if she needs additional rhogam, though the likelihood of this is low given the mechanism of injury. Regarding her coccydynia unfortunately there is not much to do in pregnancy outside of conservative measures such as OTC meds and donut pillows. Recommend returning to see her Ortho doctor after delivery.  #Maternal antibodies Notified by blood bank that type and screen showed new antibodies, not yet identified or titered. I spoke with Dr. Langston Masker to notify her of this new result and ensure appropriate follow up.   #FWB FHT Cat I NST: Reactive   Dispo: discharged to home in stable condition     Venora Maples, MD/MPH 01/01/23 2:35 PM  Allergies as of 01/01/2023       Reactions   Banana Itching   Pt reports itching in mouth and throat        Medication List     TAKE these medications    acetaminophen 500 MG tablet Commonly known as: TYLENOL Take 1,000 mg by mouth every 6 (six) hours as needed for mild pain or headache.   ALPRAZolam 0.25 MG tablet Commonly known as: XANAX TAKE 1 TABLET BY MOUTH AT BEDTIME AS NEEDED FOR ANXIETY   methocarbamol 500 MG tablet Commonly known as: ROBAXIN Take 1 tablet (500 mg total) by mouth every 6 (six) hours as needed for muscle spasms.   oxyCODONE 5 MG immediate release tablet Commonly known as: Oxy IR/ROXICODONE Take 1-2 tablets (5-10 mg total) by mouth every 6 (six) hours as needed for  severe pain.   prenatal multivitamin Tabs tablet Take 1 tablet by mouth daily at 12 noon.   sertraline 100 MG tablet Commonly known as: ZOLOFT TAKE 1 TABLET BY MOUTH EVERY DAY What changed: how much to take   traMADol 50 MG tablet Commonly known as: ULTRAM Take 1-2 tablets (50-100 mg total) by mouth every 6 (six) hours as needed for moderate pain.

## 2023-01-02 DIAGNOSIS — Z23 Encounter for immunization: Secondary | ICD-10-CM | POA: Diagnosis not present

## 2023-01-02 DIAGNOSIS — Z3480 Encounter for supervision of other normal pregnancy, unspecified trimester: Secondary | ICD-10-CM | POA: Diagnosis not present

## 2023-01-03 LAB — RH IG WORKUP (INCLUDES ABO/RH)
ABO/RH(D): AB NEG
Antibody Screen: POSITIVE
Gestational Age(Wks): 28
Unit division: 0
Unit division: 0

## 2023-01-06 ENCOUNTER — Encounter: Payer: Self-pay | Admitting: Family Medicine

## 2023-01-08 ENCOUNTER — Other Ambulatory Visit: Payer: Self-pay | Admitting: Obstetrics & Gynecology

## 2023-01-08 DIAGNOSIS — Z6790 Unspecified blood type, Rh positive: Secondary | ICD-10-CM

## 2023-01-15 ENCOUNTER — Ambulatory Visit: Payer: BC Managed Care – PPO

## 2023-01-15 ENCOUNTER — Other Ambulatory Visit: Payer: BC Managed Care – PPO

## 2023-01-15 DIAGNOSIS — Z3A30 30 weeks gestation of pregnancy: Secondary | ICD-10-CM | POA: Diagnosis not present

## 2023-01-15 DIAGNOSIS — O43123 Velamentous insertion of umbilical cord, third trimester: Secondary | ICD-10-CM | POA: Diagnosis not present

## 2023-01-22 ENCOUNTER — Ambulatory Visit: Payer: BC Managed Care – PPO | Attending: Obstetrics & Gynecology | Admitting: Obstetrics

## 2023-01-22 ENCOUNTER — Ambulatory Visit: Payer: BC Managed Care – PPO | Attending: Obstetrics & Gynecology

## 2023-01-22 ENCOUNTER — Ambulatory Visit: Payer: BC Managed Care – PPO | Admitting: *Deleted

## 2023-01-22 ENCOUNTER — Encounter: Payer: Self-pay | Admitting: *Deleted

## 2023-01-22 ENCOUNTER — Other Ambulatory Visit: Payer: Self-pay | Admitting: *Deleted

## 2023-01-22 VITALS — BP 129/69 | HR 90

## 2023-01-22 DIAGNOSIS — Z3A31 31 weeks gestation of pregnancy: Secondary | ICD-10-CM | POA: Diagnosis not present

## 2023-01-22 DIAGNOSIS — R768 Other specified abnormal immunological findings in serum: Secondary | ICD-10-CM | POA: Diagnosis not present

## 2023-01-22 DIAGNOSIS — O361130 Maternal care for Anti-A sensitization, third trimester, not applicable or unspecified: Secondary | ICD-10-CM | POA: Diagnosis not present

## 2023-01-22 DIAGNOSIS — O43123 Velamentous insertion of umbilical cord, third trimester: Secondary | ICD-10-CM

## 2023-01-22 DIAGNOSIS — O361930 Maternal care for other isoimmunization, third trimester, not applicable or unspecified: Secondary | ICD-10-CM

## 2023-01-22 DIAGNOSIS — Z6791 Unspecified blood type, Rh negative: Secondary | ICD-10-CM

## 2023-01-22 DIAGNOSIS — O360130 Maternal care for anti-D [Rh] antibodies, third trimester, not applicable or unspecified: Secondary | ICD-10-CM

## 2023-01-22 DIAGNOSIS — Z9889 Other specified postprocedural states: Secondary | ICD-10-CM

## 2023-01-22 DIAGNOSIS — O3663X0 Maternal care for excessive fetal growth, third trimester, not applicable or unspecified: Secondary | ICD-10-CM

## 2023-01-22 DIAGNOSIS — O09523 Supervision of elderly multigravida, third trimester: Secondary | ICD-10-CM

## 2023-01-22 DIAGNOSIS — O3443 Maternal care for other abnormalities of cervix, third trimester: Secondary | ICD-10-CM

## 2023-01-22 DIAGNOSIS — Z6790 Unspecified blood type, Rh positive: Secondary | ICD-10-CM

## 2023-01-23 NOTE — Progress Notes (Signed)
MFM Note  Tiffany Leonard was seen due to isoimmunization with Fya (Duffy) antibodies.  The patient presented to the MAU in mid May after a fall.  During the workup for the fall, her Kleihauer-Betke test was negative.  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  The anti-Fya antibodies were too weak to titer.  The patient's blood type is AB-.  Her initial antibody screen in January 2024 was negative.  This is her third pregnancy.  She denies receiving a blood transfusion in the past.  She denies any other problems in her current pregnancy.  She has declined all screening tests for fetal aneuploidy in her current pregnancy.  She was informed that the fetal growth and amniotic fluid level were appropriate for her gestational age.   There were no obvious fetal anomalies noted on today's ultrasound exam.  However, today's exam was limited due to her advanced gestational age.  The patient was informed that anomalies may be missed due to technical limitations. If the fetus is in a suboptimal position or maternal habitus is increased, visualization of the fetus in the maternal uterus may be impaired.  The following were discussed during today's consultation:  Type and screen positive for the anti-Fya (Duffy) antibodies  The patient was advised that the anti-Fya (Duffy) antibodies have the potential to cross the placenta and cause fetal anemia should the fetus be positive for the Duffy antigens inherited from the father.  As the patient denies any prior blood transfusions and her initial type and screen in January 2024 was negative for the Duffy antibodies, it is uncertain how she became sensitized.  She was reassured that the peak systolic velocity of the middle cerebral artery measured today was less than 1.5 multiple of the median for her gestational age, indicating that her fetus is not anemic at this time.    There were no signs of fetal hydrops noted on today's exam.   The patient was also advised regarding the availability of the Unity cell free DNA test which may determine if the fetus that she is carrying carries the Duffy antigen on its red blood cells.  She will consider getting this blood test drawn during her future appointments.  The patient was also reassured that as her antibody titer levels are too weak to titer, it is unlikely that there are sufficient antibodies to cause fetal anemia.    Due to isoimmunization, we will continue to follow her with weekly fetal testing and weekly MCA Doppler studies to screen for fetal anemia.  She understands that should fetal anemia be suspected during her future exams, either an intrauterine fetal blood transfusion or delivery may be recommended depending on her gestational age.    She will return in 1 week for BPP and MCA Doppler study.    She stated that all of her questions were answered today.  A total of 45 minutes was spent counseling and coordinating the care for this patient.  Greater than 50% of the time was spent in direct face-to-face contact.

## 2023-01-24 ENCOUNTER — Other Ambulatory Visit: Payer: Self-pay | Admitting: *Deleted

## 2023-01-24 DIAGNOSIS — R7689 Other specified abnormal immunological findings in serum: Secondary | ICD-10-CM

## 2023-01-24 DIAGNOSIS — R768 Other specified abnormal immunological findings in serum: Secondary | ICD-10-CM

## 2023-01-24 DIAGNOSIS — O3663X0 Maternal care for excessive fetal growth, third trimester, not applicable or unspecified: Secondary | ICD-10-CM

## 2023-01-24 DIAGNOSIS — O3440 Maternal care for other abnormalities of cervix, unspecified trimester: Secondary | ICD-10-CM

## 2023-01-24 DIAGNOSIS — Z6791 Unspecified blood type, Rh negative: Secondary | ICD-10-CM

## 2023-01-24 DIAGNOSIS — O43123 Velamentous insertion of umbilical cord, third trimester: Secondary | ICD-10-CM

## 2023-01-24 DIAGNOSIS — O09523 Supervision of elderly multigravida, third trimester: Secondary | ICD-10-CM

## 2023-01-24 DIAGNOSIS — O26899 Other specified pregnancy related conditions, unspecified trimester: Secondary | ICD-10-CM

## 2023-01-29 ENCOUNTER — Encounter: Payer: Self-pay | Admitting: *Deleted

## 2023-01-30 ENCOUNTER — Ambulatory Visit: Payer: BC Managed Care – PPO | Attending: Obstetrics

## 2023-01-30 ENCOUNTER — Ambulatory Visit: Payer: BC Managed Care – PPO | Admitting: *Deleted

## 2023-01-30 VITALS — BP 137/79 | HR 89

## 2023-01-30 DIAGNOSIS — Z6791 Unspecified blood type, Rh negative: Secondary | ICD-10-CM | POA: Insufficient documentation

## 2023-01-30 DIAGNOSIS — R768 Other specified abnormal immunological findings in serum: Secondary | ICD-10-CM | POA: Insufficient documentation

## 2023-01-30 DIAGNOSIS — O26899 Other specified pregnancy related conditions, unspecified trimester: Secondary | ICD-10-CM | POA: Insufficient documentation

## 2023-01-30 DIAGNOSIS — O09523 Supervision of elderly multigravida, third trimester: Secondary | ICD-10-CM | POA: Insufficient documentation

## 2023-01-30 DIAGNOSIS — O403XX0 Polyhydramnios, third trimester, not applicable or unspecified: Secondary | ICD-10-CM

## 2023-01-30 DIAGNOSIS — O3663X0 Maternal care for excessive fetal growth, third trimester, not applicable or unspecified: Secondary | ICD-10-CM | POA: Insufficient documentation

## 2023-01-30 DIAGNOSIS — Z3A32 32 weeks gestation of pregnancy: Secondary | ICD-10-CM

## 2023-01-30 DIAGNOSIS — O3440 Maternal care for other abnormalities of cervix, unspecified trimester: Secondary | ICD-10-CM | POA: Insufficient documentation

## 2023-01-30 DIAGNOSIS — O360130 Maternal care for anti-D [Rh] antibodies, third trimester, not applicable or unspecified: Secondary | ICD-10-CM

## 2023-01-30 DIAGNOSIS — Z9889 Other specified postprocedural states: Secondary | ICD-10-CM | POA: Diagnosis not present

## 2023-01-30 DIAGNOSIS — O3443 Maternal care for other abnormalities of cervix, third trimester: Secondary | ICD-10-CM

## 2023-01-30 DIAGNOSIS — O43123 Velamentous insertion of umbilical cord, third trimester: Secondary | ICD-10-CM | POA: Diagnosis not present

## 2023-02-06 ENCOUNTER — Ambulatory Visit: Payer: BC Managed Care – PPO | Attending: Obstetrics

## 2023-02-06 ENCOUNTER — Ambulatory Visit: Payer: BC Managed Care – PPO | Admitting: *Deleted

## 2023-02-06 VITALS — BP 133/78 | HR 89

## 2023-02-06 DIAGNOSIS — O360130 Maternal care for anti-D [Rh] antibodies, third trimester, not applicable or unspecified: Secondary | ICD-10-CM

## 2023-02-06 DIAGNOSIS — O09523 Supervision of elderly multigravida, third trimester: Secondary | ICD-10-CM

## 2023-02-06 DIAGNOSIS — O26899 Other specified pregnancy related conditions, unspecified trimester: Secondary | ICD-10-CM | POA: Diagnosis not present

## 2023-02-06 DIAGNOSIS — R768 Other specified abnormal immunological findings in serum: Secondary | ICD-10-CM | POA: Diagnosis not present

## 2023-02-06 DIAGNOSIS — O3440 Maternal care for other abnormalities of cervix, unspecified trimester: Secondary | ICD-10-CM | POA: Insufficient documentation

## 2023-02-06 DIAGNOSIS — Z9889 Other specified postprocedural states: Secondary | ICD-10-CM | POA: Insufficient documentation

## 2023-02-06 DIAGNOSIS — O3663X0 Maternal care for excessive fetal growth, third trimester, not applicable or unspecified: Secondary | ICD-10-CM | POA: Diagnosis not present

## 2023-02-06 DIAGNOSIS — Z6791 Unspecified blood type, Rh negative: Secondary | ICD-10-CM | POA: Insufficient documentation

## 2023-02-06 DIAGNOSIS — O403XX0 Polyhydramnios, third trimester, not applicable or unspecified: Secondary | ICD-10-CM

## 2023-02-06 DIAGNOSIS — Z3A33 33 weeks gestation of pregnancy: Secondary | ICD-10-CM

## 2023-02-06 DIAGNOSIS — O43123 Velamentous insertion of umbilical cord, third trimester: Secondary | ICD-10-CM | POA: Diagnosis not present

## 2023-02-06 DIAGNOSIS — O09293 Supervision of pregnancy with other poor reproductive or obstetric history, third trimester: Secondary | ICD-10-CM

## 2023-02-12 ENCOUNTER — Ambulatory Visit: Payer: BC Managed Care – PPO

## 2023-02-12 ENCOUNTER — Other Ambulatory Visit: Payer: BC Managed Care – PPO

## 2023-02-13 ENCOUNTER — Ambulatory Visit: Payer: BC Managed Care – PPO | Admitting: *Deleted

## 2023-02-13 ENCOUNTER — Ambulatory Visit: Payer: BC Managed Care – PPO | Attending: Obstetrics

## 2023-02-13 ENCOUNTER — Other Ambulatory Visit: Payer: Self-pay | Admitting: *Deleted

## 2023-02-13 VITALS — BP 128/73 | HR 90

## 2023-02-13 DIAGNOSIS — O09523 Supervision of elderly multigravida, third trimester: Secondary | ICD-10-CM | POA: Diagnosis not present

## 2023-02-13 DIAGNOSIS — O3440 Maternal care for other abnormalities of cervix, unspecified trimester: Secondary | ICD-10-CM | POA: Insufficient documentation

## 2023-02-13 DIAGNOSIS — O403XX0 Polyhydramnios, third trimester, not applicable or unspecified: Secondary | ICD-10-CM

## 2023-02-13 DIAGNOSIS — R768 Other specified abnormal immunological findings in serum: Secondary | ICD-10-CM

## 2023-02-13 DIAGNOSIS — O3663X0 Maternal care for excessive fetal growth, third trimester, not applicable or unspecified: Secondary | ICD-10-CM | POA: Diagnosis not present

## 2023-02-13 DIAGNOSIS — O26899 Other specified pregnancy related conditions, unspecified trimester: Secondary | ICD-10-CM | POA: Insufficient documentation

## 2023-02-13 DIAGNOSIS — O43123 Velamentous insertion of umbilical cord, third trimester: Secondary | ICD-10-CM | POA: Insufficient documentation

## 2023-02-13 DIAGNOSIS — Z6791 Unspecified blood type, Rh negative: Secondary | ICD-10-CM | POA: Diagnosis not present

## 2023-02-13 DIAGNOSIS — O360130 Maternal care for anti-D [Rh] antibodies, third trimester, not applicable or unspecified: Secondary | ICD-10-CM | POA: Diagnosis not present

## 2023-02-13 DIAGNOSIS — Z9889 Other specified postprocedural states: Secondary | ICD-10-CM | POA: Insufficient documentation

## 2023-02-13 DIAGNOSIS — Z3A34 34 weeks gestation of pregnancy: Secondary | ICD-10-CM

## 2023-02-13 DIAGNOSIS — Z86718 Personal history of other venous thrombosis and embolism: Secondary | ICD-10-CM

## 2023-02-13 DIAGNOSIS — O3443 Maternal care for other abnormalities of cervix, third trimester: Secondary | ICD-10-CM

## 2023-02-13 DIAGNOSIS — R769 Abnormal immunological finding in serum, unspecified: Secondary | ICD-10-CM | POA: Diagnosis not present

## 2023-02-19 ENCOUNTER — Ambulatory Visit: Payer: BC Managed Care – PPO | Admitting: *Deleted

## 2023-02-19 ENCOUNTER — Other Ambulatory Visit: Payer: Self-pay | Admitting: *Deleted

## 2023-02-19 ENCOUNTER — Ambulatory Visit: Payer: BC Managed Care – PPO | Attending: Obstetrics

## 2023-02-19 ENCOUNTER — Other Ambulatory Visit: Payer: Self-pay | Admitting: Obstetrics

## 2023-02-19 VITALS — BP 126/76 | HR 81

## 2023-02-19 DIAGNOSIS — O09523 Supervision of elderly multigravida, third trimester: Secondary | ICD-10-CM

## 2023-02-19 DIAGNOSIS — O3440 Maternal care for other abnormalities of cervix, unspecified trimester: Secondary | ICD-10-CM | POA: Insufficient documentation

## 2023-02-19 DIAGNOSIS — O403XX0 Polyhydramnios, third trimester, not applicable or unspecified: Secondary | ICD-10-CM

## 2023-02-19 DIAGNOSIS — R768 Other specified abnormal immunological findings in serum: Secondary | ICD-10-CM | POA: Insufficient documentation

## 2023-02-19 DIAGNOSIS — O3443 Maternal care for other abnormalities of cervix, third trimester: Secondary | ICD-10-CM

## 2023-02-19 DIAGNOSIS — O26899 Other specified pregnancy related conditions, unspecified trimester: Secondary | ICD-10-CM

## 2023-02-19 DIAGNOSIS — Z9889 Other specified postprocedural states: Secondary | ICD-10-CM

## 2023-02-19 DIAGNOSIS — R7689 Other specified abnormal immunological findings in serum: Secondary | ICD-10-CM

## 2023-02-19 DIAGNOSIS — Z86718 Personal history of other venous thrombosis and embolism: Secondary | ICD-10-CM

## 2023-02-19 DIAGNOSIS — O43123 Velamentous insertion of umbilical cord, third trimester: Secondary | ICD-10-CM | POA: Diagnosis not present

## 2023-02-19 DIAGNOSIS — Z6791 Unspecified blood type, Rh negative: Secondary | ICD-10-CM | POA: Insufficient documentation

## 2023-02-19 DIAGNOSIS — O3663X0 Maternal care for excessive fetal growth, third trimester, not applicable or unspecified: Secondary | ICD-10-CM | POA: Insufficient documentation

## 2023-02-19 DIAGNOSIS — O360130 Maternal care for anti-D [Rh] antibodies, third trimester, not applicable or unspecified: Secondary | ICD-10-CM | POA: Diagnosis not present

## 2023-02-19 DIAGNOSIS — Z3A35 35 weeks gestation of pregnancy: Secondary | ICD-10-CM

## 2023-02-26 ENCOUNTER — Ambulatory Visit: Payer: BC Managed Care – PPO | Attending: Maternal & Fetal Medicine

## 2023-02-26 ENCOUNTER — Ambulatory Visit: Payer: BC Managed Care – PPO | Admitting: *Deleted

## 2023-02-26 DIAGNOSIS — R030 Elevated blood-pressure reading, without diagnosis of hypertension: Secondary | ICD-10-CM | POA: Diagnosis not present

## 2023-02-26 DIAGNOSIS — O09523 Supervision of elderly multigravida, third trimester: Secondary | ICD-10-CM | POA: Diagnosis not present

## 2023-02-26 DIAGNOSIS — O26899 Other specified pregnancy related conditions, unspecified trimester: Secondary | ICD-10-CM | POA: Insufficient documentation

## 2023-02-26 DIAGNOSIS — Z6791 Unspecified blood type, Rh negative: Secondary | ICD-10-CM | POA: Diagnosis not present

## 2023-02-26 DIAGNOSIS — Z86718 Personal history of other venous thrombosis and embolism: Secondary | ICD-10-CM | POA: Insufficient documentation

## 2023-02-26 DIAGNOSIS — O2233 Deep phlebothrombosis in pregnancy, third trimester: Secondary | ICD-10-CM | POA: Diagnosis not present

## 2023-02-26 DIAGNOSIS — O360130 Maternal care for anti-D [Rh] antibodies, third trimester, not applicable or unspecified: Secondary | ICD-10-CM | POA: Diagnosis not present

## 2023-02-26 DIAGNOSIS — Z369 Encounter for antenatal screening, unspecified: Secondary | ICD-10-CM | POA: Diagnosis not present

## 2023-02-26 DIAGNOSIS — O43123 Velamentous insertion of umbilical cord, third trimester: Secondary | ICD-10-CM | POA: Insufficient documentation

## 2023-02-26 DIAGNOSIS — O3663X0 Maternal care for excessive fetal growth, third trimester, not applicable or unspecified: Secondary | ICD-10-CM

## 2023-02-26 DIAGNOSIS — R768 Other specified abnormal immunological findings in serum: Secondary | ICD-10-CM | POA: Diagnosis not present

## 2023-02-26 DIAGNOSIS — O3443 Maternal care for other abnormalities of cervix, third trimester: Secondary | ICD-10-CM

## 2023-02-26 DIAGNOSIS — Z3A36 36 weeks gestation of pregnancy: Secondary | ICD-10-CM

## 2023-02-26 LAB — OB RESULTS CONSOLE GBS: GBS: NEGATIVE

## 2023-03-05 ENCOUNTER — Encounter (HOSPITAL_COMMUNITY): Payer: Self-pay

## 2023-03-05 ENCOUNTER — Telehealth (HOSPITAL_COMMUNITY): Payer: Self-pay | Admitting: *Deleted

## 2023-03-05 NOTE — Telephone Encounter (Signed)
Preadmission screen  

## 2023-03-06 ENCOUNTER — Ambulatory Visit: Payer: BC Managed Care – PPO | Attending: Maternal & Fetal Medicine

## 2023-03-06 ENCOUNTER — Telehealth (HOSPITAL_COMMUNITY): Payer: Self-pay | Admitting: *Deleted

## 2023-03-06 ENCOUNTER — Ambulatory Visit: Payer: BC Managed Care – PPO

## 2023-03-06 DIAGNOSIS — R768 Other specified abnormal immunological findings in serum: Secondary | ICD-10-CM

## 2023-03-06 DIAGNOSIS — O360130 Maternal care for anti-D [Rh] antibodies, third trimester, not applicable or unspecified: Secondary | ICD-10-CM | POA: Diagnosis not present

## 2023-03-06 DIAGNOSIS — O3663X0 Maternal care for excessive fetal growth, third trimester, not applicable or unspecified: Secondary | ICD-10-CM

## 2023-03-06 DIAGNOSIS — O09523 Supervision of elderly multigravida, third trimester: Secondary | ICD-10-CM | POA: Diagnosis not present

## 2023-03-06 DIAGNOSIS — O3443 Maternal care for other abnormalities of cervix, third trimester: Secondary | ICD-10-CM | POA: Diagnosis not present

## 2023-03-06 DIAGNOSIS — Z3A37 37 weeks gestation of pregnancy: Secondary | ICD-10-CM

## 2023-03-06 DIAGNOSIS — O2233 Deep phlebothrombosis in pregnancy, third trimester: Secondary | ICD-10-CM

## 2023-03-06 DIAGNOSIS — O43123 Velamentous insertion of umbilical cord, third trimester: Secondary | ICD-10-CM | POA: Diagnosis not present

## 2023-03-06 NOTE — Telephone Encounter (Signed)
Preadmission screen  

## 2023-03-11 ENCOUNTER — Inpatient Hospital Stay (HOSPITAL_COMMUNITY)
Admission: RE | Admit: 2023-03-11 | Discharge: 2023-03-12 | DRG: 807 | Disposition: A | Discharge status: Home / Self Care | Payer: BC Managed Care – PPO | Attending: Obstetrics and Gynecology | Admitting: Obstetrics and Gynecology

## 2023-03-11 ENCOUNTER — Inpatient Hospital Stay (HOSPITAL_COMMUNITY): Payer: BC Managed Care – PPO

## 2023-03-11 ENCOUNTER — Other Ambulatory Visit: Payer: Self-pay

## 2023-03-11 ENCOUNTER — Encounter (HOSPITAL_COMMUNITY): Payer: Self-pay | Admitting: Obstetrics and Gynecology

## 2023-03-11 DIAGNOSIS — Z96649 Presence of unspecified artificial hip joint: Secondary | ICD-10-CM | POA: Diagnosis not present

## 2023-03-11 DIAGNOSIS — O134 Gestational [pregnancy-induced] hypertension without significant proteinuria, complicating childbirth: Secondary | ICD-10-CM | POA: Diagnosis not present

## 2023-03-11 DIAGNOSIS — Z23 Encounter for immunization: Secondary | ICD-10-CM | POA: Diagnosis not present

## 2023-03-11 DIAGNOSIS — O26893 Other specified pregnancy related conditions, third trimester: Secondary | ICD-10-CM | POA: Diagnosis not present

## 2023-03-11 DIAGNOSIS — O43123 Velamentous insertion of umbilical cord, third trimester: Secondary | ICD-10-CM | POA: Diagnosis not present

## 2023-03-11 DIAGNOSIS — O09219 Supervision of pregnancy with history of pre-term labor, unspecified trimester: Principal | ICD-10-CM

## 2023-03-11 DIAGNOSIS — Z3A38 38 weeks gestation of pregnancy: Secondary | ICD-10-CM | POA: Diagnosis not present

## 2023-03-11 DIAGNOSIS — O403XX0 Polyhydramnios, third trimester, not applicable or unspecified: Secondary | ICD-10-CM | POA: Diagnosis present

## 2023-03-11 LAB — CBC
HCT: 32.6 % — ABNORMAL LOW (ref 36.0–46.0)
Hemoglobin: 10.7 g/dL — ABNORMAL LOW (ref 12.0–15.0)
MCH: 26.8 pg (ref 26.0–34.0)
MCHC: 32.8 g/dL (ref 30.0–36.0)
MCV: 81.5 fL (ref 80.0–100.0)
Platelets: 269 10*3/uL (ref 150–400)
RBC: 4 MIL/uL (ref 3.87–5.11)
RDW: 14.3 % (ref 11.5–15.5)
WBC: 8.5 10*3/uL (ref 4.0–10.5)
nRBC: 0 % (ref 0.0–0.2)

## 2023-03-11 LAB — TYPE AND SCREEN
ABO/RH(D): AB NEG
Antibody Screen: POSITIVE
Donor AG Type: NEGATIVE
Donor AG Type: NEGATIVE
Unit division: 0
Unit division: 0

## 2023-03-11 LAB — BPAM RBC
Blood Product Expiration Date: 202408162359
Blood Product Expiration Date: 202408162359
Unit Type and Rh: 600
Unit Type and Rh: 600

## 2023-03-11 MED ORDER — OXYTOCIN-SODIUM CHLORIDE 30-0.9 UT/500ML-% IV SOLN
1.0000 m[IU]/min | INTRAVENOUS | Status: DC
  Administered 2023-03-11: 2 m[IU]/min via INTRAVENOUS

## 2023-03-11 MED ORDER — MEDROXYPROGESTERONE ACETATE 150 MG/ML IM SUSP: 150.0000 mg | INTRAMUSCULAR | Status: DC | PRN

## 2023-03-11 MED ORDER — LACTATED RINGERS IV SOLN: INTRAVENOUS | Status: DC

## 2023-03-11 MED ORDER — OXYCODONE-ACETAMINOPHEN 5-325 MG PO TABS: 1.0000 | ORAL_TABLET | ORAL | Status: DC | PRN

## 2023-03-11 MED ORDER — OXYTOCIN-SODIUM CHLORIDE 30-0.9 UT/500ML-% IV SOLN
2.5000 [IU]/h | INTRAVENOUS | Status: DC
  Filled 2023-03-11: qty 500

## 2023-03-11 MED ORDER — OXYCODONE-ACETAMINOPHEN 5-325 MG PO TABS: 2.0000 | ORAL_TABLET | ORAL | Status: DC | PRN

## 2023-03-11 MED ORDER — SERTRALINE HCL 100 MG PO TABS: 100.0000 mg | ORAL_TABLET | Freq: Every day | ORAL | Status: DC

## 2023-03-11 MED ORDER — ONDANSETRON HCL 4 MG/2ML IJ SOLN: 4.0000 mg | INTRAMUSCULAR | Status: DC | PRN

## 2023-03-11 MED ORDER — ACETAMINOPHEN 325 MG PO TABS: 650.0000 mg | ORAL_TABLET | ORAL | Status: DC | PRN

## 2023-03-11 MED ORDER — SENNOSIDES-DOCUSATE SODIUM 8.6-50 MG PO TABS
2.0000 | ORAL_TABLET | Freq: Every day | ORAL | Status: DC
  Administered 2023-03-12: 2 via ORAL
  Filled 2023-03-11: qty 2

## 2023-03-11 MED ORDER — SIMETHICONE 80 MG PO CHEW: 80.0000 mg | CHEWABLE_TABLET | ORAL | Status: DC | PRN

## 2023-03-11 MED ORDER — SOD CITRATE-CITRIC ACID 500-334 MG/5ML PO SOLN: 30.0000 mL | ORAL | Status: DC | PRN

## 2023-03-11 MED ORDER — LACTATED RINGERS IV SOLN: 500.0000 mL | INTRAVENOUS | Status: DC | PRN

## 2023-03-11 MED ORDER — ACETAMINOPHEN 325 MG PO TABS
650.0000 mg | ORAL_TABLET | ORAL | Status: DC | PRN
  Administered 2023-03-11: 650 mg via ORAL
  Filled 2023-03-11 (×2): qty 2

## 2023-03-11 MED ORDER — PRENATAL MULTIVITAMIN CH
1.0000 | ORAL_TABLET | Freq: Every day | ORAL | Status: DC
  Administered 2023-03-12: 1 via ORAL
  Filled 2023-03-11: qty 1

## 2023-03-11 MED ORDER — TETANUS-DIPHTH-ACELL PERTUSSIS 5-2.5-18.5 LF-MCG/0.5 IM SUSY: 0.5000 mL | PREFILLED_SYRINGE | Freq: Once | INTRAMUSCULAR | Status: DC

## 2023-03-11 MED ORDER — FENTANYL CITRATE (PF) 100 MCG/2ML IJ SOLN: 50.0000 ug | INTRAMUSCULAR | Status: DC | PRN

## 2023-03-11 MED ORDER — COCONUT OIL OIL: 1.0000 | TOPICAL_OIL | Status: DC | PRN

## 2023-03-11 MED ORDER — ONDANSETRON HCL 4 MG PO TABS: 4.0000 mg | ORAL_TABLET | ORAL | Status: DC | PRN

## 2023-03-11 MED ORDER — TERBUTALINE SULFATE 1 MG/ML IJ SOLN: 0.2500 mg | Freq: Once | INTRAMUSCULAR | Status: DC | PRN

## 2023-03-11 MED ORDER — OXYTOCIN BOLUS FROM INFUSION
333.0000 mL | Freq: Once | INTRAVENOUS | Status: AC
  Administered 2023-03-11: 333 mL via INTRAVENOUS

## 2023-03-11 MED ORDER — ZOLPIDEM TARTRATE 5 MG PO TABS: 5.0000 mg | ORAL_TABLET | Freq: Every evening | ORAL | Status: DC | PRN

## 2023-03-11 MED ORDER — BENZOCAINE-MENTHOL 20-0.5 % EX AERO: 1.0000 | INHALATION_SPRAY | CUTANEOUS | Status: DC | PRN

## 2023-03-11 MED ORDER — MEASLES, MUMPS & RUBELLA VAC IJ SOLR: 0.5000 mL | Freq: Once | INTRAMUSCULAR | Status: DC

## 2023-03-11 MED ORDER — IBUPROFEN 600 MG PO TABS
600.0000 mg | ORAL_TABLET | Freq: Four times a day (QID) | ORAL | Status: DC
  Administered 2023-03-11 – 2023-03-12 (×4): 600 mg via ORAL
  Filled 2023-03-11 (×3): qty 1

## 2023-03-11 MED ORDER — DIBUCAINE (PERIANAL) 1 % EX OINT: 1.0000 | TOPICAL_OINTMENT | CUTANEOUS | Status: DC | PRN

## 2023-03-11 MED ORDER — WITCH HAZEL-GLYCERIN EX PADS: 1.0000 | MEDICATED_PAD | CUTANEOUS | Status: DC | PRN

## 2023-03-11 MED ORDER — LIDOCAINE HCL (PF) 1 % IJ SOLN
30.0000 mL | INTRAMUSCULAR | Status: AC | PRN
  Administered 2023-03-11: 30 mL via SUBCUTANEOUS
  Filled 2023-03-11: qty 30

## 2023-03-11 MED ORDER — ONDANSETRON HCL 4 MG/2ML IJ SOLN: 4.0000 mg | Freq: Four times a day (QID) | INTRAMUSCULAR | Status: DC | PRN

## 2023-03-11 MED ORDER — DIPHENHYDRAMINE HCL 25 MG PO CAPS: 25.0000 mg | ORAL_CAPSULE | Freq: Four times a day (QID) | ORAL | Status: DC | PRN

## 2023-03-11 NOTE — H&P (Signed)
Tiffany Leonard is a 37 y.o. female presenting for induction of labor due to hx of precipitous delivery x 2.  This pregnancy complicated by duffy ab too weak to titre and followed by MFM.  Normal growth and BPPs.  Polyhydraminos.  Velmentous cord insertion away from cervix and elevate BPs c/w mild Gestational HTN.  GBS negative . OB History     Gravida  3   Para  2   Term  2   Preterm      AB      Living  2      SAB      IAB      Ectopic      Multiple  0   Live Births  2          Past Medical History:  Diagnosis Date   Anxiety    Arthritis    Encounter for supervision of normal first pregnancy in third trimester 12/15/2019   Headache    Hip fracture (HCC) 06/16/2013   History of removal of joint prosthesis of hip due to infection 06/05/2022   Medication monitoring encounter 09/23/2020   Osteomyelitis of right femur (HCC) 02/14/2021   PICC (peripherally inserted central catheter) in place 12/25/2021   Pregnancy 12/16/2019   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/21/2021   Septic arthritis of hip (HCC) 09/11/2020   Vaginal Pap smear, abnormal    Past Surgical History:  Procedure Laterality Date   broken pelvis  2014   EXCISIONAL TOTAL HIP ARTHROPLASTY WITH ANTIBIOTIC SPACERS Right 01/01/2021   Procedure: Right hip resection arthroplasty with antibiotic spacer;  Surgeon: Ollen Gross, MD;  Location: [facility];  Service: Orthopedics;  Laterality: Right;    INCISION AND DRAINAGE HIP Right 09/11/2020   Procedure: Right hip arthrotomy, irrigation and debridement-anterior approach;  Surgeon: Ollen Gross, MD;  Location: [facility];  Service: Orthopedics;  Laterality: Right;    REIMPLANTATION OF TOTAL HIP Right 11/21/2021   Procedure: REIMPLANTATION OF TOTAL HIP;  Surgeon: Ollen Gross, MD;  Location: [facility];  Service: Orthopedics;  Laterality: Right;   REIMPLANTATION OF TOTAL HIP Right 06/05/2022   Procedure: REIMPLANTATION OF TOTAL HIP-POSTERIOR;   Surgeon: Ollen Gross, MD;  Location: [facility];  Service: Orthopedics;  Laterality: Right;   SHOULDER SURGERY  2014   Family History: family history includes Cancer in her father; Lupus in her mother. Social History:  reports that she has never smoked. She has never used smokeless tobacco. She reports that she does not currently use alcohol. She reports that she does not use drugs.     Maternal Diabetes: No Genetic Screening: Normal Maternal Ultrasounds/Referrals: Normal Fetal Ultrasounds or other Referrals:  Referred to Materal Fetal Medicine  Maternal Substance Abuse:  No Significant Maternal Medications:  None Significant Maternal Lab Results:  Group B Strep negative Number of Prenatal Visits:greater than 3 verified prenatal visits Other Comments:  None  Review of Systems History Vitals and nursing note reviewed. Exam conducted with a chaperone present.  Constitutional:      Appearance: Normal appearance.  HENT:     Head: Normocephalic.  Eyes:     Pupils: Pupils are equal, round, and reactive to light.  Cardiovascular:     Rate and Rhythm: Normal rate and regular rhythm.     Pulses: Normal pulses.  Abdominal:     General: Abdomen is Gravid, nontender Neurological:     Mental Status: She is alert. Dilation: 4 Effacement (%): 70, 80 Station: Ballotable Exam by::  h stone rnc Blood pressure 132/84, pulse 97, temperature 98.2 F (36.8 C), temperature source Oral, resp. rate 18, height 5\' 10"  (1.778 m), weight 97.8 kg, last menstrual period 06/15/2022, unknown if currently breastfeeding. Exam Physical Exam  BP 132/84 Prenatal labs: ABO, Rh: --/--/PENDING (07/23 0827) Antibody: PENDING (07/23 0827) Rubella: Immune (01/04 0000) RPR: Nonreactive (01/04 0000)  HBsAg: Negative (01/04 0000)  HIV: Non-reactive (01/04 0000)  GBS: Negative/-- (07/10 0000)   Assessment/Plan: IUP at 38 weeks Favorable cervix and hx of in hospital precipitous deliveries x 2  Pitocin IOL.  Eval  at [facility] or consider careful AROM after fetal vertex engaged.  US shows Vtx presentation GHTN - normal BPs today and no PIH sxs  [name] 03/11/2023, 9:20 AM

## 2023-03-11 NOTE — Lactation Note (Signed)
This note was copied from a baby's chart. Lactation Consultation Note  Patient Name: Tiffany Leonard [id number] Date: 03/11/2023 Age:36 hours  Per RN, mother states this is her 3rd baby and she has breastfeed all of them. She denies any questions or concerns and declines need for lactation consultant.   Consult Status Consult Status: Complete (mother declined follow up) (3rd baby, experienced BF mother, going well, declines need for lc)    Christella Hartigan M 03/11/2023, 4:44 PM

## 2023-03-12 LAB — RPR: RPR Ser Ql: NONREACTIVE

## 2023-03-12 MED ORDER — ACETAMINOPHEN 325 MG PO TABS: 650.0000 mg | ORAL_TABLET | ORAL | 0 refills | Status: AC | PRN

## 2023-03-12 MED ORDER — IBUPROFEN 600 MG PO TABS: 600.0000 mg | ORAL_TABLET | Freq: Four times a day (QID) | ORAL | 0 refills | Status: AC

## 2023-03-12 NOTE — Progress Notes (Addendum)
Per Dr. Rana Snare, will continue to monitor BP's

## 2023-03-12 NOTE — Progress Notes (Signed)
Postpartum Progress Note  Post Partum Day 1 s/p spontaneous vaginal delivery.  Patient reports well-controlled pain, ambulating without difficulty, voiding spontaneously, tolerating PO.  Vaginal bleeding is appropriate.   Objective: Blood pressure 127/81, pulse 95, temperature 98.3 F (36.8 C), temperature source Oral, resp. rate 18, height 5\' 10"  (1.778 m), weight 97.8 kg, last menstrual period 06/15/2022, SpO2 100%, unknown if currently breastfeeding.  Physical Exam:  General: alert and no distress Lochia: appropriate Uterine Fundus: firm DVT Evaluation: No evidence of DVT seen on physical exam.  Recent Labs    03/11/23 0827  HGB 10.7*  HCT 32.6*    Assessment/Plan: Postpartum Day 1, s/p vaginal delivery. gHTN:  Continue routine postpartum care Lactation following Anticipate discharge home later today   LOS: 1 day   Lyn Henri 03/12/2023, 7:45 AM

## 2023-03-13 ENCOUNTER — Ambulatory Visit: Payer: BC Managed Care – PPO

## 2023-03-13 NOTE — Discharge Summary (Signed)
Obstetric Discharge Summary  Marji Kuehnel is a 37 y.o. female that presented on 03/11/2023 for induction of labor, history of precipitous delivery.  Her labor course was uncomplicated and she delivered a viable female infant on 03/11/23.  Her postpartum course was uncomplicated and on PPD#1, she reported well controlled pain, spontaneous voiding, ambulating without difficulty, and tolerating PO.  She was stable for discharge home on 03/12/23 with plans for in-office follow up.  Hemoglobin  Date Value Ref Range Status  03/11/2023 10.7 (L) 12.0 - 15.0 g/dL Final   HCT  Date Value Ref Range Status  03/11/2023 32.6 (L) 36.0 - 46.0 % Final    Physical Exam:  General: alert and no distress Lochia: appropriate Uterine Fundus: firm DVT Evaluation: No evidence of DVT seen on physical exam.  Discharge Diagnoses: Term Pregnancy-delivered  Discharge Information: Date: 03/13/2023 Activity: Pelvic rest, as tolerated Diet: routine Medications: Tylenol, motrin Condition: stable Instructions: Refer to practice specific booklet.  Discussed prior to discharge.  Discharge to: Home  Follow-up Information     [place] Follow up.   Why: Please follow up for a 6 week postpartum visit. Contact information: [address] [place] [address] [phone]                 Newborn Data: Live born female  Birth Weight: 7 lb 9 oz (3430 g) APGAR: 4, 9  Newborn Delivery   Birth date/time: 03/11/2023 12:42:00 Delivery type: Vaginal, Spontaneous     Home with mother.  Lyn Henri 03/13/2023, 12:29 AM

## 2023-04-07 ENCOUNTER — Telehealth (HOSPITAL_COMMUNITY): Payer: Self-pay

## 2023-04-07 NOTE — Telephone Encounter (Signed)
04/07/2023 1259  Name: Tiffany Leonard MRN: [id number] DOB: 07-25-1986  Reason for Call:  Transition of Care Hospital Discharge Call  Contact Status: Patient Contact Status: Message  Language assistant needed: Interpreter Mode: Interpreter Not Needed        Follow-Up Questions:    Inocente Salles Postnatal Depression Scale:  In the Past 7 Days:    PHQ2-9 Depression Scale:     Discharge Follow-up:    Post-discharge interventions: NA  Signature  Signe Colt

## 2023-04-30 DIAGNOSIS — Z1389 Encounter for screening for other disorder: Secondary | ICD-10-CM | POA: Diagnosis not present

## 2023-05-27 DIAGNOSIS — J029 Acute pharyngitis, unspecified: Secondary | ICD-10-CM | POA: Diagnosis not present

## 2023-06-04 ENCOUNTER — Other Ambulatory Visit: Payer: Self-pay | Admitting: Radiology

## 2023-06-04 DIAGNOSIS — R4589 Other symptoms and signs involving emotional state: Secondary | ICD-10-CM

## 2023-06-04 NOTE — Telephone Encounter (Signed)
Needs AEX

## 2023-06-04 NOTE — Telephone Encounter (Signed)
Med refill request: zoloft 100mg  #90 Last Office visit:  11/13/21 Next AEX: none scheduled Last MMG (if hormonal med) n/a Refill sent to provider for approval or denial.

## 2023-11-25 DIAGNOSIS — Z3202 Encounter for pregnancy test, result negative: Secondary | ICD-10-CM | POA: Diagnosis not present

## 2023-11-25 DIAGNOSIS — Z30430 Encounter for insertion of intrauterine contraceptive device: Secondary | ICD-10-CM | POA: Diagnosis not present

## 2023-12-09 ENCOUNTER — Other Ambulatory Visit: Payer: Self-pay | Admitting: Radiology

## 2023-12-09 DIAGNOSIS — R4589 Other symptoms and signs involving emotional state: Secondary | ICD-10-CM

## 2023-12-09 NOTE — Telephone Encounter (Signed)
 Med refill request: sertraline  100 mg Last OV: 11/13/21 Mirena  removal Last AEX: none Next AEX: none scheduled Last MMG (if hormonal med) n/a Refill authorized: sertraline  100 mg Needs appointment. Please approve or deny as appropriate.

## 2023-12-31 DIAGNOSIS — L03113 Cellulitis of right upper limb: Secondary | ICD-10-CM | POA: Diagnosis not present

## 2023-12-31 DIAGNOSIS — L989 Disorder of the skin and subcutaneous tissue, unspecified: Secondary | ICD-10-CM | POA: Diagnosis not present

## 2024-01-05 DIAGNOSIS — Z30431 Encounter for routine checking of intrauterine contraceptive device: Secondary | ICD-10-CM | POA: Diagnosis not present
